# Patient Record
Sex: Male | Born: 1940 | Race: Black or African American | Hispanic: No | Marital: Married | Smoking: Former smoker
Health system: Southern US, Community
[De-identification: ages and names within clinical notes are randomized; demographics above are authoritative.]

## PROBLEM LIST (undated history)

## (undated) DIAGNOSIS — N1330 Unspecified hydronephrosis: Secondary | ICD-10-CM

## (undated) DIAGNOSIS — I10 Essential (primary) hypertension: Secondary | ICD-10-CM

## (undated) DIAGNOSIS — E215 Disorder of parathyroid gland, unspecified: Secondary | ICD-10-CM

## (undated) DIAGNOSIS — M109 Gout, unspecified: Secondary | ICD-10-CM

## (undated) DIAGNOSIS — R0600 Dyspnea, unspecified: Secondary | ICD-10-CM

## (undated) DIAGNOSIS — N189 Chronic kidney disease, unspecified: Secondary | ICD-10-CM

## (undated) DIAGNOSIS — C801 Malignant (primary) neoplasm, unspecified: Secondary | ICD-10-CM

## (undated) DIAGNOSIS — Z87442 Personal history of urinary calculi: Secondary | ICD-10-CM

## (undated) DIAGNOSIS — Z8546 Personal history of malignant neoplasm of prostate: Secondary | ICD-10-CM

## (undated) DIAGNOSIS — J45909 Unspecified asthma, uncomplicated: Secondary | ICD-10-CM

## (undated) DIAGNOSIS — D510 Vitamin B12 deficiency anemia due to intrinsic factor deficiency: Secondary | ICD-10-CM

## (undated) DIAGNOSIS — N35919 Unspecified urethral stricture, male, unspecified site: Secondary | ICD-10-CM

## (undated) DIAGNOSIS — K219 Gastro-esophageal reflux disease without esophagitis: Secondary | ICD-10-CM

## (undated) HISTORY — PX: MULTIPLE TOOTH EXTRACTIONS: SHX2053

## (undated) HISTORY — PX: KNEE SURGERY: SHX244

## (undated) HISTORY — PX: COLONOSCOPY: SHX5424

## (undated) HISTORY — PX: OTHER SURGICAL HISTORY: SHX169

---

## 1997-11-22 ENCOUNTER — Other Ambulatory Visit: Admission: RE | Admit: 1997-11-22 | Discharge: 1997-11-22 | Payer: Self-pay | Admitting: Urology

## 1998-01-04 HISTORY — PX: OTHER SURGICAL HISTORY: SHX169

## 1998-01-06 ENCOUNTER — Encounter: Admission: RE | Admit: 1998-01-06 | Discharge: 1998-04-06 | Payer: Self-pay | Admitting: Urology

## 1998-03-28 ENCOUNTER — Ambulatory Visit (HOSPITAL_BASED_OUTPATIENT_CLINIC_OR_DEPARTMENT_OTHER): Admission: RE | Admit: 1998-03-28 | Discharge: 1998-03-28 | Payer: Self-pay | Admitting: Urology

## 1998-05-02 ENCOUNTER — Ambulatory Visit (HOSPITAL_BASED_OUTPATIENT_CLINIC_OR_DEPARTMENT_OTHER): Admission: RE | Admit: 1998-05-02 | Discharge: 1998-05-02 | Payer: Self-pay | Admitting: Urology

## 1998-05-02 ENCOUNTER — Encounter: Payer: Self-pay | Admitting: Urology

## 1998-05-27 ENCOUNTER — Encounter: Admission: RE | Admit: 1998-05-27 | Discharge: 1998-06-05 | Payer: Self-pay | Admitting: Radiation Oncology

## 1998-05-27 ENCOUNTER — Encounter: Payer: Self-pay | Admitting: Urology

## 2000-08-26 ENCOUNTER — Emergency Department (HOSPITAL_COMMUNITY): Admission: EM | Admit: 2000-08-26 | Discharge: 2000-08-26 | Payer: Self-pay

## 2004-10-06 ENCOUNTER — Ambulatory Visit (HOSPITAL_COMMUNITY): Admission: RE | Admit: 2004-10-06 | Discharge: 2004-10-06 | Payer: Self-pay | Admitting: Gastroenterology

## 2007-09-14 ENCOUNTER — Encounter: Payer: Self-pay | Admitting: Urology

## 2007-09-14 ENCOUNTER — Ambulatory Visit (HOSPITAL_BASED_OUTPATIENT_CLINIC_OR_DEPARTMENT_OTHER): Admission: RE | Admit: 2007-09-14 | Discharge: 2007-09-14 | Payer: Self-pay | Admitting: Urology

## 2007-09-14 HISTORY — PX: OTHER SURGICAL HISTORY: SHX169

## 2010-05-19 NOTE — Op Note (Signed)
[name], Wise                 ACCOUNT NO.:  [id number]   MEDICAL RECORD NO.:  [id number]          PATIENT TYPE:  AMB   LOCATION:  [facility]                         FACILITY:  [facility]   PHYSICIAN:  Excell Seltzer. Annabell Howells, M.D.    DATE OF BIRTH:  11/08/1940   DATE OF PROCEDURE:  09/14/2007  DATE OF DISCHARGE:                               OPERATIVE REPORT   PROCEDURE:  1. Cystoscopy with balloon dilation of urethral stricture.  2. Bladder biopsy with fulguration.   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   POSTOPERATIVE DIAGNOSIS:  Urethral stricture with erythematous lesion on  the left lateral bladder wall.   SURGEON:  Excell Seltzer. Annabell Howells, M.D.   ANESTHESIA:  General.   SPECIMEN:  Bladder biopsies from left lateral wall.   DRAIN:  None.   COMPLICATIONS:  None.   INDICATIONS:  Jerry Wise is a 70 year old African American male with a  history of prostate cancer with prior seed implantation who has had  progressive voiding difficulties.  He has had a prior urethral stricture  that required management and recently was diagnosed with infection and  remains on amoxicillin.  Office evaluation suggested a possible mass at  the bladder neck that was either prostate versus bladder tumor.  It was  felt that cystoscopy, urethral dilation and possible bladder biopsy were  indicated.   FINDINGS AND PROCEDURE:  The patient had been on amoxicillin and was  given Cipro.  He was taken to the operating room where general  anesthetic was induced.  He was placed in the lithotomy position.  His  perineum and genitalia were prepped with Betadine solution.  He was  draped in the usual sterile fashion.  Cystoscopy was performed using a  22 [place] scope.  Examination revealed normal anterior urethra but in  the bulb there was a very tight stricture that appeared to be about 6  [place].  A sensor guidewire was passed through the stricture into the  bladder under fluoroscopic  guidance.  A 24 French 15 cm balloon dilation  catheter was then inserted over the wire across the stricture and the  balloon was inflated to 16 atmospheres.  The balloon was then deflated  and removed leaving the wire in place.  The 10 French scope was then  passed alongside the wire and through the stricture without difficulty.  However, the wire made passage somewhat snug so it was removed.  The  external sphincter was intact.  The prostatic urethra was short with  trilobar hyperplasia but without obstruction.  There was a middle lobe  which was likely the lesion seen on ultrasound.  Inspection of the  bladder demonstrated an erythematous lesion approximately 1 x 1.5 cm on  the right lateral wall that appeared consistent with cystitis possibly  related to his recent infection versus radiation cystitis that carcinoma  in situ could not be ruled out.  On visual inspection no other  significant lesions were noted.  The bladder wall had mild  trabeculation.  Ureteral orifices were unremarkable.  Once a thorough  inspection had been  performed with the 12 and 70 degree lenses a cup  biopsy forceps was used to take two biopsies from the area of concern on  the left lateral wall.  The biopsy sites were then fulgurated with a  Bugbee  electrode.  The patient's bladder was then drained.  The cystoscope was  removed.  He was taken down from lithotomy position.  His anesthetic was  reversed and he was removed to the recovery room in stable condition.  There were no complications.      Excell Seltzer. Annabell Howells, M.D.  Electronically Signed     JJW/MEDQ  D:  09/14/2007  T:  09/15/2007  Job:  [id number]

## 2010-05-22 NOTE — Op Note (Signed)
Jerry Wise                 ACCOUNT NO.:  [id number]   MEDICAL RECORD NO.:  [id number]          PATIENT TYPE:  AMB   LOCATION:  ENDO                         [facility]ACILITY:  [facility]   PHYSICIAN:  Danise Edge, M.D.   DATE OF BIRTH:  03-21-1940   DATE OF PROCEDURE:  10/06/2004  DATE OF DISCHARGE:                                 OPERATIVE REPORT   PROCEDURE:  Flexible proctosigmoidoscopy.   REFERRING PHYSICIAN:  Ladell Pier, M.D.   PROCEDURE INDICATIONS:  Jerry Wise is a 70 year old male born April 13, 1940. Jerry Wise was scheduled to undergo his first screening colonoscopy with  polypectomy to prevent colon cancer. Due to left colonic loop formation, a  flexible proctosigmoidoscopy was performed.   ENDOSCOPIST:  Danise Edge, M.D.   PREMEDICATION:  Versed 6 mg, Demerol 75 mg.   PROCEDURE:  After obtaining informed consent, Jerry Wise was placed in the  left lateral decubitus position. I administered intravenous Demerol and  intravenous Versed to achieve conscious sedation for the procedure. The  patient's blood pressure, oxygen saturation and cardiac rhythm were  monitored throughout the procedure and documented in the medical record.   Anal inspection was normal. Digital rectal exam reveals an absent prostate.  The Olympus adjustable pediatric colonoscope was introduced into the rectum  and advanced to 70 cm from the anal verge which appeared to be close to the  splenic flexure. Despite applying external abdominal pressure and  repositioning the patient from the left lateral decubitus position to the  supine position, the right lateral decubitus position and back to the supine  position, I was unable to advance the colonoscope around the splenic flexure  and into the transverse colon. A complete colonoscopy was not performed.   Endoscopic appearance of the rectum was normal. Endoscopic appearance of the  left colon with the endoscope advanced to 70 cm from the anal  verge was  completely normal.   ASSESSMENT:  Normal flexible proctocolonoscopy to 70 cm (near the splenic  flexure). No endoscopic evidence for the presence of colorectal neoplasia.   PLAN:  Air contrast barium enema to follow at the [facility] endoscopy  suite.           ______________________________  Danise Edge, M.D.     MJ/MEDQ  D:  10/06/2004  T:  10/06/2004  Job:  [id number]

## 2010-10-07 LAB — POCT I-STAT 4, (NA,K, GLUC, HGB,HCT)
HCT: 47
Hemoglobin: 16
Sodium: 139

## 2011-10-28 ENCOUNTER — Other Ambulatory Visit: Payer: Self-pay | Admitting: Gastroenterology

## 2011-10-28 DIAGNOSIS — D510 Vitamin B12 deficiency anemia due to intrinsic factor deficiency: Secondary | ICD-10-CM

## 2011-12-15 ENCOUNTER — Other Ambulatory Visit: Payer: Self-pay

## 2011-12-17 ENCOUNTER — Ambulatory Visit
Admission: RE | Admit: 2011-12-17 | Discharge: 2011-12-17 | Disposition: A | Discharge status: Home / Self Care | Payer: Medicare Other | Source: Ambulatory Visit | Attending: Gastroenterology | Admitting: Gastroenterology

## 2011-12-17 ENCOUNTER — Other Ambulatory Visit: Payer: Self-pay | Admitting: Gastroenterology

## 2011-12-17 DIAGNOSIS — D510 Vitamin B12 deficiency anemia due to intrinsic factor deficiency: Secondary | ICD-10-CM

## 2012-02-15 ENCOUNTER — Other Ambulatory Visit: Payer: Self-pay | Admitting: Internal Medicine

## 2012-02-15 DIAGNOSIS — D509 Iron deficiency anemia, unspecified: Secondary | ICD-10-CM

## 2012-02-29 ENCOUNTER — Other Ambulatory Visit: Payer: Medicare Other

## 2012-03-09 ENCOUNTER — Other Ambulatory Visit (HOSPITAL_COMMUNITY): Payer: Self-pay | Admitting: *Deleted

## 2012-03-10 ENCOUNTER — Inpatient Hospital Stay (HOSPITAL_COMMUNITY): Admission: RE | Admit: 2012-03-10 | Payer: Medicare Other | Source: Ambulatory Visit

## 2012-03-14 ENCOUNTER — Encounter (HOSPITAL_COMMUNITY)
Admission: RE | Admit: 2012-03-14 | Discharge: 2012-03-14 | Disposition: A | Discharge status: Home / Self Care | Payer: Medicare Other | Source: Ambulatory Visit | Attending: Nephrology | Admitting: Nephrology

## 2012-03-14 DIAGNOSIS — D510 Vitamin B12 deficiency anemia due to intrinsic factor deficiency: Secondary | ICD-10-CM | POA: Insufficient documentation

## 2012-03-14 MED ORDER — FERUMOXYTOL INJECTION 510 MG/17 ML
510.0000 mg | INTRAVENOUS | Status: DC
  Administered 2012-03-14: 510 mg via INTRAVENOUS

## 2012-03-14 MED ORDER — SODIUM CHLORIDE 0.9 % IV SOLN
INTRAVENOUS | Status: DC
  Administered 2012-03-14: 11:00:00 via INTRAVENOUS

## 2012-03-21 ENCOUNTER — Other Ambulatory Visit: Payer: Self-pay | Admitting: Nephrology

## 2012-03-21 ENCOUNTER — Other Ambulatory Visit (HOSPITAL_COMMUNITY): Payer: Self-pay | Admitting: *Deleted

## 2012-03-22 ENCOUNTER — Encounter (HOSPITAL_COMMUNITY)
Admission: RE | Admit: 2012-03-22 | Discharge: 2012-03-22 | Disposition: A | Discharge status: Home / Self Care | Payer: Medicare Other | Source: Ambulatory Visit | Attending: Nephrology | Admitting: Nephrology

## 2012-03-22 LAB — IRON AND TIBC
Iron: 99 ug/dL (ref 42–135)
Saturation Ratios: 22 % (ref 20–55)

## 2012-03-22 MED ORDER — FERUMOXYTOL INJECTION 510 MG/17 ML
510.0000 mg | INTRAVENOUS | Status: AC
  Administered 2012-03-22: 510 mg via INTRAVENOUS

## 2012-03-22 MED ORDER — SODIUM CHLORIDE 0.9 % IV SOLN
INTRAVENOUS | Status: AC
  Administered 2012-03-22: 10:00:00 via INTRAVENOUS

## 2012-03-22 MED ORDER — FERUMOXYTOL INJECTION 510 MG/17 ML
INTRAVENOUS | Status: AC
  Administered 2012-03-22: 510 mg via INTRAVENOUS
  Filled 2012-03-22: qty 17

## 2012-03-23 ENCOUNTER — Ambulatory Visit
Admission: RE | Admit: 2012-03-23 | Discharge: 2012-03-23 | Disposition: A | Discharge status: Home / Self Care | Payer: Medicare Other | Source: Ambulatory Visit | Attending: Nephrology | Admitting: Nephrology

## 2012-03-27 ENCOUNTER — Other Ambulatory Visit (HOSPITAL_COMMUNITY): Payer: Self-pay | Admitting: Nephrology

## 2012-03-27 DIAGNOSIS — D351 Benign neoplasm of parathyroid gland: Secondary | ICD-10-CM

## 2012-03-28 ENCOUNTER — Other Ambulatory Visit: Payer: Self-pay | Admitting: Urology

## 2012-03-30 ENCOUNTER — Encounter (INDEPENDENT_AMBULATORY_CARE_PROVIDER_SITE_OTHER): Payer: Self-pay

## 2012-03-30 ENCOUNTER — Encounter (HOSPITAL_BASED_OUTPATIENT_CLINIC_OR_DEPARTMENT_OTHER): Payer: Self-pay | Admitting: *Deleted

## 2012-04-03 ENCOUNTER — Encounter (HOSPITAL_COMMUNITY)
Admission: RE | Admit: 2012-04-03 | Discharge: 2012-04-03 | Disposition: A | Discharge status: Home / Self Care | Payer: Medicare Other | Source: Ambulatory Visit | Attending: Nephrology | Admitting: Nephrology

## 2012-04-03 ENCOUNTER — Encounter (HOSPITAL_BASED_OUTPATIENT_CLINIC_OR_DEPARTMENT_OTHER): Payer: Self-pay | Admitting: *Deleted

## 2012-04-03 DIAGNOSIS — D351 Benign neoplasm of parathyroid gland: Secondary | ICD-10-CM | POA: Insufficient documentation

## 2012-04-03 DIAGNOSIS — R748 Abnormal levels of other serum enzymes: Secondary | ICD-10-CM | POA: Insufficient documentation

## 2012-04-03 MED ORDER — TECHNETIUM TC 99M SESTAMIBI GENERIC - CARDIOLITE
25.0000 | Freq: Once | INTRAVENOUS | Status: AC | PRN
  Administered 2012-04-03: 25 via INTRAVENOUS

## 2012-04-03 NOTE — Progress Notes (Signed)
SPOKE W/ PT WIFE, WANDA. NPO AFTER MN. NEEDS ISTAT AND EKG. PT WIFE TO CALL BACK AFTER 1300 04-04-2012 WITH LIST OF MEDICATIONS. AT THAT TIME WILL GET INSTRUCTIONS IF ANY MED. TO TAKE AM OF SURG.

## 2012-04-04 NOTE — Progress Notes (Signed)
Wife called with medication list-instructed her to have husband take atenolol,losartan,amlodipine with small amt water am of procedure.

## 2012-04-05 NOTE — H&P (Signed)
ctive Problems Problems  1. Bladder Disorders 596.9 2. 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 Hydronephrosis Bilateral 591 4. 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 Urinary Frequency 788.41 9. Urinary Tract Infection 599.0  History of Present Illness  Jerry Wise returns today in consultation from Dr. Hyman Hopes for progressive renal insufficiency with a Cr of about 2.7 and a recent renal US that showed bilateral hydro.  He has a history of prostate cancer treated in 2000 with seeds.  His PSA in 2010 was undetectible.  He has had a urethral stricture that has required dilation.  He is voiding ok, but reports a chronically slow stream.  He has no dysuria.  He was treated for a UTI by Dr. Kevan Ny about a year ago.  He still has an odor at times and the urine looks infected today.  He has had no hematuria.  He has had no flank pain.   Past Medical History Problems  1. History of  Arthritis V13.4 2. History of  Chronic Renal Insufficiency 585.9 3. History of  Esophageal Reflux 530.81 4. History of  Gout 274.9 5. History of  Gross Hematuria 599.71 6. History of  Hypercalcemia 275.42 7. History of  Hypercholesterolemia 272.0 8. History of  Hypertension 401.9 9. History of  Prostate Cancer V10.46 10. 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 History of  Urethral Stricture 598.9  Surgical History Problems  1. History of  Cystoscopy For Urethral Stricture 2. History of  Cystoscopy With Biopsy 3. History of  Hernia Repair 4. History of  Knee Surgery 5. History of  Prostate Surgery  Current Meds 1. Allopurinol 300 MG Oral Tablet; Therapy: 01Apr2013 to 2. AmLODIPine Besylate 10 MG Oral Tablet; Therapy: 13Oct2013 to 3. Atenolol 100 MG Oral Tablet; Therapy: (Recorded:17Sep2008) to 4. Hydrochlorothiazide 12.5 MG Oral Capsule; Therapy: 22Nov2013 to 5. Iron TABS; Therapy: (Recorded:17Sep2008) to 6.  Losartan Potassium 100 MG Oral Tablet; Therapy: 13Nov2013 to 7. Multi-Vitamin TABS; Therapy: (Recorded:17Sep2008) to 8. Omeprazole 20 MG Oral Capsule Delayed Release; Therapy: (Recorded:26Oct2010) to 9. Vitamin D TABS; Therapy: (Recorded:17Sep2008) to  Allergies No Known Allergies  1. No Known Allergies  Family History Problems  1. Paternal history of  Acute Myocardial Infarction V17.3 2. Paternal history of  Death In The Family Father 3. Family history of  Family Health Status Number Of Children 4. Paternal uncle's history of  Tuberculosis  Social History Problems    Alcohol Use   Caffeine Use   Marital History - Currently Married   Occupation:   Tobacco Use V15.82  Review of Systems Genitourinary, constitutional, skin, eye, otolaryngeal, hematologic/lymphatic, cardiovascular, pulmonary, endocrine, musculoskeletal, gastrointestinal, neurological and psychiatric system(s) were reviewed and pertinent findings if present are noted.  Genitourinary: urinary frequency, feelings of urinary urgency, nocturia (q1hr), incontinence and difficulty starting the urinary stream, but no hematuria.  Gastrointestinal: heartburn.  Constitutional: night sweats and recent weight loss.  Integumentary: pruritus.  ENT: sinus problems.  Cardiovascular: leg swelling (when he has gout. ).  Respiratory: shortness of breath and cough.  Musculoskeletal: back pain and joint pain.    Vitals Vital Signs [Data Includes: Last 1 Day]  19Mar2014 03:04PM  BMI Calculated: 23.49 BSA Calculated: 2.11 Height: 6 ft 2.5 in Weight: 185 lb  Blood Pressure: 144 / 76 Temperature: 97.8 F Heart Rate: 69  Physical Exam Constitutional: Well nourished and well developed . No acute distress.  ENT:. The ears and nose are normal in appearance.  Neck: The appearance of the neck is normal and no neck mass is present.  Pulmonary: No respiratory distress and normal respiratory rhythm and effort.  Cardiovascular: Heart  rate and rhythm are normal . No peripheral edema.  Abdomen: The abdomen is soft and nontender. No masses are palpated. No CVA tenderness. No hernias are palpable. No hepatosplenomegaly noted.  Rectal: Rectal exam demonstrates normal sphincter tone, no tenderness and no masses. The prostate is smooth and flat. The prostate has no nodularity and is not tender. The left seminal vesicle is nonpalpable. The right seminal vesicle is nonpalpable. The perineum is normal on inspection.  Genitourinary: Examination of the penis demonstrates no discharge, no masses, no lesions and a normal meatus. The penis is circumcised. The scrotum is without lesions. The right epididymis is palpably normal and non-tender. The left epididymis is palpably normal and non-tender. The right testis is non-tender and without masses. The left testis is non-tender and without masses.  Lymphatics: The supraclavicular, axillary, femoral and inguinal nodes are not enlarged or tender.  Skin: Normal skin turgor, no visible rash and no visible skin lesions.  Neuro/Psych:. Mood and affect are appropriate. No motor deficits.    Results/Data Urine [Data Includes: Last 1 Day]   19Mar2014  COLOR YELLOW   APPEARANCE TURBID   SPECIFIC GRAVITY 1.020   pH 6.0   GLUCOSE NEG mg/dL  BILIRUBIN NEG   KETONE NEG mg/dL  BLOOD LARGE   PROTEIN 100 mg/dL  UROBILINOGEN 0.2 mg/dL  NITRITE POS   LEUKOCYTE ESTERASE LARGE   SQUAMOUS EPITHELIAL/HPF RARE   WBC TNTC WBC/hpf  RBC 21-50 RBC/hpf  BACTERIA MANY   CRYSTALS NONE SEEN   CASTS NONE SEEN    Old records or history reviewed: I have reviewed office notes and labs from Dr. Hyman Hopes. The renal US report was not sent.  The following images/tracing/specimen were independently visualized:  CT urogram today demonstrates bilateral hydronephrosis to the bladder with some bladder wall thickening and increased residual. seeds surround the prostate. See full report for details.    Assessment Assessed  1.  History of  Prostate Cancer V10.46 2. 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 Hydronephrosis Bilateral 591 4. Incomplete Emptying Of Bladder 788.21 5. 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   He has chronic cystitis and the urine looks infected today.   Plan  Chronic Cystitis (595.2)  1. Cephalexin 500 MG Oral Capsule; TAKE 1 CAPSULE EVERY 6 HOURS DAILY; Therapy:  19Mar2014 to (Evaluate:26Mar2014); Last Rx:19Mar2014 Health Maintenance (V70.0)  2. UA With REFLEX  Done: 19Mar2014 02:39PM Hydronephrosis (591)  3. AU CT-STONE PROTOCOL  Done: 19Mar2014 12:00AM 4. Follow-up Schedule Surgery Office  Follow-up  Requested for: 19Mar2014 PMH: Prostate Cancer (V10.46)  5. Allopurinol 100 MG Oral Tablet; Status: DISCONTINUED 6. PSA  Requested for: 19Mar2014   Urine culture and keflex pending the culture. I will check a PSA today. He will be set up for outpatient cystoscopy with possible urethral dilation, possible TURP, bilateral retrogrades with possible stenting.  The risks of bleeding, infection, injury to the bladder or ureters, need for stents, incontinence, recurrent strictures, thrombotic events and anesthetic complications discussed.  We will shoot for next week to give him time on antibiotics.    URINE CULTURE  Status: In Progress - Specimen/Data Collected  Done: 19Mar2014 Ordered Today; For: Chronic Cystitis (595.2); Ordered By: Elliot Gault  Due: 21Mar2014 Marked Important;  Last Updated By: Thomasenia Sales   Discussion/Summary  CC: Dr. Elvis Coil.

## 2012-04-06 ENCOUNTER — Ambulatory Visit (HOSPITAL_BASED_OUTPATIENT_CLINIC_OR_DEPARTMENT_OTHER): Payer: Medicare Other | Admitting: Anesthesiology

## 2012-04-06 ENCOUNTER — Encounter (HOSPITAL_BASED_OUTPATIENT_CLINIC_OR_DEPARTMENT_OTHER): Payer: Self-pay | Admitting: Anesthesiology

## 2012-04-06 ENCOUNTER — Other Ambulatory Visit: Payer: Self-pay

## 2012-04-06 ENCOUNTER — Encounter (HOSPITAL_BASED_OUTPATIENT_CLINIC_OR_DEPARTMENT_OTHER)
Admission: RE | Disposition: A | Discharge status: Home / Self Care | Payer: Self-pay | Source: Ambulatory Visit | Attending: Urology

## 2012-04-06 ENCOUNTER — Ambulatory Visit (HOSPITAL_BASED_OUTPATIENT_CLINIC_OR_DEPARTMENT_OTHER)
Admission: RE | Admit: 2012-04-06 | Discharge: 2012-04-06 | Disposition: A | Discharge status: Home / Self Care | Payer: Medicare Other | Source: Ambulatory Visit | Attending: Urology | Admitting: Urology

## 2012-04-06 DIAGNOSIS — N3941 Urge incontinence: Secondary | ICD-10-CM | POA: Insufficient documentation

## 2012-04-06 DIAGNOSIS — N3020 Other chronic cystitis without hematuria: Secondary | ICD-10-CM | POA: Insufficient documentation

## 2012-04-06 DIAGNOSIS — R350 Frequency of micturition: Secondary | ICD-10-CM | POA: Insufficient documentation

## 2012-04-06 DIAGNOSIS — N390 Urinary tract infection, site not specified: Secondary | ICD-10-CM | POA: Insufficient documentation

## 2012-04-06 DIAGNOSIS — N289 Disorder of kidney and ureter, unspecified: Secondary | ICD-10-CM | POA: Insufficient documentation

## 2012-04-06 DIAGNOSIS — N529 Male erectile dysfunction, unspecified: Secondary | ICD-10-CM | POA: Insufficient documentation

## 2012-04-06 DIAGNOSIS — C61 Malignant neoplasm of prostate: Secondary | ICD-10-CM | POA: Insufficient documentation

## 2012-04-06 DIAGNOSIS — K219 Gastro-esophageal reflux disease without esophagitis: Secondary | ICD-10-CM | POA: Insufficient documentation

## 2012-04-06 DIAGNOSIS — E7800 Pure hypercholesterolemia, unspecified: Secondary | ICD-10-CM | POA: Insufficient documentation

## 2012-04-06 DIAGNOSIS — N1330 Unspecified hydronephrosis: Secondary | ICD-10-CM | POA: Insufficient documentation

## 2012-04-06 DIAGNOSIS — N320 Bladder-neck obstruction: Secondary | ICD-10-CM | POA: Insufficient documentation

## 2012-04-06 DIAGNOSIS — R339 Retention of urine, unspecified: Secondary | ICD-10-CM | POA: Insufficient documentation

## 2012-04-06 DIAGNOSIS — Z79899 Other long term (current) drug therapy: Secondary | ICD-10-CM | POA: Insufficient documentation

## 2012-04-06 DIAGNOSIS — I10 Essential (primary) hypertension: Secondary | ICD-10-CM | POA: Insufficient documentation

## 2012-04-06 DIAGNOSIS — N35919 Unspecified urethral stricture, male, unspecified site: Secondary | ICD-10-CM | POA: Insufficient documentation

## 2012-04-06 DIAGNOSIS — N1370 Vesicoureteral-reflux, unspecified: Secondary | ICD-10-CM | POA: Insufficient documentation

## 2012-04-06 HISTORY — DX: Gout, unspecified: M10.9

## 2012-04-06 HISTORY — DX: Personal history of malignant neoplasm of prostate: Z85.46

## 2012-04-06 HISTORY — DX: Vitamin B12 deficiency anemia due to intrinsic factor deficiency: D51.0

## 2012-04-06 HISTORY — DX: Unspecified hydronephrosis: N13.30

## 2012-04-06 HISTORY — DX: Gastro-esophageal reflux disease without esophagitis: K21.9

## 2012-04-06 HISTORY — DX: Disorder of parathyroid gland, unspecified: E21.5

## 2012-04-06 HISTORY — DX: Chronic kidney disease, unspecified: N18.9

## 2012-04-06 HISTORY — DX: Unspecified urethral stricture, male, unspecified site: N35.919

## 2012-04-06 HISTORY — PX: CYSTOSCOPY W/ URETERAL STENT PLACEMENT: SHX1429

## 2012-04-06 LAB — POCT I-STAT 4, (NA,K, GLUC, HGB,HCT)
Hemoglobin: 13.9 g/dL (ref 13.0–17.0)
Sodium: 141 mEq/L (ref 135–145)

## 2012-04-06 SURGERY — CYSTOSCOPY, WITH RETROGRADE PYELOGRAM AND URETERAL STENT INSERTION
Anesthesia: General | Site: Bladder | Wound class: Clean Contaminated

## 2012-04-06 MED ORDER — SODIUM CHLORIDE 0.9 % IV SOLN
250.0000 mL | INTRAVENOUS | Status: DC | PRN
  Filled 2012-04-06: qty 250

## 2012-04-06 MED ORDER — CIPROFLOXACIN IN D5W 400 MG/200ML IV SOLN
400.0000 mg | INTRAVENOUS | Status: AC
  Administered 2012-04-06: 400 mg via INTRAVENOUS
  Filled 2012-04-06: qty 200

## 2012-04-06 MED ORDER — SODIUM CHLORIDE 0.9 % IJ SOLN
3.0000 mL | INTRAMUSCULAR | Status: DC | PRN
  Filled 2012-04-06: qty 3

## 2012-04-06 MED ORDER — LACTATED RINGERS IV SOLN
INTRAVENOUS | Status: DC
  Filled 2012-04-06: qty 1000

## 2012-04-06 MED ORDER — IOHEXOL 350 MG/ML SOLN
INTRAVENOUS | Status: DC | PRN
  Administered 2012-04-06: 50 mL via URETHRAL

## 2012-04-06 MED ORDER — DIATRIZOATE MEGLUMINE 30 % UR SOLN
URETHRAL | Status: DC | PRN
  Administered 2012-04-06: 300 mL

## 2012-04-06 MED ORDER — BELLADONNA ALKALOIDS-OPIUM 16.2-60 MG RE SUPP
RECTAL | Status: DC | PRN
  Administered 2012-04-06: 1 via RECTAL

## 2012-04-06 MED ORDER — STERILE WATER FOR IRRIGATION IR SOLN
Status: DC | PRN
  Administered 2012-04-06: 3000 mL

## 2012-04-06 MED ORDER — ACETAMINOPHEN 650 MG RE SUPP
650.0000 mg | RECTAL | Status: DC | PRN
  Filled 2012-04-06: qty 1

## 2012-04-06 MED ORDER — ONDANSETRON HCL 4 MG/2ML IJ SOLN
INTRAMUSCULAR | Status: DC | PRN
  Administered 2012-04-06: 4 mg via INTRAVENOUS

## 2012-04-06 MED ORDER — HYDROCODONE-ACETAMINOPHEN 5-325 MG PO TABS: 1.0000 | ORAL_TABLET | Freq: Four times a day (QID) | ORAL | Status: DC | PRN

## 2012-04-06 MED ORDER — FENTANYL CITRATE 0.05 MG/ML IJ SOLN
INTRAMUSCULAR | Status: DC | PRN
  Administered 2012-04-06: 50 ug via INTRAVENOUS
  Administered 2012-04-06 (×4): 25 ug via INTRAVENOUS

## 2012-04-06 MED ORDER — ACETAMINOPHEN 325 MG PO TABS
650.0000 mg | ORAL_TABLET | ORAL | Status: DC | PRN
  Filled 2012-04-06: qty 2

## 2012-04-06 MED ORDER — LACTATED RINGERS IV SOLN
INTRAVENOUS | Status: DC
  Administered 2012-04-06 (×2): via INTRAVENOUS
  Filled 2012-04-06: qty 1000

## 2012-04-06 MED ORDER — CEPHALEXIN 500 MG PO CAPS: 500.0000 mg | ORAL_CAPSULE | Freq: Three times a day (TID) | ORAL | Status: DC

## 2012-04-06 MED ORDER — SODIUM CHLORIDE 0.9 % IR SOLN
Status: DC | PRN
  Administered 2012-04-06: 6000 mL via INTRAVESICAL

## 2012-04-06 MED ORDER — MORPHINE SULFATE 2 MG/ML IJ SOLN
1.0000 mg | INTRAMUSCULAR | Status: DC | PRN
  Filled 2012-04-06: qty 1

## 2012-04-06 MED ORDER — PROPOFOL 10 MG/ML IV BOLUS
INTRAVENOUS | Status: DC | PRN
  Administered 2012-04-06: 200 mg via INTRAVENOUS

## 2012-04-06 MED ORDER — SODIUM CHLORIDE 0.9 % IJ SOLN
3.0000 mL | Freq: Two times a day (BID) | INTRAMUSCULAR | Status: DC
  Filled 2012-04-06: qty 3

## 2012-04-06 MED ORDER — LIDOCAINE HCL (CARDIAC) 20 MG/ML IV SOLN
INTRAVENOUS | Status: DC | PRN
  Administered 2012-04-06: 50 mg via INTRAVENOUS
  Administered 2012-04-06: 20 mg via INTRAVENOUS

## 2012-04-06 MED ORDER — PHENAZOPYRIDINE HCL 200 MG PO TABS: 200.0000 mg | ORAL_TABLET | Freq: Three times a day (TID) | ORAL | Status: DC | PRN

## 2012-04-06 MED ORDER — FENTANYL CITRATE 0.05 MG/ML IJ SOLN
25.0000 ug | INTRAMUSCULAR | Status: DC | PRN
  Filled 2012-04-06: qty 1

## 2012-04-06 MED ORDER — OXYCODONE HCL 5 MG PO TABS
5.0000 mg | ORAL_TABLET | ORAL | Status: DC | PRN
  Filled 2012-04-06: qty 2

## 2012-04-06 MED ORDER — ONDANSETRON HCL 4 MG/2ML IJ SOLN
4.0000 mg | Freq: Four times a day (QID) | INTRAMUSCULAR | Status: DC | PRN
  Filled 2012-04-06: qty 2

## 2012-04-06 MED ORDER — PROMETHAZINE HCL 25 MG/ML IJ SOLN
6.2500 mg | INTRAMUSCULAR | Status: DC | PRN
  Filled 2012-04-06: qty 1

## 2012-04-06 MED ORDER — DEXAMETHASONE SODIUM PHOSPHATE 4 MG/ML IJ SOLN
INTRAMUSCULAR | Status: DC | PRN
  Administered 2012-04-06: 8 mg via INTRAVENOUS

## 2012-04-06 SURGICAL SUPPLY — 39 items
BAG DRAIN URO-CYSTO SKYTR STRL (DRAIN) ×3 IMPLANT
BAG URINE DRAINAGE (UROLOGICAL SUPPLIES) ×3 IMPLANT
BAG URINE LEG 19OZ MD ST LTX (BAG) IMPLANT
BALLN NEPHROSTOMY (BALLOONS) ×3
BALLOON NEPHROSTOMY (BALLOONS) ×2 IMPLANT
CANISTER SUCT LVC 12 LTR MEDI- (MISCELLANEOUS) IMPLANT
CATH FOLEY 2WAY SLVR  5CC 20FR (CATHETERS)
CATH FOLEY 2WAY SLVR  5CC 22FR (CATHETERS)
CATH FOLEY 2WAY SLVR 5CC 20FR (CATHETERS) IMPLANT
CATH FOLEY 2WAY SLVR 5CC 22FR (CATHETERS) IMPLANT
CATH HEMA 3WAY 30CC 24FR COUDE (CATHETERS) IMPLANT
CATH HEMA 3WAY 30CC 24FR RND (CATHETERS) IMPLANT
CATH SILASTIC FOLEY 20FRX30CC (CATHETERS) ×3 IMPLANT
CATH URET 5FR 28IN CONE TIP (BALLOONS)
CATH URET 5FR 28IN OPEN ENDED (CATHETERS) ×3 IMPLANT
CATH URET 5FR 70CM CONE TIP (BALLOONS) IMPLANT
CLOTH BEACON ORANGE TIMEOUT ST (SAFETY) ×3 IMPLANT
DRAPE CAMERA CLOSED 9X96 (DRAPES) ×3 IMPLANT
ELECT LOOP HF 26F 30D .35MM (CUTTING LOOP) IMPLANT
ELECT LOOP MED HF 24F 12D (CUTTING LOOP) ×3 IMPLANT
ELECT LOOP MED HF 24F 12D CBL (CLIP) ×3 IMPLANT
ELECT NEEDLE 45D HF 24-28F 12D (CUTTING LOOP) IMPLANT
ELECT REM PT RETURN 9FT ADLT (ELECTROSURGICAL) ×3
ELECT RESECT VAPORIZE 12D CBL (ELECTRODE) ×3 IMPLANT
ELECTRODE REM PT RTRN 9FT ADLT (ELECTROSURGICAL) ×2 IMPLANT
GLOVE BIO SURGEON STRL SZ7 (GLOVE) ×3 IMPLANT
GLOVE SURG SS PI 8.0 STRL IVOR (GLOVE) ×3 IMPLANT
GOWN PREVENTION PLUS LG XLONG (DISPOSABLE) ×3 IMPLANT
GOWN STRL REIN XL XLG (GOWN DISPOSABLE) ×3 IMPLANT
GUIDEWIRE 0.038 PTFE COATED (WIRE) IMPLANT
GUIDEWIRE ANG ZIPWIRE 038X150 (WIRE) IMPLANT
GUIDEWIRE STR DUAL SENSOR (WIRE) ×3 IMPLANT
HOLDER FOLEY CATH W/STRAP (MISCELLANEOUS) IMPLANT
KIT ASPIRATION TUBING (SET/KITS/TRAYS/PACK) IMPLANT
LOOP CUTTING 24FR OLYMPUS (CUTTING LOOP) IMPLANT
NS IRRIG 500ML POUR BTL (IV SOLUTION) IMPLANT
PACK CYSTOSCOPY (CUSTOM PROCEDURE TRAY) ×3 IMPLANT
PLUG CATH AND CAP STER (CATHETERS) IMPLANT
SET ASPIRATION TUBING (TUBING) IMPLANT

## 2012-04-06 NOTE — Anesthesia Procedure Notes (Signed)
Procedure Name: LMA Insertion Date/Time: 04/06/2012 9:04 AM Performed by: Norva Pavlov Pre-anesthesia Checklist: Patient identified, Emergency Drugs available, Suction available and Patient being monitored Patient Re-evaluated:Patient Re-evaluated prior to inductionOxygen Delivery Method: Circle System Utilized Preoxygenation: Pre-oxygenation with 100% oxygen Intubation Type: IV induction Ventilation: Mask ventilation without difficulty LMA: LMA inserted LMA Size: 5.0 Number of attempts: 1 Airway Equipment and Method: bite block Placement Confirmation: positive ETCO2 Tube secured with: Tape Dental Injury: Teeth and Oropharynx as per pre-operative assessment

## 2012-04-06 NOTE — Brief Op Note (Signed)
04/06/2012  9:56 AM  PATIENT:  Jerry Wise  72 y.o. 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  Bladder neck bleeding.  Right reflux.  PROCEDURE:  Procedure(s): CYSTOSCOPY Balloon DILATION OF STRICTURE, Fulgeration Bladder Neck, Cystogram (N/A)  SURGEON:  Surgeon(s) and Role:    * Anner Crete, MD - Primary  PHYSICIAN ASSISTANT:   ASSISTANTS: none   ANESTHESIA:   general  EBL:  Total I/O In: 900 [I.V.:900] Out: -   BLOOD ADMINISTERED:none  DRAINS: Urinary Catheter (Foley)   LOCAL MEDICATIONS USED:  NONE  SPECIMEN:  No Specimen  DISPOSITION OF SPECIMEN:  N/A  COUNTS:  YES  TOURNIQUET:  * No tourniquets in log *  DICTATION: .Other Dictation: Dictation Number [id number]  PLAN OF CARE: Discharge to home after PACU  PATIENT DISPOSITION:  PACU - hemodynamically stable.   Delay start of Pharmacological VTE agent (>24hrs) due to surgical blood loss or risk of bleeding: not applicable

## 2012-04-06 NOTE — Anesthesia Postprocedure Evaluation (Signed)
Anesthesia Post Note  Patient: Jerry Wise  Procedure(s) Performed: Procedure(s) (LRB): CYSTOSCOPY Balloon DILATION OF STRICTURE, Fulgeration Bladder Neck, Cystogram (N/A)  Anesthesia type: General  Patient location: PACU  Post pain: Pain level controlled  Post assessment: Post-op Vital signs reviewed  Last Vitals:  Filed Vitals:   04/06/12 0841  BP: 140/74  Pulse: 64  Temp: 36.2 C  Resp: 20    Post vital signs: Reviewed  Level of consciousness: sedated  Complications: No apparent anesthesia complications

## 2012-04-06 NOTE — Anesthesia Preprocedure Evaluation (Signed)
Anesthesia Evaluation  Patient identified by MRN, date of birth, ID band Patient awake    Reviewed: Allergy & Precautions, H&P , NPO status , Patient's Chart, lab work & pertinent test results  Airway Mallampati: II TM Distance: >3 FB Neck ROM: Full    Dental  (+) Edentulous Upper and Edentulous Lower   Pulmonary neg pulmonary ROS,  breath sounds clear to auscultation  Pulmonary exam normal       Cardiovascular hypertension, Pt. on home beta blockers Rhythm:Regular Rate:Normal     Neuro/Psych negative neurological ROS  negative psych ROS   GI/Hepatic negative GI ROS, Neg liver ROS, GERD-  Medicated,  Endo/Other  negative endocrine ROS  Renal/GU Renal diseasenegative Renal ROS  negative genitourinary   Musculoskeletal negative musculoskeletal ROS (+)   Abdominal   Peds  Hematology negative hematology ROS (+)   Anesthesia Other Findings   Reproductive/Obstetrics                           Anesthesia Physical Anesthesia Plan  ASA: II  Anesthesia Plan: General   Post-op Pain Management:    Induction: Intravenous  Airway Management Planned: LMA  Additional Equipment:   Intra-op Plan:   Post-operative Plan: Extubation in OR  Informed Consent: I have reviewed the patients History and Physical, chart, labs and discussed the procedure including the risks, benefits and alternatives for the proposed anesthesia with the patient or authorized representative who has indicated his/her understanding and acceptance.   Dental advisory given  Plan Discussed with: CRNA  Anesthesia Plan Comments:         Anesthesia Quick Evaluation

## 2012-04-06 NOTE — Transfer of Care (Signed)
Immediate Anesthesia Transfer of Care Note  Patient: Jerry Wise  Procedure(s) Performed: Procedure(s) (LRB): CYSTOSCOPY Balloon DILATION OF STRICTURE, Fulgeration Bladder Neck, Cystogram (N/A)  Patient Location: PACU  Anesthesia Type: General  Level of Consciousness: awake, alert  and oriented  Airway & Oxygen Therapy: Patient Spontanous Breathing and Patient connected to face mask oxygen  Post-op Assessment: Report given to PACU RN and Post -op Vital signs reviewed and stable  Post vital signs: Reviewed and stable  Complications: No apparent anesthesia complications

## 2012-04-06 NOTE — Interval H&P Note (Signed)
History and Physical Interval Note:  04/06/2012 8:52 AM  Jerry Wise  has presented today for surgery, with the diagnosis of bilateral hydroneuphrosis with outlet obstruction  The various methods of treatment have been discussed with the patient and family. After consideration of risks, benefits and other options for treatment, the patient has consented to  Procedure(s): CYSTOSCOPY/DILATION OF STRICTURE/WITH BILATERAL RETROGRADE POSSIBLE STENTS PLACEMENT (Bilateral) TRANSURETHRAL RESECTION OF THE PROSTATE WITH GYRUS INSTRUMENTS (N/A) as a surgical intervention .  The patient's history has been reviewed, patient examined, no change in status, stable for surgery.  I have reviewed the patient's chart and labs.  Questions were answered to the patient's satisfaction.     Kendle Erker J

## 2012-04-07 ENCOUNTER — Encounter (HOSPITAL_BASED_OUTPATIENT_CLINIC_OR_DEPARTMENT_OTHER): Payer: Self-pay | Admitting: Urology

## 2012-04-07 NOTE — Op Note (Signed)
NAMEBRAYEN, BUNN                 ACCOUNT NO.:  [id number]  MEDICAL RECORD NO.:  [id number]  LOCATION:                                FACILITY:  [facility]  PHYSICIAN:  Excell Seltzer. Annabell Howells, M.D.    DATE OF BIRTH:  [birth year]  DATE OF PROCEDURE:  04/06/2012 DATE OF DISCHARGE:                              OPERATIVE REPORT   PREOPERATIVE DIAGNOSIS:  Bladder outlet obstruction with bilateral hydronephrosis and renal insufficiency.  POSTOPERATIVE DIAGNOSIS:  Bladder outlet obstruction with bilateral hydronephrosis and renal insufficiency with a membranous urethral stricture with associated bladder neck bleeding, right ureteral reflux and chronic cystitis.  PROCEDURE: 1. Cystoscopy with balloon dilation of urethral stricture. 2. Fulguration of bladder neck. 3. Cystogram.  SURGEON:  Excell Seltzer. Annabell Howells, M.D.  ANESTHESIA:  General.  SPECIMEN:  None.  DRAINS:  A 20-French Foley catheter.  COMPLICATIONS:  None.  INDICATIONS:  Mr. Aument is a 72 year old white male with a history of prostate cancer treated with seeds remotely.  His PSA remains undetectable, but he was referred back recently by nephrology for a creatinine of 2.7 and marked bilateral hydronephrosis.  On further evaluation, he was found to have a thick wall bladder with an elevated PVR and it was felt that he would either have urethral stricture or prostatic outlet obstruction.  It was felt that endoscopy with dilation versus TUR and bilateral retrogrades is indicated.  He had a preoperative urinary tract infection and was placed on Keflex best on culture.  FINDINGS OF PROCEDURE:  He was taken to the operating room where general anesthetic was induced.  He was given Cipro.  He was placed in the lithotomy position and fitted with PAFOs.  His perineum and genitalia were prepped with Betadine solution.  He was draped in usual sterile fashion.  Cystoscopy was performed using a 22-French scope and 12-degree lens. Examination revealed a  normal urethra, into the bulb where in the proximal bulb/membranous urethra, there was a very tight stricture that would not admit the scope.  A 5-French open-end catheter was placed through the lumen of the stricture, but it would not advanced to the bladder.  Contrast was instilled.  This revealed a possible filling defect in the prostatic urethra.  The contrast did flow into the bladder and there was prompt reflux up the right ureteral orifice even with small volume of contrast.  At this point, a guidewire was passed through the open-end catheter and I was able to negotiate this into the bladder. A 15 cm 24-French balloon was then passed over the wire.  This was inflated to 20 atmospheres and held for 2 minutes and deflated. Fluoroscopic monitoring revealed disappearance of the waist.  After completion of balloon dilation, the balloon was removed.  The wire was left in place.  The cystoscope was reinserted alongside of the wire. The stricture was well disrupted.  Examination of the prostate revealed some radiation changes and blanching, but no obstruction.  He did have a small middle lobe.  During endoscopy some bleeding occurred at the bladder neck over the middle lobe.  The bladder was inspected.  He had mild-to-moderate trabeculation.  No diverticular were noted.  He  did have some erythema consistent with chronic cystitis particularly on the left side of the bladder.  The was urine turbid from the bleeding at the bladder neck, so a Bugbee electrode was used to fulgurate the bladder neck.  Despite fulguration and cessation of bleeding, I was then able to identify the ureteral orifices.  Further inspection of bladder wall revealed nothing to suggest a papillary tumor stone.  The cystoscope was removed and once again inspection revealed the stricture was well disrupted, it was difficult to assess the sphincter under anesthesia.  A 20-French silastic Foley catheter was  inserted.  The balloon was filled with 20 mL of sterile fluid.  A cystogram was then performed with 500 mL of Cystografin.  Once again, he had prompt reflux on the right. He did not reflux on the left.  Once the bladder was drained, the contrast readily effluxed from the right system.  The catheter was then irrigated until the return was very light pink, and then the catheter was placed to leg bag drainage.  He was taken down from lithotomy position.  His anesthetic was reversed.  He was moved to recovery in stable condition.  There were no complications.  He will be scheduled to return to see me in the office on Monday for renal ultrasound, stat BUN and creatinine, and a fill pull and flow.     Excell Seltzer. Annabell Howells, M.D.     JJW/MEDQ  D:  04/06/2012  T:  04/07/2012  Job:  [id number]

## 2012-04-10 ENCOUNTER — Ambulatory Visit (INDEPENDENT_AMBULATORY_CARE_PROVIDER_SITE_OTHER): Payer: Medicare Other | Admitting: Surgery

## 2012-04-24 ENCOUNTER — Ambulatory Visit (INDEPENDENT_AMBULATORY_CARE_PROVIDER_SITE_OTHER): Payer: Medicare Other | Admitting: Surgery

## 2012-04-24 ENCOUNTER — Encounter (INDEPENDENT_AMBULATORY_CARE_PROVIDER_SITE_OTHER): Payer: Self-pay | Admitting: Surgery

## 2012-04-24 VITALS — BP 138/80 | HR 68 | Temp 97.2°F | Resp 20 | Ht 74.0 in | Wt 182.0 lb

## 2012-04-24 DIAGNOSIS — E042 Nontoxic multinodular goiter: Secondary | ICD-10-CM | POA: Insufficient documentation

## 2012-04-24 DIAGNOSIS — E210 Primary hyperparathyroidism: Secondary | ICD-10-CM

## 2012-04-24 NOTE — Patient Instructions (Signed)

## 2012-04-24 NOTE — Progress Notes (Signed)
General Surgery - [facility]  Chief Complaint  Patient presents with  . New Evaluation    primary hyperparathyroidism - referral from Dr. Martin Webb    HISTORY: The patient is a 72-year-old black male who presents today accompanied by his wife for evaluation of newly diagnosed primary hyperparathyroidism. Patient was undergoing evaluation by his nephrologist. He was noted to have an elevated serum calcium level. Recent laboratories in March 2014 show a total calcium level of 11.5 and an elevated intact PTH level of 167.8. Patient underwent a thyroid ultrasound which showed multiple bilateral thyroid nodules, all of which were hypoechoic and less than 5 mm in size. The thyroid was normal in size. However there was an extrinsic mass measuring 9 mm in size located inferior and posterior to the right thyroid lobe consistent with a parathyroid adenoma. 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 Patient is now referred for parathyroidectomy.  Patient has a distant history of nephrolithiasis. He has a variety of bone and joint discomfort complaints. Patient denies any previous surgical procedures of the neck. There is no family history of endocrine disease.  Past Medical History  Diagnosis Date  . Urethral stricture   . History of prostate cancer     S/P RADIACTIVE SEED IMPLANTS  . Parathyroid abnormality     RIGHT SIDE NODULES--  ELEVATED CALCIUM LEVEL-- CURRENT FURTHER TESTING BEING DONE  . GERD (gastroesophageal reflux disease)   . Gouty arthritis     ALL JOINTS  . Hydronephrosis, bilateral   . Chronic renal insufficiency     NEPHROLOGIST-  DR WEBB -- LAST VISIT MARCH 2014  . Pernicious anemia FOLLOWED BY DR WEBB    x3 feraheme injection --- last one 03-22-2012     Current Outpatient Prescriptions  Medication Sig Dispense Refill  . allopurinol (ZYLOPRIM) 300 MG tablet Take 300 mg by mouth daily.      . amLODipine (NORVASC) 10  MG tablet Take 10 mg by mouth daily.      . atenolol (TENORMIN) 100 MG tablet Take 100 mg by mouth daily.      . hydrochlorothiazide (MICROZIDE) 12.5 MG capsule Take 12.5 mg by mouth daily.      . losartan (COZAAR) 100 MG tablet Take 100 mg by mouth daily.      . Multiple Vitamin (MULTIVITAMIN) tablet Take 1 tablet by mouth daily.      . omeprazole (PRILOSEC) 20 MG capsule Take 20 mg by mouth as needed.       . cephALEXin (KEFLEX) 500 MG capsule Take 1 capsule (500 mg total) by mouth 3 (three) times daily.  21 capsule  0  . HYDROcodone-acetaminophen (NORCO) 5-325 MG per tablet Take 1 tablet by mouth every 6 (six) hours as needed for pain.  20 tablet  0  . phenazopyridine (PYRIDIUM) 200 MG tablet Take 1 tablet (200 mg total) by mouth 3 (three) times daily as needed for pain.  15 tablet  1   No current facility-administered medications for this visit.     No Known Allergies   No family history on file.   History   Social History  . Marital Status: Married    Spouse Name: N/A    Number of Children: N/A  . Years of Education: N/A   Social History Main Topics  . Smoking status: Current Every Day Smoker -- 1.50 packs/day for 55 years    Types: Cigarettes  . Smokeless tobacco: Never Used  . Alcohol Use:   7.0 oz/week    14 drink(s) per week     Comment: 2 SHOT DAILY  . Drug Use: No  . Sexually Active: None   Other Topics Concern  . None   Social History Narrative  . None     REVIEW OF SYSTEMS - PERTINENT POSITIVES ONLY: Denies tremor. Denies palpitations. Denies compressive symptoms. Denies fatigue.  EXAM: Filed Vitals:   04/24/12 1336  BP: 138/80  Pulse: 68  Temp: 97.2 F (36.2 C)  Resp: 20    HEENT: normocephalic; pupils equal and reactive; sclerae clear; dentition fair; mucous membranes moist NECK:  No palpable masses or nodularity in the thyroid bed; symmetric on extension; no palpable anterior or posterior cervical lymphadenopathy; no supraclavicular masses; no  tenderness CHEST: clear to auscultation bilaterally without rales, rhonchi, or wheezes CARDIAC: regular rate and rhythm without significant murmur; peripheral pulses are full EXT:  non-tender without edema; no deformity NEURO: no gross focal deficits; no sign of tremor   LABORATORY RESULTS: See [facility] (CHL-Epic) for most recent results   RADIOLOGY RESULTS: See [facility] (CHL-Epic) for most recent results   IMPRESSION: #1 primary hyperparathyroidism, 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 We reviewed the anatomy and I provided them with written literature regarding thyroid and parathyroid disease.  We discussed the procedure of minimally invasive parathyroidectomy at length. We discussed the procedure, the hospital stay, and the postoperative recovery. We discussed potential complications. We discussed the possibility of a second gland adenoma. May understand and wish to proceed with surgery in the near future.  The risks and benefits of the procedure have been discussed at length with the patient.  The patient understands the proposed procedure, potential alternative treatments, and the course of recovery to be expected.  All of the patient's questions have been answered at this time.  The patient wishes to proceed with surgery.  Crescentia Boutwell M. Buford Gayler, MD, FACS General & Endocrine Surgery [facility]   Visit Diagnoses: 1. Hyperparathyroidism, primary   2. Multiple thyroid nodules     Primary Care Physician: GATES,ROBERT NEVILL, MD   

## 2012-04-25 ENCOUNTER — Encounter (HOSPITAL_COMMUNITY): Payer: Self-pay | Admitting: *Deleted

## 2012-04-27 ENCOUNTER — Encounter (HOSPITAL_COMMUNITY): Payer: Self-pay | Admitting: Surgery

## 2012-04-27 NOTE — Interval H&P Note (Signed)
History and Physical Interval Note:  04/27/2012 8:38 PM  Jerry Wise  has presented today for surgery, with the diagnosis of hyperparathyroidism.  The various methods of treatment have been discussed with the patient and family. After consideration of risks, benefits and other options for treatment, the patient has consented to    Procedure(s):  RIGHT INFERIOR PARATHYROIDECTOMY (Right) as a surgical intervention .    The patient's history has been reviewed, patient examined, no change in status, stable for surgery.  I have reviewed the patient's chart and labs.  Questions were answered to the patient's satisfaction.    Velora Heckler, MD, [facility] Office: [phone]    Eshaal Duby Judie Petit

## 2012-04-27 NOTE — H&P (View-Only) (Signed)
General Surgery [facility]  Chief Complaint  Patient presents with  . New Evaluation    primary hyperparathyroidism - referral from Dr. Elvis Coil    HISTORY: The patient is a 72 year old black male who presents today accompanied by his wife for evaluation of newly diagnosed primary hyperparathyroidism. Patient was undergoing evaluation by his nephrologist. He was noted to have an elevated serum calcium level. Recent laboratories in March 2014 show a total calcium level of 11.5 and an elevated intact PTH level of 167.8. Patient underwent a thyroid ultrasound which showed multiple bilateral thyroid nodules, all of which were hypoechoic and less than 5 mm in size. The thyroid was normal in size. However there was an extrinsic mass measuring 9 mm in size located inferior and posterior to the right thyroid lobe consistent with a parathyroid adenoma. 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 Patient is now referred for parathyroidectomy.  Patient has a distant history of nephrolithiasis. He has a variety of bone and joint discomfort complaints. Patient denies any previous surgical procedures of the neck. There is no family history of endocrine disease.  Past Medical History  Diagnosis Date  . Urethral stricture   . History of prostate cancer     S/P RADIACTIVE SEED IMPLANTS  . Parathyroid abnormality     RIGHT SIDE NODULES--  ELEVATED CALCIUM LEVEL-- CURRENT FURTHER TESTING BEING DONE  . GERD (gastroesophageal reflux disease)   . Gouty arthritis     ALL JOINTS  . Hydronephrosis, bilateral   . Chronic renal insufficiency     NEPHROLOGIST-  DR Hyman Hopes -- LAST VISIT MARCH 2014  . Pernicious anemia FOLLOWED BY DR Hyman Hopes    x3 feraheme injection --- last one 03-22-2012     Current Outpatient Prescriptions  Medication Sig Dispense Refill  . allopurinol (ZYLOPRIM) 300 MG tablet Take 300 mg by mouth daily.      Marland Kitchen amLODipine (NORVASC) 10  MG tablet Take 10 mg by mouth daily.      Marland Kitchen atenolol (TENORMIN) 100 MG tablet Take 100 mg by mouth daily.      . hydrochlorothiazide (MICROZIDE) 12.5 MG capsule Take 12.5 mg by mouth daily.      Marland Kitchen losartan (COZAAR) 100 MG tablet Take 100 mg by mouth daily.      . Multiple Vitamin (MULTIVITAMIN) tablet Take 1 tablet by mouth daily.      Marland Kitchen omeprazole (PRILOSEC) 20 MG capsule Take 20 mg by mouth as needed.       . cephALEXin (KEFLEX) 500 MG capsule Take 1 capsule (500 mg total) by mouth 3 (three) times daily.  21 capsule  0  . HYDROcodone-acetaminophen (NORCO) 5-325 MG per tablet Take 1 tablet by mouth every 6 (six) hours as needed for pain.  20 tablet  0  . phenazopyridine (PYRIDIUM) 200 MG tablet Take 1 tablet (200 mg total) by mouth 3 (three) times daily as needed for pain.  15 tablet  1   No current facility-administered medications for this visit.     No Known Allergies   No family history on file.   History   Social History  . Marital Status: Married    Spouse Name: N/A    Number of Children: N/A  . Years of Education: N/A   Social History Main Topics  . Smoking status: Current Every Day Smoker -- 1.50 packs/day for 55 years    Types: Cigarettes  . Smokeless tobacco: Never Used  . Alcohol Use:  7.0 oz/week    14 drink(s) per week     Comment: 2 SHOT DAILY  . Drug Use: No  . Sexually Active: None   Other Topics Concern  . None   Social History Narrative  . None     REVIEW OF SYSTEMS - PERTINENT POSITIVES ONLY: Denies tremor. Denies palpitations. Denies compressive symptoms. Denies fatigue.  EXAM: Filed Vitals:   04/24/12 1336  BP: 138/80  Pulse: 68  Temp: 97.2 F (36.2 C)  Resp: 20    HEENT: normocephalic; pupils equal and reactive; sclerae clear; dentition fair; mucous membranes moist NECK:  No palpable masses or nodularity in the thyroid bed; symmetric on extension; no palpable anterior or posterior cervical lymphadenopathy; no supraclavicular masses; no  tenderness CHEST: clear to auscultation bilaterally without rales, rhonchi, or wheezes CARDIAC: regular rate and rhythm without significant murmur; peripheral pulses are full EXT:  non-tender without edema; no deformity NEURO: no gross focal deficits; no sign of tremor   LABORATORY RESULTS: See [facility] (CHL-Epic) for most recent results   RADIOLOGY RESULTS: See [facility] (CHL-Epic) for most recent results   IMPRESSION: #1 primary hyperparathyroidism, 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 We reviewed the anatomy and I provided them with written literature regarding thyroid and parathyroid disease.  We discussed the procedure of minimally invasive parathyroidectomy at length. We discussed the procedure, the hospital stay, and the postoperative recovery. We discussed potential complications. We discussed the possibility of a second gland adenoma. May understand and wish to proceed with surgery in the near future.  The risks and benefits of the procedure have been discussed at length with the patient.  The patient understands the proposed procedure, potential alternative treatments, and the course of recovery to be expected.  All of the patient's questions have been answered at this time.  The patient wishes to proceed with surgery.  Velora Heckler, MD, FACS General & Endocrine Surgery [facility]   Visit Diagnoses: 1. Hyperparathyroidism, primary   2. Multiple thyroid nodules     Primary Care Physician: Pearla Dubonnet, MD

## 2012-04-28 ENCOUNTER — Ambulatory Visit (HOSPITAL_COMMUNITY): Payer: Medicare Other

## 2012-04-28 ENCOUNTER — Ambulatory Visit (HOSPITAL_COMMUNITY): Payer: Medicare Other | Admitting: Anesthesiology

## 2012-04-28 ENCOUNTER — Encounter (HOSPITAL_COMMUNITY): Payer: Self-pay | Admitting: Anesthesiology

## 2012-04-28 ENCOUNTER — Encounter (HOSPITAL_COMMUNITY): Payer: Self-pay | Admitting: *Deleted

## 2012-04-28 ENCOUNTER — Ambulatory Visit (HOSPITAL_COMMUNITY)
Admission: RE | Admit: 2012-04-28 | Discharge: 2012-04-28 | Disposition: A | Discharge status: Home / Self Care | Payer: Medicare Other | Source: Ambulatory Visit | Attending: Surgery | Admitting: Surgery

## 2012-04-28 ENCOUNTER — Encounter (HOSPITAL_COMMUNITY)
Admission: RE | Disposition: A | Discharge status: Home / Self Care | Payer: Self-pay | Source: Ambulatory Visit | Attending: Surgery

## 2012-04-28 DIAGNOSIS — E210 Primary hyperparathyroidism: Secondary | ICD-10-CM

## 2012-04-28 DIAGNOSIS — Z79899 Other long term (current) drug therapy: Secondary | ICD-10-CM | POA: Insufficient documentation

## 2012-04-28 DIAGNOSIS — D510 Vitamin B12 deficiency anemia due to intrinsic factor deficiency: Secondary | ICD-10-CM | POA: Insufficient documentation

## 2012-04-28 DIAGNOSIS — M109 Gout, unspecified: Secondary | ICD-10-CM | POA: Insufficient documentation

## 2012-04-28 DIAGNOSIS — K219 Gastro-esophageal reflux disease without esophagitis: Secondary | ICD-10-CM | POA: Insufficient documentation

## 2012-04-28 DIAGNOSIS — F17200 Nicotine dependence, unspecified, uncomplicated: Secondary | ICD-10-CM | POA: Insufficient documentation

## 2012-04-28 DIAGNOSIS — N189 Chronic kidney disease, unspecified: Secondary | ICD-10-CM | POA: Insufficient documentation

## 2012-04-28 DIAGNOSIS — E042 Nontoxic multinodular goiter: Secondary | ICD-10-CM | POA: Insufficient documentation

## 2012-04-28 DIAGNOSIS — N1330 Unspecified hydronephrosis: Secondary | ICD-10-CM | POA: Insufficient documentation

## 2012-04-28 DIAGNOSIS — I129 Hypertensive chronic kidney disease with stage 1 through stage 4 chronic kidney disease, or unspecified chronic kidney disease: Secondary | ICD-10-CM | POA: Insufficient documentation

## 2012-04-28 HISTORY — DX: Essential (primary) hypertension: I10

## 2012-04-28 HISTORY — PX: PARATHYROIDECTOMY: SHX19

## 2012-04-28 LAB — BASIC METABOLIC PANEL
CO2: 24 mEq/L (ref 19–32)
Chloride: 101 mEq/L (ref 96–112)
GFR calc Af Amer: 37 mL/min — ABNORMAL LOW (ref >90)
Potassium: 3.8 mEq/L (ref 3.5–5.1)

## 2012-04-28 LAB — CBC
Platelets: 323 10*3/uL (ref 150–400)
RBC: 4.65 MIL/uL (ref 4.22–5.81)
RDW: 21.9 % — ABNORMAL HIGH (ref 11.5–15.5)
WBC: 5.4 10*3/uL (ref 4.0–10.5)

## 2012-04-28 LAB — SURGICAL PCR SCREEN: Staphylococcus aureus: NEGATIVE

## 2012-04-28 SURGERY — PARATHYROIDECTOMY
Anesthesia: General | Site: Neck | Laterality: Right | Wound class: Clean

## 2012-04-28 MED ORDER — HYDROMORPHONE HCL PF 1 MG/ML IJ SOLN
INTRAMUSCULAR | Status: AC
  Filled 2012-04-28: qty 1

## 2012-04-28 MED ORDER — PROMETHAZINE HCL 25 MG/ML IJ SOLN: 6.2500 mg | INTRAMUSCULAR | Status: DC | PRN

## 2012-04-28 MED ORDER — HYDROCODONE-ACETAMINOPHEN 5-325 MG PO TABS: 1.0000 | ORAL_TABLET | ORAL | Status: DC | PRN

## 2012-04-28 MED ORDER — 0.9 % SODIUM CHLORIDE (POUR BTL) OPTIME
TOPICAL | Status: DC | PRN
  Administered 2012-04-28: 1000 mL

## 2012-04-28 MED ORDER — BUPIVACAINE HCL 0.25 % IJ SOLN
INTRAMUSCULAR | Status: DC | PRN
  Administered 2012-04-28: 10 mL

## 2012-04-28 MED ORDER — CEFAZOLIN SODIUM-DEXTROSE 2-3 GM-% IV SOLR
INTRAVENOUS | Status: AC
  Filled 2012-04-28: qty 50

## 2012-04-28 MED ORDER — ROCURONIUM BROMIDE 100 MG/10ML IV SOLN
INTRAVENOUS | Status: DC | PRN
  Administered 2012-04-28: 50 mg via INTRAVENOUS

## 2012-04-28 MED ORDER — ACETAMINOPHEN 10 MG/ML IV SOLN
INTRAVENOUS | Status: AC
  Filled 2012-04-28: qty 100

## 2012-04-28 MED ORDER — ACETAMINOPHEN 10 MG/ML IV SOLN
INTRAVENOUS | Status: DC | PRN
  Administered 2012-04-28: 1000 mg via INTRAVENOUS

## 2012-04-28 MED ORDER — HYDROMORPHONE HCL PF 1 MG/ML IJ SOLN
0.2500 mg | INTRAMUSCULAR | Status: DC | PRN
  Administered 2012-04-28 (×2): 0.5 mg via INTRAVENOUS

## 2012-04-28 MED ORDER — MUPIROCIN 2 % EX OINT
TOPICAL_OINTMENT | CUTANEOUS | Status: AC
  Administered 2012-04-28: 1
  Filled 2012-04-28: qty 22

## 2012-04-28 MED ORDER — CEFAZOLIN SODIUM-DEXTROSE 2-3 GM-% IV SOLR
2.0000 g | INTRAVENOUS | Status: AC
  Administered 2012-04-28: 2 g via INTRAVENOUS

## 2012-04-28 MED ORDER — FENTANYL CITRATE 0.05 MG/ML IJ SOLN
INTRAMUSCULAR | Status: DC | PRN
  Administered 2012-04-28: 100 ug via INTRAVENOUS

## 2012-04-28 MED ORDER — MUPIROCIN 2 % EX OINT: TOPICAL_OINTMENT | Freq: Two times a day (BID) | CUTANEOUS | Status: DC

## 2012-04-28 MED ORDER — PROPOFOL 10 MG/ML IV BOLUS
INTRAVENOUS | Status: DC | PRN
  Administered 2012-04-28: 160 mg via INTRAVENOUS

## 2012-04-28 MED ORDER — GLYCOPYRROLATE 0.2 MG/ML IJ SOLN
INTRAMUSCULAR | Status: DC | PRN
  Administered 2012-04-28: .6 mg via INTRAVENOUS

## 2012-04-28 MED ORDER — NEOSTIGMINE METHYLSULFATE 1 MG/ML IJ SOLN
INTRAMUSCULAR | Status: DC | PRN
  Administered 2012-04-28: 4 mg via INTRAVENOUS

## 2012-04-28 MED ORDER — ONDANSETRON HCL 4 MG/2ML IJ SOLN
INTRAMUSCULAR | Status: DC | PRN
  Administered 2012-04-28: 4 mg via INTRAVENOUS

## 2012-04-28 MED ORDER — MIDAZOLAM HCL 5 MG/5ML IJ SOLN
INTRAMUSCULAR | Status: DC | PRN
  Administered 2012-04-28: 1 mg via INTRAVENOUS

## 2012-04-28 MED ORDER — BUPIVACAINE HCL (PF) 0.25 % IJ SOLN
INTRAMUSCULAR | Status: AC
  Filled 2012-04-28: qty 30

## 2012-04-28 MED ORDER — LIDOCAINE HCL (CARDIAC) 20 MG/ML IV SOLN
INTRAVENOUS | Status: DC | PRN
  Administered 2012-04-28: 50 mg via INTRAVENOUS

## 2012-04-28 MED ORDER — LACTATED RINGERS IV SOLN
INTRAVENOUS | Status: DC | PRN
  Administered 2012-04-28 (×2): via INTRAVENOUS

## 2012-04-28 MED ORDER — HYDROCODONE-ACETAMINOPHEN 5-325 MG PO TABS
1.0000 | ORAL_TABLET | ORAL | Status: DC | PRN
  Administered 2012-04-28: 1 via ORAL
  Filled 2012-04-28: qty 1

## 2012-04-28 SURGICAL SUPPLY — 40 items
ATTRACTOMAT 16X20 MAGNETIC DRP (DRAPES) ×2 IMPLANT
BENZOIN TINCTURE PRP APPL 2/3 (GAUZE/BANDAGES/DRESSINGS) IMPLANT
BLADE HEX COATED 2.75 (ELECTRODE) ×2 IMPLANT
BLADE SURG 15 STRL LF DISP TIS (BLADE) ×1 IMPLANT
BLADE SURG 15 STRL SS (BLADE) ×1
CANISTER SUCTION 2500CC (MISCELLANEOUS) IMPLANT
CHLORAPREP W/TINT 10.5 ML (MISCELLANEOUS) ×2 IMPLANT
CLIP TI MEDIUM 6 (CLIP) ×4 IMPLANT
CLIP TI WIDE RED SMALL 6 (CLIP) ×4 IMPLANT
CLOTH BEACON ORANGE TIMEOUT ST (SAFETY) ×2 IMPLANT
DISSECTOR ROUND CHERRY 3/8 STR (MISCELLANEOUS) IMPLANT
DRAPE PED LAPAROTOMY (DRAPES) ×2 IMPLANT
DRESSING SURGICEL FIBRLLR 1X2 (HEMOSTASIS) ×1 IMPLANT
DRSG SURGICEL FIBRILLAR 1X2 (HEMOSTASIS) ×2
ELECT REM PT RETURN 9FT ADLT (ELECTROSURGICAL) ×2
ELECTRODE REM PT RTRN 9FT ADLT (ELECTROSURGICAL) ×1 IMPLANT
GAUZE SPONGE 4X4 16PLY XRAY LF (GAUZE/BANDAGES/DRESSINGS) ×2 IMPLANT
GLOVE SURG ORTHO 8.0 STRL STRW (GLOVE) ×2 IMPLANT
GOWN STRL NON-REIN LRG LVL3 (GOWN DISPOSABLE) IMPLANT
GOWN STRL REIN XL XLG (GOWN DISPOSABLE) ×8 IMPLANT
KIT BASIN OR (CUSTOM PROCEDURE TRAY) ×2 IMPLANT
MANIFOLD NEPTUNE II (INSTRUMENTS) ×2 IMPLANT
NEEDLE HYPO 25X1 1.5 SAFETY (NEEDLE) ×2 IMPLANT
NS IRRIG 1000ML POUR BTL (IV SOLUTION) ×2 IMPLANT
PACK BASIC VI WITH GOWN DISP (CUSTOM PROCEDURE TRAY) ×2 IMPLANT
PENCIL BUTTON HOLSTER BLD 10FT (ELECTRODE) ×2 IMPLANT
SPONGE GAUZE 4X4 12PLY (GAUZE/BANDAGES/DRESSINGS) ×2 IMPLANT
STAPLER VISISTAT 35W (STAPLE) ×2 IMPLANT
STRIP CLOSURE SKIN 1/2X4 (GAUZE/BANDAGES/DRESSINGS) ×2 IMPLANT
SUT MNCRL AB 4-0 PS2 18 (SUTURE) ×2 IMPLANT
SUT SILK 2 0 (SUTURE) ×1
SUT SILK 2-0 18XBRD TIE 12 (SUTURE) ×1 IMPLANT
SUT SILK 3 0 (SUTURE)
SUT SILK 3-0 18XBRD TIE 12 (SUTURE) IMPLANT
SUT VIC AB 3-0 SH 18 (SUTURE) ×2 IMPLANT
SYR BULB IRRIGATION 50ML (SYRINGE) ×2 IMPLANT
SYR CONTROL 10ML LL (SYRINGE) ×2 IMPLANT
TAPE CLOTH SURG 4X10 WHT LF (GAUZE/BANDAGES/DRESSINGS) ×2 IMPLANT
TOWEL OR 17X26 10 PK STRL BLUE (TOWEL DISPOSABLE) ×2 IMPLANT
YANKAUER SUCT BULB TIP 10FT TU (MISCELLANEOUS) ×2 IMPLANT

## 2012-04-28 NOTE — Op Note (Signed)
OPERATIVE REPORT - PARATHYROIDECTOMY  Preoperative diagnosis: Primary hyperparathyroidism  Postop diagnosis: Same  Procedure: Right inferior minimally invasive parathyroidectomy  Surgeon:  Velora Heckler, MD, FACS  Assistant:  Avel Peace, MD, FACS  Anesthesia: Gen. endotracheal  Estimated blood loss: Minimal  Preparation: ChloraPrep  Indications: Patient referred with elevated intact PTH and serum calcium levels.  USN shows nodule behind inferior pole of thyroid on right.  Sestamibi scan confirms right inferior position.  Now for minimally invasive parathyroidectomy.  Procedure: Patient was prepared in the holding area. He was brought to operating room and placed in a supine position on the operating room table. Following administration of general anesthesia, the patient was positioned and then prepped and draped in the usual strict aseptic fashion. After ascertaining that an adequate level of anesthesia been achieved, a neck incision is made with a #15 blade. Dissection was carried through subcutaneous tissues and platysma. Hemostasis was obtained with the electrocautery. Skin flaps were developed circumferentially and a Weitlander retractor was placed for exposure.  Strap muscles were incised in the midline. Strap muscles were reflected exposing the thyroid lobe. With gentle blunt dissection the thyroid lobe was mobilized.  Dissection was carried through adipose tissue and an enlarged parathyroid gland is identified. It is gently mobilized. Vascular structures are divided between small and medium ligaclips. Care is taken to avoid the recurrent laryngeal nerve and the esophagus. Gland is completely excised. It is submitted to pathology. Frozen section confirms parathyroid tissue consistent with adenoma.  Neck is irrigated with warm saline. Good hemostasis is noted. Surgicel is placed in the operative field. Strap muscles are reapproximated in the midline with interrupted 3-0 Vicryl  sutures. Platysma was closed with interrupted 3-0 Vicryl sutures. Skin is closed with a running 4-0 Monocryl subcuticular suture. Local anesthetic is infiltrated circumferentially. Wound was washed and dried and benzoin and Steri-Strips are applied. Sterile gauze dressing is applied. Patient is awakened from anesthesia and brought to the recovery room. The patient tolerated the procedure well.   Velora Heckler, MD, FACS General & Endocrine Surgery [facility]

## 2012-04-28 NOTE — Anesthesia Preprocedure Evaluation (Addendum)
Anesthesia Evaluation  Patient identified by MRN, date of birth, ID band Patient awake    Reviewed: Allergy & Precautions, H&P , NPO status , Patient's Chart, lab work & pertinent test results  History of Anesthesia Complications (+) MALIGNANT HYPERTHERMIA  Airway Mallampati: II TM Distance: >3 FB Neck ROM: Full    Dental  (+) Edentulous Upper and Edentulous Lower   Pulmonary neg pulmonary ROS,  breath sounds clear to auscultation  Pulmonary exam normal       Cardiovascular hypertension, Pt. on medications Rhythm:Regular Rate:Normal     Neuro/Psych negative neurological ROS  negative psych ROS   GI/Hepatic negative GI ROS, Neg liver ROS,   Endo/Other  negative endocrine ROS  Renal/GU negative Renal ROS  negative genitourinary   Musculoskeletal negative musculoskeletal ROS (+)   Abdominal   Peds negative pediatric ROS (+)  Hematology negative hematology ROS (+)   Anesthesia Other Findings   Reproductive/Obstetrics negative OB ROS                          Anesthesia Physical Anesthesia Plan  ASA: II  Anesthesia Plan: General   Post-op Pain Management:    Induction: Intravenous  Airway Management Planned: Oral ETT  Additional Equipment:   Intra-op Plan:   Post-operative Plan: Extubation in OR  Informed Consent: I have reviewed the patients History and Physical, chart, labs and discussed the procedure including the risks, benefits and alternatives for the proposed anesthesia with the patient or authorized representative who has indicated his/her understanding and acceptance.   Dental advisory given  Plan Discussed with: CRNA and Surgeon  Anesthesia Plan Comments:         Anesthesia Quick Evaluation

## 2012-04-28 NOTE — Transfer of Care (Signed)
Immediate Anesthesia Transfer of Care Note  Patient: Jerry Wise  Procedure(s) Performed: Procedure(s):  RIGHT INFERIOR PARATHYROIDECTOMY (Right)  Patient Location: PACU  Anesthesia Type:General  Level of Consciousness: awake, alert , oriented and patient cooperative  Airway & Oxygen Therapy: Patient Spontanous Breathing and Patient connected to face mask oxygen  Post-op Assessment: Report given to PACU RN and Post -op Vital signs reviewed and stable  Post vital signs: Reviewed and stable  Complications: No apparent anesthesia complications

## 2012-04-28 NOTE — Anesthesia Postprocedure Evaluation (Signed)
  Anesthesia Post-op Note  Patient: Jerry Wise  Procedure(s) Performed: Procedure(s) (LRB):  RIGHT INFERIOR PARATHYROIDECTOMY (Right)  Patient Location: PACU  Anesthesia Type: General  Level of Consciousness: awake and alert   Airway and Oxygen Therapy: Patient Spontanous Breathing  Post-op Pain: mild  Post-op Assessment: Post-op Vital signs reviewed, Patient's Cardiovascular Status Stable, Respiratory Function Stable, Patent Airway and No signs of Nausea or vomiting  Last Vitals:  Filed Vitals:   04/28/12 1100  BP:   Pulse:   Temp: 35.9 C  Resp:     Post-op Vital Signs: stable   Complications: No apparent anesthesia complications

## 2012-05-01 ENCOUNTER — Other Ambulatory Visit (INDEPENDENT_AMBULATORY_CARE_PROVIDER_SITE_OTHER): Payer: Self-pay

## 2012-05-01 ENCOUNTER — Telehealth (INDEPENDENT_AMBULATORY_CARE_PROVIDER_SITE_OTHER): Payer: Self-pay | Admitting: General Surgery

## 2012-05-01 ENCOUNTER — Encounter (HOSPITAL_COMMUNITY): Payer: Self-pay | Admitting: Surgery

## 2012-05-01 NOTE — Telephone Encounter (Signed)
Spoke with Jerry Wise she is aware of appt  05/15/12  And was advised of labs 2 days before

## 2012-05-12 ENCOUNTER — Other Ambulatory Visit (INDEPENDENT_AMBULATORY_CARE_PROVIDER_SITE_OTHER): Payer: Self-pay | Admitting: Surgery

## 2012-05-15 ENCOUNTER — Encounter (INDEPENDENT_AMBULATORY_CARE_PROVIDER_SITE_OTHER): Payer: Self-pay | Admitting: Surgery

## 2012-05-15 ENCOUNTER — Ambulatory Visit (INDEPENDENT_AMBULATORY_CARE_PROVIDER_SITE_OTHER): Payer: Medicare Other | Admitting: Surgery

## 2012-05-15 VITALS — BP 138/66 | HR 65 | Temp 96.8°F | Ht 74.0 in | Wt 175.8 lb

## 2012-05-15 DIAGNOSIS — E213 Hyperparathyroidism, unspecified: Secondary | ICD-10-CM

## 2012-05-15 NOTE — Patient Instructions (Signed)
  COCOA BUTTER & VITAMIN E CREAM  ([employer] or other brand)  Apply cocoa butter/vitamin E cream to your incision 2 - 3 times daily.  Massage cream into incision for one minute with each application.  Use sunscreen (50 SPF or higher) for first 6 months after surgery if area is exposed to sun.  You may substitute Mederma or other scar reducing creams as desired.   

## 2012-05-15 NOTE — Progress Notes (Signed)
General Surgery [facility]  Visit Diagnoses: 1. Hyperparathyroidism     HISTORY: The patient is a 72 year old male who underwent minimally invasive parathyroidectomy on 04/28/2012. Serum calcium level on follow-up is normal at 9.2.  EXAM: Surgical wound is well healed. No sign of seroma. No sign of infection.  IMPRESSION: Status post minimally invasive parathyroidectomy  PLAN: Patient will begin applying topical creams to his incisions. He will return in 6 weeks for final wound check. We will check an intact PTH level and serum calcium level prior to that office visit.  Velora Heckler, MD, FACS General & Endocrine Surgery [facility]

## 2012-05-18 LAB — CALCIUM: Calcium: 9.2 mg/dL (ref 8.6–10.2)

## 2012-05-19 ENCOUNTER — Encounter (INDEPENDENT_AMBULATORY_CARE_PROVIDER_SITE_OTHER): Payer: Self-pay

## 2012-06-27 ENCOUNTER — Telehealth (INDEPENDENT_AMBULATORY_CARE_PROVIDER_SITE_OTHER): Payer: Self-pay

## 2012-06-27 NOTE — Telephone Encounter (Signed)
I called to remind pt of appt and labs needed prior to appt. Pt states he does not have transportation to keep appt tomorrow and has not had labs done. I advised pt to go to lab asap once he can arrange transportation. Pt states he will get labs when he can but declined to make another ov.

## 2012-06-28 ENCOUNTER — Encounter (INDEPENDENT_AMBULATORY_CARE_PROVIDER_SITE_OTHER): Payer: Medicare Other | Admitting: Surgery

## 2013-03-28 ENCOUNTER — Ambulatory Visit
Admission: RE | Admit: 2013-03-28 | Discharge: 2013-03-28 | Disposition: A | Discharge status: Home / Self Care | Payer: Medicare Other | Source: Ambulatory Visit | Attending: Internal Medicine | Admitting: Internal Medicine

## 2013-03-28 ENCOUNTER — Other Ambulatory Visit: Payer: Self-pay | Admitting: Internal Medicine

## 2013-03-28 DIAGNOSIS — F17200 Nicotine dependence, unspecified, uncomplicated: Secondary | ICD-10-CM

## 2014-08-22 ENCOUNTER — Other Ambulatory Visit: Payer: Self-pay | Admitting: Acute Care

## 2014-08-22 DIAGNOSIS — F17210 Nicotine dependence, cigarettes, uncomplicated: Secondary | ICD-10-CM

## 2014-09-05 ENCOUNTER — Ambulatory Visit (INDEPENDENT_AMBULATORY_CARE_PROVIDER_SITE_OTHER)
Admission: RE | Admit: 2014-09-05 | Discharge: 2014-09-05 | Disposition: A | Discharge status: Home / Self Care | Payer: Medicare Other | Source: Ambulatory Visit | Attending: Acute Care | Admitting: Acute Care

## 2014-09-05 ENCOUNTER — Encounter: Payer: Self-pay | Admitting: Acute Care

## 2014-09-05 ENCOUNTER — Ambulatory Visit (INDEPENDENT_AMBULATORY_CARE_PROVIDER_SITE_OTHER): Payer: Medicare Other | Admitting: Acute Care

## 2014-09-05 DIAGNOSIS — F17210 Nicotine dependence, cigarettes, uncomplicated: Secondary | ICD-10-CM

## 2014-09-05 NOTE — Progress Notes (Signed)
Shared Decision Making Visit Lung Cancer Screening Program [id number])   Eligibility:  Age 74 y.o.  Pack Years Smoking History Calculation : 41 pack year smoker (# packs/per year x # years smoked)  Recent History of coughing up blood  no  Unexplained weight loss? no ( >Than 15 pounds within the last 6 months )  Prior History Lung / other cancer no (Diagnosis within the last 5 years already requiring surveillance chest CT Scans).  Smoking Status Current Smoker  Former Smokers: Years since quit: NA  Quit Date: NA  Visit Components:  Discussion included one or more decision making aids. yes  Discussion included risk/benefits of screening. yes  Discussion included potential follow up diagnostic testing for abnormal scans. yes  Discussion included meaning and risk of over diagnosis. yes  Discussion included meaning and risk of False Positives. yes  Discussion included meaning of total radiation exposure. yes  Counseling Included:  Importance of adherence to annual lung cancer LDCT screening. yes  Impact of comorbidities on ability to participate in the program. yes  Ability and willingness to under diagnostic treatment. yes  Smoking Cessation Counseling:  Current Smokers:   Discussed importance of smoking cessation. yes  Information about tobacco cessation classes and interventions provided to patient. yes  Patient provided with "ticket" for LDCT Scan. yes  Symptomatic Patient. no  Counseling: NA  Diagnosis Code: Tobacco Use Z72.0  Asymptomatic Patient yes  Counseling (Intermediate counseling: > three minutes counseling) A6301  Former Smokers:   Discussed the importance of maintaining cigarette abstinence.NA; current smoker  Diagnosis Code: Personal History of Nicotine Dependence. S01.093  Information about tobacco cessation classes and interventions provided to patient.NA  Patient provided with "ticket" for LDCT Scan. yes  Written Order for Lung  Cancer Screening with LDCT placed in Epic. Yes (CT Chest Lung Cancer Screening Low Dose W/O CM) [id number] Z12.2-Screening of respiratory organs Z87.891-Personal history of nicotine dependence  I spent 15 minutes of face to face time with Mr. And Mrs Godek explaining the risks and benefits of the lung cancer screening program. We viewed a power point together , pausing at intervals and stopping to allow for questions to be asked and answered to ensure comprehension of all bullets noted above. We discussed that the single most powerful thing that Jerry Wise can do to decrease his risk of lung cancer is to quit smoking. He states that he has cut down to 3/4 pack per day, but is not ready to quit. I gave him the " Be stronger than your excuses card, with the resources for free nicotine replacement and contact numbers to sign up for smoking cessation classes through [facility] , the [employer], and [employer]. He also has my card and contact information . I have told him to call me when he is ready to quit, and I will help him with prescriptions and support in whatever way I can. He verbalized understanding of all of the above. I gave him a copy of the power point we viewed together to refer to in the future in the event he has any questions. He and his wife know the time and location of the scan, and had no further questions upon leaving the office.   Magdalen Spatz, NP

## 2015-03-26 ENCOUNTER — Other Ambulatory Visit: Payer: Self-pay | Admitting: Acute Care

## 2015-03-26 DIAGNOSIS — F17210 Nicotine dependence, cigarettes, uncomplicated: Principal | ICD-10-CM

## 2015-05-29 ENCOUNTER — Other Ambulatory Visit: Payer: Self-pay | Admitting: Urology

## 2015-05-29 DIAGNOSIS — N1330 Unspecified hydronephrosis: Secondary | ICD-10-CM

## 2015-06-10 ENCOUNTER — Encounter (HOSPITAL_COMMUNITY)
Admission: RE | Admit: 2015-06-10 | Discharge: 2015-06-10 | Disposition: A | Discharge status: Home / Self Care | Payer: Medicare Other | Source: Ambulatory Visit | Attending: Urology | Admitting: Urology

## 2015-06-10 DIAGNOSIS — N1330 Unspecified hydronephrosis: Secondary | ICD-10-CM | POA: Insufficient documentation

## 2015-06-10 MED ORDER — FUROSEMIDE 10 MG/ML IJ SOLN
INTRAMUSCULAR | Status: AC
  Filled 2015-06-10: qty 4

## 2015-06-10 MED ORDER — FUROSEMIDE 10 MG/ML IJ SOLN
40.0000 mg | Freq: Once | INTRAMUSCULAR | Status: AC
  Administered 2015-06-10: 40 mg via INTRAVENOUS

## 2015-06-10 MED ORDER — TECHNETIUM TC 99M MERTIATIDE
15.0000 | Freq: Once | INTRAVENOUS | Status: AC | PRN
  Administered 2015-06-10: 15.08 via INTRAVENOUS

## 2015-09-11 ENCOUNTER — Ambulatory Visit (INDEPENDENT_AMBULATORY_CARE_PROVIDER_SITE_OTHER)
Admission: RE | Admit: 2015-09-11 | Discharge: 2015-09-11 | Disposition: A | Discharge status: Home / Self Care | Payer: Medicare Other | Source: Ambulatory Visit | Attending: Acute Care | Admitting: Acute Care

## 2015-09-11 DIAGNOSIS — Z87891 Personal history of nicotine dependence: Secondary | ICD-10-CM

## 2015-09-11 DIAGNOSIS — F17210 Nicotine dependence, cigarettes, uncomplicated: Principal | ICD-10-CM

## 2015-09-22 ENCOUNTER — Telehealth: Payer: Self-pay | Admitting: Acute Care

## 2015-09-22 DIAGNOSIS — F17210 Nicotine dependence, cigarettes, uncomplicated: Principal | ICD-10-CM

## 2015-09-22 NOTE — Telephone Encounter (Signed)
I have called the results of Jerry Wise' low-dose CT screening to him. I explained that his scan was read as a Lung RADS 2: nodules that are benign in appearance and behavior with a very low likelihood of becoming a clinically active cancer due to size or lack of growth. Recommendation per radiology is for a repeat LDCT in 12 months. I told him that we will order and schedule his follow-up scan for September 2018. I told him we will call him about a month prior to that to get it scheduled. I also let him know that I would send a copy of these results to Jerry Wise primary care physician.Jerry Wise verbalized understanding and had no further questions at completion of the call. He does have my contact information in the event he has questions in the future.

## 2016-09-13 ENCOUNTER — Telehealth: Payer: Self-pay | Admitting: Acute Care

## 2016-09-13 ENCOUNTER — Ambulatory Visit (INDEPENDENT_AMBULATORY_CARE_PROVIDER_SITE_OTHER)
Admission: RE | Admit: 2016-09-13 | Discharge: 2016-09-13 | Disposition: A | Discharge status: Home / Self Care | Payer: Medicare Other | Source: Ambulatory Visit | Attending: Acute Care | Admitting: Acute Care

## 2016-09-13 DIAGNOSIS — Z87891 Personal history of nicotine dependence: Secondary | ICD-10-CM

## 2016-09-13 DIAGNOSIS — F17210 Nicotine dependence, cigarettes, uncomplicated: Principal | ICD-10-CM

## 2016-09-13 NOTE — Telephone Encounter (Signed)
Took the call report from [place] from [facility]---she stated that this was the report from the Lung cancer CT---  These results are in the chart for SG to review.  Will forward this to SG to advise.  Thanks

## 2016-09-14 ENCOUNTER — Other Ambulatory Visit: Payer: Self-pay | Admitting: Acute Care

## 2016-09-14 DIAGNOSIS — R918 Other nonspecific abnormal finding of lung field: Secondary | ICD-10-CM

## 2016-09-14 NOTE — Telephone Encounter (Signed)
Dr. Inda Merlin, [name] had low dose screening CT done 09/13/2016. The scan was read as a lung RADS 4 A. Lung RADS 4 A : suspicious findings, either short term follow up in 3 months or alternatively  PET Scan evaluation may be considered when there is a solid component of  8 mm or larger. We will repeat his scan in December 2018. These results have been called to the patient. I wanted you to be aware of an additional finding of bronchiolitis, and visual nodularity which raises suspicion for sarcoid. Recommendation was to correlate for any clinical signs and symptoms of sarcoid. My plan was to evaluate this site again at the 3 month follow-up scan, and then consult with you about whether or not you would like pulmonary follow-up. Please don't hesitate to contact me with any questions or concerns regarding the results of the scan. We will let you know the results of the follow-up three-month scan. Thank you

## 2016-09-16 ENCOUNTER — Other Ambulatory Visit: Payer: Self-pay | Admitting: Acute Care

## 2016-09-20 ENCOUNTER — Telehealth: Payer: Self-pay | Admitting: Acute Care

## 2016-09-20 NOTE — Telephone Encounter (Signed)
PT placed on reminder list for 12/14/16.

## 2016-09-20 NOTE — Telephone Encounter (Signed)
Denise,I have placed the order, will you please make sure the scan is scheduled. Thanks so much.

## 2016-09-21 ENCOUNTER — Telehealth: Payer: Self-pay | Admitting: Acute Care

## 2016-09-21 ENCOUNTER — Ambulatory Visit: Payer: Medicare Other

## 2016-09-22 NOTE — Telephone Encounter (Signed)
LMOM om machine for TJ that we will be scheduling 3 mth f/u CT 12/2016 and will send results to Dr Inda Merlin when available.

## 2016-12-14 ENCOUNTER — Inpatient Hospital Stay: Admission: RE | Admit: 2016-12-14 | Payer: Medicare Other | Source: Ambulatory Visit

## 2016-12-15 ENCOUNTER — Inpatient Hospital Stay: Admission: RE | Admit: 2016-12-15 | Payer: Medicare Other | Source: Ambulatory Visit

## 2016-12-22 ENCOUNTER — Inpatient Hospital Stay: Admission: RE | Admit: 2016-12-22 | Payer: Medicare Other | Source: Ambulatory Visit

## 2016-12-30 ENCOUNTER — Ambulatory Visit (INDEPENDENT_AMBULATORY_CARE_PROVIDER_SITE_OTHER)
Admission: RE | Admit: 2016-12-30 | Discharge: 2016-12-30 | Disposition: A | Discharge status: Home / Self Care | Payer: Medicare Other | Source: Ambulatory Visit | Attending: Acute Care | Admitting: Acute Care

## 2016-12-30 DIAGNOSIS — R918 Other nonspecific abnormal finding of lung field: Secondary | ICD-10-CM

## 2017-01-13 ENCOUNTER — Telehealth: Payer: Self-pay | Admitting: Acute Care

## 2017-01-13 DIAGNOSIS — R911 Solitary pulmonary nodule: Secondary | ICD-10-CM

## 2017-01-13 NOTE — Telephone Encounter (Signed)
PFT and PET scan ordered.  Spoke with pt's wife and given pt's appt for PFT 01/17/17 9:00.  I advised her that [facility] will be calling her regarding appt for PET scan.  She verbalized understanding.  Nothing further needed.

## 2017-01-13 NOTE — Telephone Encounter (Signed)
I have called Mrs. Okey with the results of Mr. Malone' follow up  CT results. I explained that Mr. Passey' scan has progressed and that we will schedule him for PFT's and a PET scan as further work up. I called [facility] to speak with the patient's PCP, Dr. Inda Merlin, who is out of town. I spoke with Dr. Delfina Redwood and explained that we will order a PET scan and PFT's. He was in agreement with this. I will also message Dr. Inda Merlin through [facility].  Denise please order PET scan and PFT's on Mr. Malloy.Thanks so much

## 2017-01-20 ENCOUNTER — Ambulatory Visit (INDEPENDENT_AMBULATORY_CARE_PROVIDER_SITE_OTHER): Payer: Medicare Other | Admitting: Emergency Medicine

## 2017-01-20 DIAGNOSIS — R911 Solitary pulmonary nodule: Secondary | ICD-10-CM

## 2017-01-20 LAB — PULMONARY FUNCTION TEST
DL/VA % pred: 64 %
DL/VA: 3.09 ml/min/mmHg/L
DLCO cor % pred: 51 %
DLCO cor: 19.28 ml/min/mmHg
DLCO unc % pred: 48 %
DLCO unc: 18.42 ml/min/mmHg
FEF 25-75 Post: 1.32 L/sec
FEF 25-75 Pre: 0.99 L/sec
FEF2575-%Change-Post: 33 %
FEF2575-%Pred-Post: 51 %
FEF2575-%Pred-Pre: 38 %
FEV1-%Change-Post: 8 %
FEV1-%Pred-Post: 70 %
FEV1-%Pred-Pre: 64 %
FEV1-Post: 2.27 L
FEV1-Pre: 2.09 L
FEV1FVC-%Change-Post: 4 %
FEV1FVC-%Pred-Pre: 85 %
FEV6-%Change-Post: 5 %
FEV6-%Pred-Post: 80 %
FEV6-%Pred-Pre: 76 %
FEV6-Post: 3.33 L
FEV6-Pre: 3.16 L
FEV6FVC-%Change-Post: 1 %
FEV6FVC-%Pred-Post: 103 %
FEV6FVC-%Pred-Pre: 101 %
FVC-%Change-Post: 3 %
FVC-%Pred-Post: 78 %
FVC-%Pred-Pre: 75 %
FVC-Post: 3.39 L
FVC-Pre: 3.27 L
Post FEV1/FVC ratio: 67 %
Post FEV6/FVC ratio: 98 %
Pre FEV1/FVC ratio: 64 %
Pre FEV6/FVC Ratio: 97 %
RV % pred: 105 %
RV: 2.97 L
TLC % pred: 89 %
TLC: 7.02 L

## 2017-01-20 NOTE — Progress Notes (Signed)
PFT done today. 

## 2017-01-24 ENCOUNTER — Ambulatory Visit (HOSPITAL_COMMUNITY)
Admission: RE | Admit: 2017-01-24 | Discharge: 2017-01-24 | Disposition: A | Discharge status: Home / Self Care | Payer: Medicare Other | Source: Ambulatory Visit | Attending: Acute Care | Admitting: Acute Care

## 2017-01-24 DIAGNOSIS — I700 Atherosclerosis of aorta: Secondary | ICD-10-CM | POA: Diagnosis not present

## 2017-01-24 DIAGNOSIS — D869 Sarcoidosis, unspecified: Secondary | ICD-10-CM | POA: Diagnosis not present

## 2017-01-24 DIAGNOSIS — R911 Solitary pulmonary nodule: Secondary | ICD-10-CM | POA: Diagnosis not present

## 2017-01-24 DIAGNOSIS — K5730 Diverticulosis of large intestine without perforation or abscess without bleeding: Secondary | ICD-10-CM | POA: Insufficient documentation

## 2017-01-24 DIAGNOSIS — R590 Localized enlarged lymph nodes: Secondary | ICD-10-CM | POA: Diagnosis not present

## 2017-01-24 LAB — GLUCOSE, CAPILLARY: Glucose-Capillary: 88 mg/dL (ref 65–99)

## 2017-01-24 MED ORDER — FLUDEOXYGLUCOSE F - 18 (FDG) INJECTION
8.6000 | Freq: Once | INTRAVENOUS | Status: AC | PRN
Start: 2017-01-24 — End: 2017-01-24
  Administered 2017-01-24: 8.6 via INTRAVENOUS

## 2017-01-26 ENCOUNTER — Ambulatory Visit (INDEPENDENT_AMBULATORY_CARE_PROVIDER_SITE_OTHER): Payer: Medicare Other | Admitting: Emergency Medicine

## 2017-01-26 ENCOUNTER — Encounter: Payer: Self-pay | Admitting: Emergency Medicine

## 2017-01-26 VITALS — BP 132/80 | HR 78 | Ht 74.0 in | Wt 175.0 lb

## 2017-01-26 DIAGNOSIS — C61 Malignant neoplasm of prostate: Secondary | ICD-10-CM

## 2017-01-26 DIAGNOSIS — R918 Other nonspecific abnormal finding of lung field: Secondary | ICD-10-CM

## 2017-01-26 DIAGNOSIS — C3411 Malignant neoplasm of upper lobe, right bronchus or lung: Secondary | ICD-10-CM | POA: Insufficient documentation

## 2017-01-26 DIAGNOSIS — R590 Localized enlarged lymph nodes: Secondary | ICD-10-CM | POA: Insufficient documentation

## 2017-01-26 DIAGNOSIS — R911 Solitary pulmonary nodule: Secondary | ICD-10-CM

## 2017-01-26 DIAGNOSIS — I10 Essential (primary) hypertension: Secondary | ICD-10-CM

## 2017-01-26 DIAGNOSIS — Z720 Tobacco use: Secondary | ICD-10-CM | POA: Insufficient documentation

## 2017-01-26 NOTE — Assessment & Plan Note (Signed)
Plan for endobronchial ultrasound and biopsies

## 2017-01-26 NOTE — Assessment & Plan Note (Signed)
Followed by Dr. Jeffie Pollock with urology

## 2017-01-26 NOTE — Assessment & Plan Note (Signed)
Discussed the importance of cessation with him today.  We will have to work on this as we go forward

## 2017-01-26 NOTE — Patient Instructions (Signed)
We will work on arranging a bronchoscopy to evaluate your pulmonary nodule and your enlarged lymph nodes. We will repeat a CT scan of your chest to facilitate the bronchoscopy. Your pulmonary function testing show some evidence for tobacco related lung disease or COPD.  We will not start a medication at this time but we may need to consider in the future. We need to work hard on decreasing your cigarettes.  Our ultimate goal will be to stop completely. Follow with Dr Lamonte Sakai next available after your procedure to discuss results and plans.

## 2017-01-26 NOTE — Assessment & Plan Note (Signed)
On medical therapy

## 2017-01-26 NOTE — Assessment & Plan Note (Signed)
With associated mediastinal lymphadenopathy, hypermetabolic on PET scan.  Does have a history of prostate cancer but per his report his most recent PSA was normal.  He also has a very remote TB exposure as a child.  He does not have any of the constellation of symptoms that would suggest active TB.  I am most concerned that this is a primary lung cancer with potential mediastinal lymphadenopathy and spread.  Next appropriate step will be tissue diagnosis given the potential for IIIB disease.  We will arrange for bronchoscopy with endobronchial ultrasound, electromagnetic navigation to the right upper lobe nodule with biopsies

## 2017-01-26 NOTE — H&P (View-Only) (Signed)
Subjective:    Patient ID: Jerry Wise, male    DOB: 08/30/1940, 77 y.o.   MRN: [id number]  HPI 77 year old gentleman with tobacco use (pack years), currently smokes approximately 10 a day, with a history of prostate cancer treated by seed implant radiation, hypertension, pernicious anemia, chronic renal insufficiency.  He has participated in the lung cancer screening program beginning 09/2014.  I have reviewed all of his imaging.  His CT scan of the chest 09/13/16 showed a new 6.8 mm posterior segmental right upper lobe nodule.  Three-month follow-up 12/30/16 confirmed progression to 8.4 mm.  Also noted were other scattered pulmonary nodules that were for the most part stable in appearance.  PET scan from 01/24/17 showed that this lesion was hypermetabolic and identified bilateral mediastinal, hilar, infrahilar lymphadenopathy with hypermetabolism.    Denies any SOB. Minimal cough. Never hemoptysis. He may have had a TB exposure as a child with his uncle, used to live with them.    Father and brother had prostatic CA.   PFT 01/22/17 -- moderately severe AFL, no BD response. Normal volumes, decreased DLCO.   PSA last done this year, was normal.   Review of Systems  Past Medical History:  Diagnosis Date  . Chronic renal insufficiency    NEPHROLOGIST-  DR Justin Mend -- LAST VISIT MARCH 2014  . GERD (gastroesophageal reflux disease)   . Gouty arthritis    ALL JOINTS  . History of prostate cancer    S/P RADIACTIVE SEED IMPLANTS  . Hydronephrosis, bilateral   . Hypertension   . 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 Pernicious anemia FOLLOWED BY DR WEBB   x3 feraheme injection --- last one 03-22-2012  . Urethral stricture      No family history on file.   Social History   Socioeconomic History  . Marital status: Married    Spouse name: Not on file  . Number of children: Not on file  . Years of education:  Not on file  . Highest education level: Not on file  Social Needs  . Financial resource strain: Not on file  . Food insecurity - worry: Not on file  . Food insecurity - inability: Not on file  . Transportation needs - medical: Not on file  . Transportation needs - non-medical: Not on file  Occupational History  . Not on file  Tobacco Use  . Smoking status: Current Every Day Smoker    Packs/day: 0.50    Years: 55.00    Pack years: 27.50    Types: Cigarettes  . Smokeless tobacco: Never Used  Substance and Sexual Activity  . Alcohol use: Yes    Alcohol/week: 8.4 oz    Types: 14 Standard drinks or equivalent per week    Comment: 2 SHOT DAILY  . Drug use: No  . Sexual activity: Not on file  Other Topics Concern  . Not on file  Social History Narrative  . 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  [place] native  No Known Allergies   Outpatient Medications Prior to Visit  Medication Sig Dispense Refill  . allopurinol (ZYLOPRIM) 300 MG tablet Take 300 mg by mouth every morning.     Marland Kitchen amLODipine (NORVASC) 10 MG tablet Take 10 mg by mouth every morning.     Marland Kitchen atenolol (TENORMIN) 100  MG tablet Take 100 mg by mouth every morning.     . hydrochlorothiazide (MICROZIDE) 12.5 MG capsule Take 12.5 mg by mouth daily.    Marland Kitchen losartan (COZAAR) 100 MG tablet Take 100 mg by mouth daily.    . Multiple Vitamin (MULTIVITAMIN) tablet Take 1 tablet by mouth daily.    . cephALEXin (KEFLEX) 500 MG capsule Take 1 capsule (500 mg total) by mouth 3 (three) times daily. 21 capsule 0  . HYDROcodone-acetaminophen (NORCO) 5-325 MG per tablet Take 1 tablet by mouth every 6 (six) hours as needed for pain. 20 tablet 0  . HYDROcodone-acetaminophen (NORCO/VICODIN) 5-325 MG per tablet Take 1-2 tablets by mouth every 4 (four) hours as needed for pain. 20 tablet 1  . omeprazole (PRILOSEC) 20 MG capsule Take 20 mg by mouth as needed.     .  phenazopyridine (PYRIDIUM) 200 MG tablet Take 1 tablet (200 mg total) by mouth 3 (three) times daily as needed for pain. 15 tablet 1   No facility-administered medications prior to visit.         Objective:   Physical Exam Vitals:   01/26/17 1330 01/26/17 1331  BP:  132/80  Pulse:  78  SpO2:  97%  Weight: 175 lb (79.4 kg)   Height: 6\' 2"  (1.88 m)    Gen: Pleasant, thiin, in no distress,  normal affect  ENT: No lesions,  mouth clear,  oropharynx clear, no postnasal drip  Neck: No JVD, no stridor  Lungs: No use of accessory muscles, clear without rales or rhonchi  Cardiovascular: RRR, heart sounds normal, no murmur or gallops, no peripheral edema  Musculoskeletal: No deformities, no cyanosis or clubbing  Neuro: alert, non focal  Skin: Warm, no lesions or rash     Assessment & Plan:  Pulmonary nodule With associated mediastinal lymphadenopathy, hypermetabolic on PET scan.  Does have a history of prostate cancer but per his report his most recent PSA was normal.  He also has a very remote TB exposure as a child.  He does not have any of the constellation of symptoms that would suggest active TB.  I am most concerned that this is a primary lung cancer with potential mediastinal lymphadenopathy and spread.  Next appropriate step will be tissue diagnosis given the potential for IIIB disease.  We will arrange for bronchoscopy with endobronchial ultrasound, electromagnetic navigation to the right upper lobe nodule with biopsies  Tobacco use Discussed the importance of cessation with him today.  We will have to work on this as we go forward  Mediastinal lymphadenopathy Plan for endobronchial ultrasound and biopsies  Prostate cancer ([place]) Followed by Dr. Jeffie Pollock with urology  Hypertension On medical therapy  [name], MD, PhD 01/26/2017, 2:06 PM [facility] [phone] or if no answer [phone]

## 2017-01-26 NOTE — Progress Notes (Signed)
Subjective:    Patient ID: Jerry Wise, male    DOB: [birth year], 77 y.o.   MRN: [id number]  HPI 77 year old gentleman with tobacco use (pack years), currently smokes approximately 10 a day, with a history of prostate cancer treated by seed implant radiation, hypertension, pernicious anemia, chronic renal insufficiency.  He has participated in the lung cancer screening program beginning 09/2014.  I have reviewed all of his imaging.  His CT scan of the chest 09/13/16 showed a new 6.8 mm posterior segmental right upper lobe nodule.  Three-month follow-up 12/30/16 confirmed progression to 8.4 mm.  Also noted were other scattered pulmonary nodules that were for the most part stable in appearance.  PET scan from 01/24/17 showed that this lesion was hypermetabolic and identified bilateral mediastinal, hilar, infrahilar lymphadenopathy with hypermetabolism.    Denies any SOB. Minimal cough. Never hemoptysis. He may have had a TB exposure as a child with his uncle, used to live with them.    Father and brother had prostatic CA.   PFT 01/22/17 -- moderately severe AFL, no BD response. Normal volumes, decreased DLCO.   PSA last done this year, was normal.   Review of Systems  Past Medical History:  Diagnosis Date  . Chronic renal insufficiency    NEPHROLOGIST-  DR Justin Mend -- LAST VISIT MARCH 2014  . GERD (gastroesophageal reflux disease)   . Gouty arthritis    ALL JOINTS  . History of prostate cancer    S/P RADIACTIVE SEED IMPLANTS  . Hydronephrosis, bilateral   . Hypertension   . 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 Pernicious anemia FOLLOWED BY DR WEBB   x3 feraheme injection --- last one 03-22-2012  . Urethral stricture      No family history on file.   Social History   Socioeconomic History  . Marital status: Married    Spouse name: Not on file  . Number of children: Not on file  . Years of education:  Not on file  . Highest education level: Not on file  Social Needs  . Financial resource strain: Not on file  . Food insecurity - worry: Not on file  . Food insecurity - inability: Not on file  . Transportation needs - medical: Not on file  . Transportation needs - non-medical: Not on file  Occupational History  . Not on file  Tobacco Use  . Smoking status: Current Every Day Smoker    Packs/day: 0.50    Years: 55.00    Pack years: 27.50    Types: Cigarettes  . Smokeless tobacco: Never Used  Substance and Sexual Activity  . Alcohol use: Yes    Alcohol/week: 8.4 oz    Types: 14 Standard drinks or equivalent per week    Comment: 2 SHOT DAILY  . Drug use: No  . Sexual activity: Not on file  Other Topics Concern  . Not on file  Social History Narrative  . 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  [place] native  No Known Allergies   Outpatient Medications Prior to Visit  Medication Sig Dispense Refill  . allopurinol (ZYLOPRIM) 300 MG tablet Take 300 mg by mouth every morning.     Marland Kitchen amLODipine (NORVASC) 10 MG tablet Take 10 mg by mouth every morning.     Marland Kitchen atenolol (TENORMIN) 100  MG tablet Take 100 mg by mouth every morning.     . hydrochlorothiazide (MICROZIDE) 12.5 MG capsule Take 12.5 mg by mouth daily.    Marland Kitchen losartan (COZAAR) 100 MG tablet Take 100 mg by mouth daily.    . Multiple Vitamin (MULTIVITAMIN) tablet Take 1 tablet by mouth daily.    . cephALEXin (KEFLEX) 500 MG capsule Take 1 capsule (500 mg total) by mouth 3 (three) times daily. 21 capsule 0  . HYDROcodone-acetaminophen (NORCO) 5-325 MG per tablet Take 1 tablet by mouth every 6 (six) hours as needed for pain. 20 tablet 0  . HYDROcodone-acetaminophen (NORCO/VICODIN) 5-325 MG per tablet Take 1-2 tablets by mouth every 4 (four) hours as needed for pain. 20 tablet 1  . omeprazole (PRILOSEC) 20 MG capsule Take 20 mg by mouth as needed.     .  phenazopyridine (PYRIDIUM) 200 MG tablet Take 1 tablet (200 mg total) by mouth 3 (three) times daily as needed for pain. 15 tablet 1   No facility-administered medications prior to visit.         Objective:   Physical Exam Vitals:   01/26/17 1330 01/26/17 1331  BP:  132/80  Pulse:  78  SpO2:  97%  Weight: 175 lb (79.4 kg)   Height: 6\' 2"  (1.88 m)    Gen: Pleasant, thiin, in no distress,  normal affect  ENT: No lesions,  mouth clear,  oropharynx clear, no postnasal drip  Neck: No JVD, no stridor  Lungs: No use of accessory muscles, clear without rales or rhonchi  Cardiovascular: RRR, heart sounds normal, no murmur or gallops, no peripheral edema  Musculoskeletal: No deformities, no cyanosis or clubbing  Neuro: alert, non focal  Skin: Warm, no lesions or rash     Assessment & Plan:  Pulmonary nodule With associated mediastinal lymphadenopathy, hypermetabolic on PET scan.  Does have a history of prostate cancer but per his report his most recent PSA was normal.  He also has a very remote TB exposure as a child.  He does not have any of the constellation of symptoms that would suggest active TB.  I am most concerned that this is a primary lung cancer with potential mediastinal lymphadenopathy and spread.  Next appropriate step will be tissue diagnosis given the potential for IIIB disease.  We will arrange for bronchoscopy with endobronchial ultrasound, electromagnetic navigation to the right upper lobe nodule with biopsies  Tobacco use Discussed the importance of cessation with him today.  We will have to work on this as we go forward  Mediastinal lymphadenopathy Plan for endobronchial ultrasound and biopsies  Prostate cancer ([place]) Followed by Dr. Jeffie Pollock with urology  Hypertension On medical therapy  [name], MD, PhD 01/26/2017, 2:06 PM [facility] [phone] or if no answer [phone]

## 2017-01-31 ENCOUNTER — Ambulatory Visit (INDEPENDENT_AMBULATORY_CARE_PROVIDER_SITE_OTHER)
Admission: RE | Admit: 2017-01-31 | Discharge: 2017-01-31 | Disposition: A | Discharge status: Home / Self Care | Payer: Medicare Other | Source: Ambulatory Visit | Attending: Emergency Medicine | Admitting: Emergency Medicine

## 2017-01-31 DIAGNOSIS — R918 Other nonspecific abnormal finding of lung field: Secondary | ICD-10-CM | POA: Diagnosis not present

## 2017-02-02 ENCOUNTER — Ambulatory Visit (HOSPITAL_COMMUNITY): Payer: Medicare Other | Admitting: Certified Registered Nurse Anesthetist

## 2017-02-02 ENCOUNTER — Other Ambulatory Visit: Payer: Self-pay

## 2017-02-02 ENCOUNTER — Encounter (HOSPITAL_COMMUNITY): Payer: Self-pay | Admitting: *Deleted

## 2017-02-02 ENCOUNTER — Ambulatory Visit (HOSPITAL_COMMUNITY): Payer: Medicare Other

## 2017-02-02 ENCOUNTER — Ambulatory Visit (HOSPITAL_COMMUNITY)
Admission: RE | Admit: 2017-02-02 | Discharge: 2017-02-02 | Disposition: A | Discharge status: Home / Self Care | Payer: Medicare Other | Source: Ambulatory Visit | Attending: Emergency Medicine | Admitting: Emergency Medicine

## 2017-02-02 ENCOUNTER — Encounter (HOSPITAL_COMMUNITY)
Admission: RE | Disposition: A | Discharge status: Home / Self Care | Payer: Self-pay | Source: Ambulatory Visit | Attending: Emergency Medicine

## 2017-02-02 DIAGNOSIS — R911 Solitary pulmonary nodule: Secondary | ICD-10-CM

## 2017-02-02 DIAGNOSIS — N35919 Unspecified urethral stricture, male, unspecified site: Secondary | ICD-10-CM | POA: Diagnosis not present

## 2017-02-02 DIAGNOSIS — N189 Chronic kidney disease, unspecified: Secondary | ICD-10-CM | POA: Insufficient documentation

## 2017-02-02 DIAGNOSIS — C3411 Malignant neoplasm of upper lobe, right bronchus or lung: Secondary | ICD-10-CM | POA: Diagnosis present

## 2017-02-02 DIAGNOSIS — F17210 Nicotine dependence, cigarettes, uncomplicated: Secondary | ICD-10-CM | POA: Insufficient documentation

## 2017-02-02 DIAGNOSIS — Z09 Encounter for follow-up examination after completed treatment for conditions other than malignant neoplasm: Secondary | ICD-10-CM

## 2017-02-02 DIAGNOSIS — Z79899 Other long term (current) drug therapy: Secondary | ICD-10-CM | POA: Insufficient documentation

## 2017-02-02 DIAGNOSIS — R590 Localized enlarged lymph nodes: Secondary | ICD-10-CM | POA: Insufficient documentation

## 2017-02-02 DIAGNOSIS — D510 Vitamin B12 deficiency anemia due to intrinsic factor deficiency: Secondary | ICD-10-CM | POA: Insufficient documentation

## 2017-02-02 DIAGNOSIS — Z201 Contact with and (suspected) exposure to tuberculosis: Secondary | ICD-10-CM | POA: Insufficient documentation

## 2017-02-02 DIAGNOSIS — I129 Hypertensive chronic kidney disease with stage 1 through stage 4 chronic kidney disease, or unspecified chronic kidney disease: Secondary | ICD-10-CM | POA: Diagnosis not present

## 2017-02-02 DIAGNOSIS — K219 Gastro-esophageal reflux disease without esophagitis: Secondary | ICD-10-CM | POA: Diagnosis not present

## 2017-02-02 DIAGNOSIS — Z8546 Personal history of malignant neoplasm of prostate: Secondary | ICD-10-CM | POA: Diagnosis not present

## 2017-02-02 DIAGNOSIS — Z9889 Other specified postprocedural states: Secondary | ICD-10-CM

## 2017-02-02 HISTORY — PX: VIDEO BRONCHOSCOPY WITH ENDOBRONCHIAL NAVIGATION: SHX6175

## 2017-02-02 HISTORY — PX: VIDEO BRONCHOSCOPY WITH ENDOBRONCHIAL ULTRASOUND: SHX6177

## 2017-02-02 LAB — COMPREHENSIVE METABOLIC PANEL
ALK PHOS: 95 U/L (ref 38–126)
ALT: 19 U/L (ref 17–63)
AST: 27 U/L (ref 15–41)
Albumin: 3.6 g/dL (ref 3.5–5.0)
Anion gap: 10 (ref 5–15)
BUN: 18 mg/dL (ref 6–20)
CALCIUM: 9 mg/dL (ref 8.9–10.3)
CO2: 22 mmol/L (ref 22–32)
CREATININE: 1.56 mg/dL — AB (ref 0.61–1.24)
Chloride: 105 mmol/L (ref 101–111)
GFR calc Af Amer: 48 mL/min — ABNORMAL LOW (ref >60)
GFR calc non Af Amer: 41 mL/min — ABNORMAL LOW (ref >60)
Glucose, Bld: 93 mg/dL (ref 65–99)
Potassium: 3.2 mmol/L — ABNORMAL LOW (ref 3.5–5.1)
SODIUM: 137 mmol/L (ref 135–145)
Total Bilirubin: 1 mg/dL (ref 0.3–1.2)
Total Protein: 6.6 g/dL (ref 6.5–8.1)

## 2017-02-02 LAB — APTT: aPTT: 35 seconds (ref 24–36)

## 2017-02-02 LAB — CBC
HCT: 46.8 % (ref 39.0–52.0)
Hemoglobin: 16.5 g/dL (ref 13.0–17.0)
MCH: 32.4 pg (ref 26.0–34.0)
MCHC: 35.3 g/dL (ref 30.0–36.0)
MCV: 91.8 fL (ref 78.0–100.0)
PLATELETS: 310 10*3/uL (ref 150–400)
RBC: 5.1 MIL/uL (ref 4.22–5.81)
RDW: 13.1 % (ref 11.5–15.5)
WBC: 6.2 10*3/uL (ref 4.0–10.5)

## 2017-02-02 LAB — PROTIME-INR
INR: 0.99
PROTHROMBIN TIME: 13 s (ref 11.4–15.2)

## 2017-02-02 SURGERY — VIDEO BRONCHOSCOPY WITH ENDOBRONCHIAL NAVIGATION
Anesthesia: General | Site: Chest

## 2017-02-02 MED ORDER — SODIUM CHLORIDE 0.9 % IV SOLN
INTRAVENOUS | Status: DC
  Administered 2017-02-02: 11:00:00 via INTRAVENOUS

## 2017-02-02 MED ORDER — ROCURONIUM BROMIDE 10 MG/ML (PF) SYRINGE
PREFILLED_SYRINGE | INTRAVENOUS | Status: AC
  Filled 2017-02-02: qty 10

## 2017-02-02 MED ORDER — FENTANYL CITRATE (PF) 250 MCG/5ML IJ SOLN
INTRAMUSCULAR | Status: DC | PRN
  Administered 2017-02-02: 50 ug via INTRAVENOUS
  Administered 2017-02-02: 100 ug via INTRAVENOUS

## 2017-02-02 MED ORDER — PROPOFOL 10 MG/ML IV BOLUS
INTRAVENOUS | Status: AC
  Filled 2017-02-02: qty 20

## 2017-02-02 MED ORDER — FENTANYL CITRATE (PF) 250 MCG/5ML IJ SOLN
INTRAMUSCULAR | Status: AC
Start: 2017-02-02 — End: 2017-02-02
  Filled 2017-02-02: qty 5

## 2017-02-02 MED ORDER — SUGAMMADEX SODIUM 200 MG/2ML IV SOLN
INTRAVENOUS | Status: DC | PRN
  Administered 2017-02-02: 200 mg via INTRAVENOUS

## 2017-02-02 MED ORDER — SUGAMMADEX SODIUM 200 MG/2ML IV SOLN
INTRAVENOUS | Status: AC
  Filled 2017-02-02: qty 2

## 2017-02-02 MED ORDER — ONDANSETRON HCL 4 MG/2ML IJ SOLN
INTRAMUSCULAR | Status: AC
  Filled 2017-02-02: qty 2

## 2017-02-02 MED ORDER — LIDOCAINE 2% (20 MG/ML) 5 ML SYRINGE
INTRAMUSCULAR | Status: AC
  Filled 2017-02-02: qty 10

## 2017-02-02 MED ORDER — DEXAMETHASONE SODIUM PHOSPHATE 10 MG/ML IJ SOLN
INTRAMUSCULAR | Status: DC | PRN
  Administered 2017-02-02: 10 mg via INTRAVENOUS

## 2017-02-02 MED ORDER — SUGAMMADEX SODIUM 500 MG/5ML IV SOLN
INTRAVENOUS | Status: AC
  Filled 2017-02-02: qty 5

## 2017-02-02 MED ORDER — PHENYLEPHRINE HCL 10 MG/ML IJ SOLN
INTRAVENOUS | Status: DC | PRN
  Administered 2017-02-02: 25 ug/min via INTRAVENOUS

## 2017-02-02 MED ORDER — PHENYLEPHRINE 40 MCG/ML (10ML) SYRINGE FOR IV PUSH (FOR BLOOD PRESSURE SUPPORT)
PREFILLED_SYRINGE | INTRAVENOUS | Status: AC
  Filled 2017-02-02: qty 10

## 2017-02-02 MED ORDER — 0.9 % SODIUM CHLORIDE (POUR BTL) OPTIME
TOPICAL | Status: DC | PRN
  Administered 2017-02-02: 1000 mL

## 2017-02-02 MED ORDER — ROCURONIUM BROMIDE 10 MG/ML (PF) SYRINGE
PREFILLED_SYRINGE | INTRAVENOUS | Status: DC | PRN
  Administered 2017-02-02: 50 mg via INTRAVENOUS

## 2017-02-02 MED ORDER — LIDOCAINE 2% (20 MG/ML) 5 ML SYRINGE
INTRAMUSCULAR | Status: DC | PRN
  Administered 2017-02-02: 100 mg via INTRAVENOUS

## 2017-02-02 MED ORDER — ONDANSETRON HCL 4 MG/2ML IJ SOLN
INTRAMUSCULAR | Status: DC | PRN
  Administered 2017-02-02: 4 mg via INTRAVENOUS

## 2017-02-02 MED ORDER — PROPOFOL 10 MG/ML IV BOLUS
INTRAVENOUS | Status: DC | PRN
  Administered 2017-02-02: 50 mg via INTRAVENOUS
  Administered 2017-02-02: 150 mg via INTRAVENOUS

## 2017-02-02 MED ORDER — ATENOLOL 100 MG PO TABS
100.0000 mg | ORAL_TABLET | Freq: Once | ORAL | Status: AC
  Administered 2017-02-02: 100 mg via ORAL
  Filled 2017-02-02: qty 1

## 2017-02-02 SURGICAL SUPPLY — 44 items
ADAPTER BRONCH F/PENTAX (ADAPTER) ×6 IMPLANT
BRUSH CYTOL CELLEBRITY 1.5X140 (MISCELLANEOUS) ×3 IMPLANT
BRUSH SUPERTRAX BIOPSY (INSTRUMENTS) IMPLANT
BRUSH SUPERTRAX NDL-TIP CYTO (INSTRUMENTS) ×9 IMPLANT
CANISTER SUCT 3000ML PPV (MISCELLANEOUS) ×3 IMPLANT
CHANNEL WORK EXTEND EDGE 180 (KITS) IMPLANT
CHANNEL WORK EXTEND EDGE 45 (KITS) IMPLANT
CHANNEL WORK EXTEND EDGE 90 (KITS) IMPLANT
CONT SPEC 4OZ CLIKSEAL STRL BL (MISCELLANEOUS) ×3 IMPLANT
COVER BACK TABLE 60X90IN (DRAPES) ×3 IMPLANT
FILTER STRAW FLUID ASPIR (MISCELLANEOUS) IMPLANT
FORCEPS BIOP SUPERTRX PREMAR (INSTRUMENTS) ×6 IMPLANT
GAUZE SPONGE 4X4 12PLY STRL (GAUZE/BANDAGES/DRESSINGS) ×3 IMPLANT
GLOVE BIO SURGEON STRL SZ7.5 (GLOVE) ×6 IMPLANT
GLOVE BIOGEL PI IND STRL 6.5 (GLOVE) ×1 IMPLANT
GLOVE BIOGEL PI INDICATOR 6.5 (GLOVE) ×2
GLOVE SURG SS PI 6.5 STRL IVOR (GLOVE) ×3 IMPLANT
GOWN STRL NON-REIN LRG LVL3 (GOWN DISPOSABLE) ×3 IMPLANT
GOWN STRL REUS W/ TWL LRG LVL3 (GOWN DISPOSABLE) ×2 IMPLANT
GOWN STRL REUS W/TWL LRG LVL3 (GOWN DISPOSABLE) ×4
KIT CLEAN ENDO COMPLIANCE (KITS) ×3 IMPLANT
KIT LOCATABLE GUIDE (CANNULA) IMPLANT
KIT MARKER FIDUCIAL DELIVERY (KITS) ×3 IMPLANT
KIT PROCEDURE EDGE 180 (KITS) ×3 IMPLANT
KIT PROCEDURE EDGE 45 (KITS) IMPLANT
KIT PROCEDURE EDGE 90 (KITS) IMPLANT
KIT ROOM TURNOVER OR (KITS) ×3 IMPLANT
MARKER FIDUCIAL SL NIT COIL (Implant Marker) ×9 IMPLANT
MARKER SKIN DUAL TIP RULER LAB (MISCELLANEOUS) ×3 IMPLANT
NEEDLE EBUS SONO TIP PENTAX (NEEDLE) ×6 IMPLANT
NEEDLE SUPERTRX PREMARK BIOPSY (NEEDLE) ×6 IMPLANT
NS IRRIG 1000ML POUR BTL (IV SOLUTION) ×3 IMPLANT
OIL SILICONE PENTAX (PARTS (SERVICE/REPAIRS)) ×3 IMPLANT
PAD ARMBOARD 7.5X6 YLW CONV (MISCELLANEOUS) ×6 IMPLANT
PATCHES PATIENT (LABEL) ×9 IMPLANT
SYR 20CC LL (SYRINGE) ×3 IMPLANT
SYR 20ML ECCENTRIC (SYRINGE) ×3 IMPLANT
SYR 50ML SLIP (SYRINGE) ×3 IMPLANT
TOWEL OR 17X24 6PK STRL BLUE (TOWEL DISPOSABLE) ×3 IMPLANT
TRAP SPECIMEN MUCOUS 40CC (MISCELLANEOUS) IMPLANT
TUBE CONNECTING 20'X1/4 (TUBING) ×1
TUBE CONNECTING 20X1/4 (TUBING) ×2 IMPLANT
UNDERPAD 30X30 (UNDERPADS AND DIAPERS) ×3 IMPLANT
WATER STERILE IRR 1000ML POUR (IV SOLUTION) ×3 IMPLANT

## 2017-02-02 NOTE — Discharge Instructions (Signed)
Flexible Bronchoscopy, Care After These instructions give you information on caring for yourself after your procedure. Your doctor may also give you more specific instructions. Call your doctor if you have any problems or questions after your procedure. Follow these instructions at home:  Do not eat or drink anything for 2 hours after your procedure. If you try to eat or drink before the medicine wears off, food or drink could go into your lungs. You could also burn yourself.  After 2 hours have passed and when you can cough and gag normally, you may eat soft food and drink liquids slowly.  The day after the test, you may eat your normal diet.  You may do your normal activities.  Keep all doctor visits. Get help right away if:  You get more and more short of breath.  You get light-headed.  You feel like you are going to pass out (faint).  You have chest pain.  You have new problems that worry you.  You cough up more than a little blood.  You cough up more blood than before.  Please call our office for any questions or concerns.  [phone].    This information is not intended to replace advice given to you by your health care provider. Make sure you discuss any questions you have with your health care provider. Document Released: 10/18/2008 Document Revised: 05/29/2015 Document Reviewed: 08/25/2012 Elsevier Interactive Patient Education  2017 [employer].

## 2017-02-02 NOTE — Op Note (Signed)
Video Bronchoscopy with Endobronchial Ultrasound and Electromagnetic Navigation Procedure Note  Date of Operation: 02/02/2017  Pre-op Diagnosis: Mediastinal and hilar lymphadenopathy, right upper lobe nodule  Post-op Diagnosis: Same  Surgeon: [name]  Assistants: None  Anesthesia: General endotracheal anesthesia  Operation: Flexible video fiberoptic bronchoscopy with endobronchial ultrasound and biopsies.  Estimated Blood Loss: Minimal  Complications: None apparent  Indications and History: Jerry Wise is a 77 y.o. male with history of tobacco use use, prostate cancer.  He was found to have mediastinal lymphadenopathy and a small right upper lobe nodule.  Recommendation was made to achieve tissue diagnosis via nodal biopsies and navigational bronchoscopy.  The risks, benefits, complications, treatment options and expected outcomes were discussed with the patient.  The possibilities of pneumothorax, pneumonia, reaction to medication, pulmonary aspiration, perforation of a viscus, bleeding, failure to diagnose a condition and creating a complication requiring transfusion or operation were discussed with the patient who freely signed the consent.    Description of Procedure: The patient was examined in the preoperative area and history and data from the preprocedure consultation were reviewed. It was deemed appropriate to proceed.  The patient was taken to OR 10, identified as Jerry Wise and the procedure verified as Flexible Video Fiberoptic Bronchoscopy.  A Time Out was held and the above information confirmed. After being taken to the operating room general anesthesia was initiated and the patient  was orally intubated. The video fiberoptic bronchoscope was introduced via the endotracheal tube and a general inspection was performed which showed normal airways throughout; there was some thin secretions that were easily suctioned. The standard scope was then withdrawn and the  endobronchial ultrasound was used to identify and characterize the peritracheal, hilar and bronchial lymph nodes. Inspection showed enlargement of 4L, 4R, 11 R nodes. Using real-time ultrasound guidance Wang needle biopsies were take from Station 4L, 4R, 11 R nodes and were sent for cytology.   Prior to the date of the procedure a high-resolution CT scan of the chest was performed. Utilizing [place] a virtual tracheobronchial tree was generated to allow the creation of distinct navigation pathways to the patient's parenchymal abnormalities. The extendable working channel and locator guide were introduced into the bronchoscope. The distinct navigation pathways prepared prior to this procedure were then utilized to navigate to within 0.5 of patient's lesion identified on CT scan. The extendable working channel was secured into place and the locator guide was withdrawn. Under fluoroscopic guidance transbronchial needle brushings, transbronchial Wang needle biopsies, and transbronchial forceps biopsies were performed to be sent for cytology and pathology. A bronchioalveolar lavage was performed in the RUL and sent for cytology and microbiology (bacterial, fungal, AFB smears and cultures).  3 fiducial markers were placed triangulating the right upper lobe pulmonary nodule to facilitate radiation therapy should it be indicated.  At the end of the procedure a general airway inspection was performed and there was no evidence of active bleeding. The bronchoscope was removed.  The patient tolerated the procedure well. There was no significant blood loss and there were no obvious complications. A post-procedural chest x-ray is pending.   Samples: 1. Wang needle biopsies from 4L node 2. Wang needle biopsies from 4R node 3. Wang needle biopsies from 11R node 4. Transbronchial needle brushings from right upper lobe nodule 5. Transbronchial Wang needle biopsies from right upper lobe nodule 6.  Transbronchial forceps biopsies from right upper lobe nodule 7. Bronchoalveolar lavage from right upper lobe  Plans:  The patient will  be discharged from the PACU to home when recovered from anesthesia and after chest x-ray is reviewed. We will review the cytology, pathology and microbiology results with the patient when they become available. Outpatient followup will be with Dr Lamonte Sakai.    [name], MD, PhD 02/02/2017, 1:21 PM [facility] [phone] or if no answer [phone]

## 2017-02-02 NOTE — Interval H&P Note (Signed)
PCCM Interval Note  77 year old man, current smoker with a history of prostate cancer and hypertension.  He was found to have a right upper lobe nodule on lung cancer screening study.  A PET scan revealed that it was mildly hypermetabolic, also revealed significant mediastinal hilar and infrahilar lymphadenopathy with hypermetabolism.  Recommendation was made to seek tissue diagnosis with endobronchial ultrasound and navigational bronchoscopy.  Risks and benefits explained to the patient.  He voiced understanding.  He elects to proceed.  He is feeling well, no new symptoms.  No dyspnea, chest pain, cough, sputum production.  He is still smoking.  Vitals:   02/02/17 0840  BP: (!) 183/84  Pulse: 67  Resp: 18  Temp: 98 F (36.7 C)  TempSrc: Oral  SpO2: 100%  Weight: 79.4 kg (175 lb)  Height: 6\' 2"  (1.88 m)   Gen: Pleasant, thin, in no distress,  normal affect  ENT: No lesions,  mouth clear,  oropharynx clear, M1 airway  Neck: No JVD, no stridor  Lungs: No use of accessory muscles, clear without rales or rhonchi  Cardiovascular: RRR, heart sounds normal, no murmur or gallops, no peripheral edema  Abdomen: soft and NT, no HSM,  BS normal  Musculoskeletal: No deformities, no cyanosis or clubbing  Neuro: alert, non focal  Skin: Warm, no lesions or rashes   Impression / Plan: Plan for nodal biopsies, hopefully right upper lobe nodular biopsy and fiducial placement. Labs are pending.  We will review prior to procedure Patient agrees with the plan. No contraindications noted thus far. Will review labs, proceed if no issues.   [name], MD, PhD 02/02/2017, 9:06 AM [facility] [phone] or if no answer [phone]

## 2017-02-02 NOTE — Anesthesia Postprocedure Evaluation (Signed)
Anesthesia Post Note  Patient: Jerry Wise  Procedure(s) Performed: VIDEO BRONCHOSCOPY WITH ENDOBRONCHIAL NAVIGATION  with biopsies  and Fiducial Placement (N/A Chest) VIDEO BRONCHOSCOPY WITH ENDOBRONCHIAL ULTRASOUND  with node biopsies (N/A Chest)     Anesthesia Post Evaluation  Last Vitals:  Vitals:   02/02/17 1448 02/02/17 1452  BP: (!) 142/82 (!) 159/88  Pulse: 71 72  Resp: 17 18  Temp: 36.6 C   SpO2: 95% 96%    Last Pain:  Vitals:   02/02/17 1452  TempSrc:   PainSc: 0-No pain                 Barnet Glasgow

## 2017-02-02 NOTE — Anesthesia Preprocedure Evaluation (Addendum)
Anesthesia Evaluation  Patient identified by MRN, date of birth, ID band Patient awake    Reviewed: Allergy & Precautions, NPO status , Patient's Chart, lab work & pertinent test results  Airway Mallampati: II  TM Distance: >3 FB Neck ROM: Full    Dental  (+) Edentulous Upper, Edentulous Lower   Pulmonary neg pulmonary ROS, Current Smoker,    Pulmonary exam normal breath sounds clear to auscultation       Cardiovascular hypertension, negative cardio ROS Normal cardiovascular exam Rhythm:Regular Rate:Normal     Neuro/Psych negative neurological ROS  negative psych ROS   GI/Hepatic negative GI ROS, Neg liver ROS,   Endo/Other  negative endocrine ROS  Renal/GU negative Renal ROS  negative genitourinary   Musculoskeletal negative musculoskeletal ROS (+)   Abdominal   Peds  Hematology negative hematology ROS (+)   Anesthesia Other Findings   Reproductive/Obstetrics                            Anesthesia Physical Anesthesia Plan  ASA: III  Anesthesia Plan: General   Post-op Pain Management:    Induction: Intravenous  PONV Risk Score and Plan: 1 and Treatment may vary due to age or medical condition, Ondansetron and Dexamethasone  Airway Management Planned: Oral ETT  Additional Equipment:   Intra-op Plan:   Post-operative Plan: Extubation in OR  Informed Consent: I have reviewed the patients History and Physical, chart, labs and discussed the procedure including the risks, benefits and alternatives for the proposed anesthesia with the patient or authorized representative who has indicated his/her understanding and acceptance.   Dental advisory given  Plan Discussed with: CRNA  Anesthesia Plan Comments:         Anesthesia Quick Evaluation

## 2017-02-02 NOTE — Transfer of Care (Signed)
Immediate Anesthesia Transfer of Care Note  Patient: Jerry Wise  Procedure(s) Performed: VIDEO BRONCHOSCOPY WITH ENDOBRONCHIAL NAVIGATION  with biopsies  and Fiducial Placement (N/A Chest) VIDEO BRONCHOSCOPY WITH ENDOBRONCHIAL ULTRASOUND  with node biopsies (N/A Chest)  Patient Location: PACU  Anesthesia Type:General  Level of Consciousness: awake, alert  and oriented  Airway & Oxygen Therapy: Patient Spontanous Breathing and Patient connected to nasal cannula oxygen  Post-op Assessment: Report given to RN and Post -op Vital signs reviewed and stable  Post vital signs: Reviewed and stable  Last Vitals:  Vitals:   02/02/17 0840 02/02/17 1333  BP: (!) 183/84 (!) 151/85  Pulse: 67 77  Resp: 18 18  Temp: 36.7 C 36.5 C  SpO2: 100% 96%    Last Pain:  Vitals:   02/02/17 0840  TempSrc: Oral      Patients Stated Pain Goal: 4 (47/18/55 0158)  Complications: No apparent anesthesia complications

## 2017-02-02 NOTE — Anesthesia Postprocedure Evaluation (Signed)
Anesthesia Post Note  Patient: Jerry Wise  Procedure(s) Performed: VIDEO BRONCHOSCOPY WITH ENDOBRONCHIAL NAVIGATION  with biopsies  and Fiducial Placement (N/A Chest) VIDEO BRONCHOSCOPY WITH ENDOBRONCHIAL ULTRASOUND  with node biopsies (N/A Chest)     Patient location during evaluation: PACU Anesthesia Type: General Level of consciousness: awake and alert Pain management: pain level controlled Vital Signs Assessment: post-procedure vital signs reviewed and stable Respiratory status: spontaneous breathing, nonlabored ventilation, respiratory function stable and patient connected to nasal cannula oxygen Cardiovascular status: blood pressure returned to baseline and stable Postop Assessment: no apparent nausea or vomiting Anesthetic complications: no    Last Vitals:  Vitals:   02/02/17 1448 02/02/17 1452  BP: (!) 142/82 (!) 159/88  Pulse: 71 72  Resp: 17 18  Temp: 36.6 C   SpO2: 95% 96%    Last Pain:  Vitals:   02/02/17 1452  TempSrc:   PainSc: 0-No pain                 Barnet Glasgow

## 2017-02-02 NOTE — Anesthesia Procedure Notes (Addendum)
Procedure Name: Intubation Date/Time: 02/02/2017 11:38 AM Performed by: Valda Favia, CRNA Pre-anesthesia Checklist: Patient identified, Emergency Drugs available, Suction available and Patient being monitored Patient Re-evaluated:Patient Re-evaluated prior to induction Oxygen Delivery Method: Circle System Utilized Preoxygenation: Pre-oxygenation with 100% oxygen Induction Type: IV induction Ventilation: Mask ventilation without difficulty Laryngoscope Size: Mac and 4 Grade View: Grade I Tube type: Oral Tube size: 8.5 mm Number of attempts: 1 Airway Equipment and Method: Stylet Placement Confirmation: ETT inserted through vocal cords under direct vision,  positive ETCO2 and breath sounds checked- equal and bilateral Secured at: 22 cm Tube secured with: Tape Dental Injury: Teeth and Oropharynx as per pre-operative assessment

## 2017-02-03 ENCOUNTER — Encounter (HOSPITAL_COMMUNITY): Payer: Self-pay | Admitting: Emergency Medicine

## 2017-02-03 LAB — ACID FAST SMEAR (AFB, MYCOBACTERIA)

## 2017-02-03 LAB — ACID FAST SMEAR (AFB): ACID FAST SMEAR - AFSCU2: NEGATIVE

## 2017-02-04 ENCOUNTER — Telehealth: Payer: Self-pay | Admitting: Emergency Medicine

## 2017-02-04 DIAGNOSIS — C3490 Malignant neoplasm of unspecified part of unspecified bronchus or lung: Secondary | ICD-10-CM

## 2017-02-04 LAB — CULTURE, RESPIRATORY
CULTURE: NO GROWTH
GRAM STAIN: NONE SEEN

## 2017-02-04 LAB — CULTURE, RESPIRATORY W GRAM STAIN

## 2017-02-04 NOTE — Telephone Encounter (Signed)
Attempted to call patient to review bronchoscopy results. No answer. Left message that I would try him back. Will re-attempt

## 2017-02-08 NOTE — Telephone Encounter (Signed)
Tried again to call [employer], unable to reach them, left a message. 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 Nodes were negative for malignancy. I haven't referred him to Oncology yet since I haven't ben able to get him on the phone.

## 2017-02-09 ENCOUNTER — Telehealth: Payer: Self-pay | Admitting: *Deleted

## 2017-02-09 DIAGNOSIS — R590 Localized enlarged lymph nodes: Secondary | ICD-10-CM

## 2017-02-09 NOTE — Telephone Encounter (Signed)
Oncology Nurse Navigator Documentation  Oncology Nurse Navigator Flowsheets 02/09/2017  Navigator Location [facility]  Referral date to RadOnc/MedOnc 02/09/2017  Navigator Encounter Type Telephone/I received a referral for Jerry Wise from Dr. Lamonte Sakai. I called to schedule patient. Due to Jerry Wise working in the evenings she can not make clinic with her husband. I updated them I would make referral to [facility] and thoracic surgery for earlier appts.  They were thankful for the help.   Telephone Outgoing Call  Treatment Phase Pre-Tx/Tx Discussion  Barriers/Navigation Needs Coordination of Care  Interventions Coordination of Care  Coordination of Care Other  Acuity Level 2  Time Spent with Patient 45

## 2017-02-09 NOTE — Telephone Encounter (Signed)
I reviewed the results with the patient's wife by phone.  The pulmonary nodule showed squamous cell lung cancer.  All of the nodal biopsies (hypermetabolic on PET) were negative for malignancy.  He might be a candidate for surgery although he has a heavy smoking history.  If so he might need EBUS or mediastinoscopy confirmation that his nodes are negative.  Best plan may be stereotactic radiation.  I explained these issues to her.  We will set him up for thoracic oncology clinic to create a treatment plan.  I will order MRI brain

## 2017-02-10 ENCOUNTER — Encounter: Payer: Self-pay | Admitting: Radiation Oncology

## 2017-02-11 NOTE — Progress Notes (Signed)
Thoracic Location of Tumor / Histology:  02/02/17 Diagnosis Lung, biopsy, Right Upper Lobe - SQUAMOUS CELL CARCINOMA  Patient presented months ago with symptoms of: Dr. Lamonte Sakai documents: He has participated in the lung cancer screening program beginning 09/2014.  I have reviewed all of his imaging.  His CT scan of the chest 09/13/16 showed a new 6.8 mm posterior segmental right upper lobe nodule.  Three-month follow-up 12/30/16 confirmed progression to 8.4 mm.  Also noted were other scattered pulmonary nodules that were for the most part stable in appearance.  PET scan from 01/24/17 showed that this lesion was hypermetabolic and identified bilateral mediastinal, hilar, infrahilar lymphadenopathy with hypermetabolism.    Biopsies of Right upper lobe of lung revealed: squamous cell carcinoma.   Tobacco/Marijuana/Snuff/ETOH use: He reports that he quit smoking this past Monday 02/14/17. He did smoke 1/2 pack daily until that time.  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   Past/Anticipated interventions by cardiothoracic surgery, if any:  02/02/17 Dr. Lamonte Sakai Operation: Flexible video fiberoptic bronchoscopy with endobronchial ultrasound and biopsies.  --He has an appointment with Dr. Roxan Hockey 02/15/17 I recommended that we proceed with mediastinoscopy, right VATS for right upper lobectomy.  Obviously if the mediastinal lymph nodes were positive for cancer we will not proceed with the lobectomy.  If they are in fact negative then we would proceed with a lobectomy at the same setting.  He understands this would be done in the hospital under general anesthesia.  We discussed the incisions to be used, the use of a drainage tube postoperatively, the expected hospital stay, and the overall recovery.  I reviewed the indications, risks, benefits, and alternatives with them.  He understands the risk include, but are not limited to death, MI, DVT, PE, stroke, bleeding, possible need for transfusion, infection,  prolonged air leak, as well as the possibility of other unforeseeable complications.  He accepts the risks and wishes to proceed.  His wife tells me that the surgery has been scheduled for 03/03/17.    Past/Anticipated interventions by medical oncology, if any:  None scheduled.    Signs/Symptoms  Weight changes, if any: He denies  Respiratory complaints, if any: He reports an occasional cough that is productive of white sputum.   Hemoptysis, if any: He denies   Pain issues, if any: He denies.    SAFETY ISSUES:  Prior radiation? 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   Pacemaker/ICD? No  Possible current pregnancy? N/A  Is the patient on methotrexate? No  Current Complaints / other details:   02/12/17 MRI Brain  BP (!) 141/77   Pulse 75   Temp 98.8 F (37.1 C)   Ht 6\' 2"  (1.88 m)   Wt 175 lb 6.4 oz (79.6 kg)   SpO2 98% Comment: room air  BMI 22.52 kg/m    Wt Readings from Last 3 Encounters:  02/17/17 175 lb 6.4 oz (79.6 kg)  02/15/17 175 lb (79.4 kg)  02/02/17 175 lb (79.4 kg)

## 2017-02-12 ENCOUNTER — Ambulatory Visit (HOSPITAL_COMMUNITY)
Admission: RE | Admit: 2017-02-12 | Discharge: 2017-02-12 | Disposition: A | Discharge status: Home / Self Care | Payer: Medicare Other | Source: Ambulatory Visit | Attending: Emergency Medicine | Admitting: Emergency Medicine

## 2017-02-12 DIAGNOSIS — I6782 Cerebral ischemia: Secondary | ICD-10-CM | POA: Diagnosis not present

## 2017-02-12 DIAGNOSIS — C3490 Malignant neoplasm of unspecified part of unspecified bronchus or lung: Secondary | ICD-10-CM | POA: Insufficient documentation

## 2017-02-12 MED ORDER — GADOBENATE DIMEGLUMINE 529 MG/ML IV SOLN
15.0000 mL | Freq: Once | INTRAVENOUS | Status: AC | PRN
  Administered 2017-02-12: 15 mL via INTRAVENOUS

## 2017-02-15 ENCOUNTER — Other Ambulatory Visit: Payer: Self-pay | Admitting: *Deleted

## 2017-02-15 ENCOUNTER — Other Ambulatory Visit: Payer: Self-pay

## 2017-02-15 ENCOUNTER — Institutional Professional Consult (permissible substitution): Payer: Medicare Other | Admitting: Thoracic Surgery (Cardiothoracic Vascular Surgery)

## 2017-02-15 ENCOUNTER — Ambulatory Visit: Payer: Medicare Other | Admitting: Radiation Oncology

## 2017-02-15 ENCOUNTER — Encounter: Payer: Self-pay | Admitting: Thoracic Surgery (Cardiothoracic Vascular Surgery)

## 2017-02-15 VITALS — BP 150/86 | HR 70 | Resp 20 | Ht 74.0 in | Wt 175.0 lb

## 2017-02-15 DIAGNOSIS — R911 Solitary pulmonary nodule: Secondary | ICD-10-CM | POA: Diagnosis not present

## 2017-02-15 DIAGNOSIS — C3411 Malignant neoplasm of upper lobe, right bronchus or lung: Secondary | ICD-10-CM

## 2017-02-15 NOTE — H&P (View-Only) (Signed)
PCP is Josetta Huddle, MD Referring Provider is Collene Gobble, MD  Chief Complaint  Patient presents with  . Lung Lesion    new patient, discuss surgery, PET 01/24/2017, CT Chest 01/31/2017    HPI: Jerry Wise is a 77 year old man with a past medical history significant for tobacco abuse, stage III chronic kidney disease, pernicious anemia, hyper parathyroidism status post parathyroidectomy, prostate cancer treated with radioactive seeds, gouty arthritis, and hypertension.  He began participating in the lung cancer screening program in September 2016.  His CT and September 2018 showed a new 6.8 mm right upper lobe nodule.  A follow-up CT in December showed an increase in size to 8.4 mm.  A PET/CT in January showed some metabolic uptake in the nodule.  There was markedly hypermetabolic bilateral mediastinal, hilar, and infrahilar adenopathy.  Dr. Lamonte Sakai did a navigational bronchoscopy and endobronchial ultrasound.  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  Lymph node aspirations were negative for tumor, but were nondiagnostic.  Jerry Wise says that he is not very physically active.  He is somewhat limited by arthritis.  He watches a lot of TV.  He still smokes one half a pack of cigarettes daily.  He lives with his wife.  He is retired from [employer] work.  He denies any chest pain, pressure, or tightness with exertion.  He denies any change in appetite or weight loss.  He has an occasional cough but denies wheezing and shortness of breath.  Zubrod Score: At the time of surgery this patient's most appropriate activity status/level should be described as: []     0    Normal activity, no symptoms [x]     1    Restricted in physical strenuous activity but ambulatory, able to do out light work []     2    Ambulatory and capable of self care, unable to do work activities, up and about >50 % of waking hours                              []     3    Only limited self care, in bed  greater than 50% of waking hours []     4    Completely disabled, no self care, confined to bed or chair []     5    Moribund  Past Medical History:  Diagnosis Date  . Chronic renal insufficiency    NEPHROLOGIST-  DR Justin Mend -- LAST VISIT MARCH 2014  . GERD (gastroesophageal reflux disease)   . Gouty arthritis    ALL JOINTS  . History of prostate cancer    S/P RADIACTIVE SEED IMPLANTS  . Hydronephrosis, bilateral   . Hypertension   . 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 Pernicious anemia FOLLOWED BY DR WEBB   x3 feraheme injection --- last one 03-22-2012  . Urethral stricture     Past Surgical History:  Procedure Laterality Date  . CLOSED REDUCTION CLAVICAL FX     NO HARDWARE  . COLONOSCOPY    . CYSTO/  BALLOON DILATION OF URETHRAL STRICTURE AND BLADDER BX WITH FULGERATION  09-14-2007  . CYSTOSCOPY W/ URETERAL STENT PLACEMENT N/A 04/06/2012   Procedure: CYSTOSCOPY Balloon DILATION OF STRICTURE, Fulgeration Bladder Neck, Cystogram;  Surgeon: Malka So, MD;  Location: [facility];  Service: Urology;  Laterality: N/A;  .  KNEE SURGERY    . MULTIPLE TOOTH EXTRACTIONS    . PARATHYROIDECTOMY Right 04/28/2012   Procedure:  RIGHT INFERIOR PARATHYROIDECTOMY;  Surgeon: Earnstine Regal, MD;  Location: [facility];  Service: General;  Laterality: Right;  . RADIOACTIVE PROSTATE SEED IMPLANTS  2000   prostate cancer  . VIDEO BRONCHOSCOPY WITH ENDOBRONCHIAL NAVIGATION N/A 02/02/2017   Procedure: VIDEO BRONCHOSCOPY WITH ENDOBRONCHIAL NAVIGATION  with biopsies  and Fiducial Placement;  Surgeon: Collene Gobble, MD;  Location: [facility];  Service: Thoracic;  Laterality: N/A;  . VIDEO BRONCHOSCOPY WITH ENDOBRONCHIAL ULTRASOUND N/A 02/02/2017   Procedure: VIDEO BRONCHOSCOPY WITH ENDOBRONCHIAL ULTRASOUND  with node biopsies;  Surgeon: Collene Gobble, MD;  Location: [facility];  Service: Thoracic;  Laterality: N/A;     History reviewed. No pertinent family history.  Social History Social History   Tobacco Use  . Smoking status: Current Every Day Smoker    Packs/day: 0.50    Years: 55.00    Pack years: 27.50    Types: Cigarettes  . Smokeless tobacco: Never Used  Substance Use Topics  . Alcohol use: Yes    Alcohol/week: 8.4 oz    Types: 14 Standard drinks or equivalent per week    Comment: 3 SHOT DAILY  . Drug use: No    Current Outpatient Medications  Medication Sig Dispense Refill  . acetaminophen (TYLENOL) 500 MG tablet Take 1,000 mg by mouth every 8 (eight) hours as needed for mild pain or headache.    . allopurinol (ZYLOPRIM) 300 MG tablet Take 300 mg by mouth every morning.     Marland Kitchen amLODipine (NORVASC) 10 MG tablet Take 10 mg by mouth every morning.     Marland Kitchen aspirin EC 81 MG tablet Take 81 mg by mouth daily.    Marland Kitchen atenolol (TENORMIN) 100 MG tablet Take 100 mg by mouth every morning.     . Cholecalciferol 2000 units CAPS Take 2,000 Units by mouth daily.    . hydrochlorothiazide (MICROZIDE) 12.5 MG capsule Take 12.5 mg by mouth daily.     No current facility-administered medications for this visit.     No Known Allergies  Review of Systems  Constitutional: Negative for activity change, appetite change and unexpected weight change.  HENT: Positive for dental problem. Negative for trouble swallowing and voice change.   Eyes: Negative for visual disturbance.  Respiratory: Positive for cough. Negative for shortness of breath and wheezing.   Cardiovascular: Negative for chest pain and leg swelling.  Gastrointestinal: Negative for abdominal distention and abdominal pain.  Genitourinary: Positive for frequency. Negative for dysuria.  Musculoskeletal: Positive for arthralgias, back pain and joint swelling.  Neurological: Negative for seizures, syncope and headaches.  Hematological: Negative for adenopathy. Does not bruise/bleed easily.  All other systems reviewed and are negative.   BP  (!) 150/86   Pulse 70   Resp 20   Ht 6\' 2"  (1.88 m)   Wt 175 lb (79.4 kg)   SpO2 98% Comment: RA  BMI 22.47 kg/m  Physical Exam  Constitutional: He is oriented to person, place, and time. He appears well-developed and well-nourished. No distress.  HENT:  Head: Normocephalic and atraumatic.  Eyes: Conjunctivae and EOM are normal. No scleral icterus.  Neck: Neck supple. No thyromegaly present.  Well-healed lower cervical incision  Cardiovascular: Normal rate, regular rhythm and normal heart sounds. Exam reveals no gallop and no friction rub.  No murmur heard. Pulmonary/Chest: Effort normal and breath sounds normal. No respiratory distress. He has no wheezes.  He has no rales.  Abdominal: Soft. He exhibits no distension. There is no tenderness.  Musculoskeletal: He exhibits no edema.  Lymphadenopathy:    He has no cervical adenopathy.  Neurological: He is alert and oriented to person, place, and time. No cranial nerve deficit. He exhibits normal muscle tone.  Skin: Skin is warm and dry.  Vitals reviewed.    Diagnostic Tests: CT CHEST WITHOUT CONTRAST  TECHNIQUE: Multidetector CT imaging of the chest was performed using thin slice collimation for electromagnetic bronchoscopy planning purposes, without intravenous contrast.  COMPARISON:  01/24/2017 PET-CT.  12/30/2016 chest CT.  FINDINGS: Cardiovascular: Normal heart size. No significant pericardial fluid/thickening. Left main and 3 vessel coronary atherosclerosis. Atherosclerotic nonaneurysmal thoracic aorta. Normal caliber pulmonary arteries.  Mediastinum/Nodes: No discrete thyroid nodules. Surgical clips are again noted at the lower margin of the right thyroid lobe. Unremarkable esophagus. No axillary adenopathy. Mildly enlarged AP window 1.0 cm node (series 2/image 62). Mildly enlarged 1.1 cm right paratracheal node (series 2/image 61). Mildly enlarged 1.3 cm right infrahilar node (series 2/image 89) and mildly  enlarged 1.0 cm left infrahilar node (series 2/image 80). These nodes were all hypermetabolic on the recent PET-CT.  Lungs/Pleura: No pneumothorax. No pleural effusion. Mild centrilobular emphysema with mild diffuse bronchial wall thickening. No acute consolidative airspace disease or lung masses. There is a dominant partially cavitary 1.0 cm right upper lobe pulmonary nodule (series 3/image 70), not appreciably changed since 12/30/2016 chest CT. There is extensive patchy ground-glass and solid micronodularity in a centrilobular distribution throughout both lungs, predominantly in the upper lobes, not appreciably changed.  Upper abdomen: No acute abnormality.  Musculoskeletal: No aggressive appearing focal osseous lesions. Moderate thoracic spondylosis.  IMPRESSION: 1. Nonspecific mild bilateral mediastinal and bilateral infrahilar adenopathy, not appreciably changed from 01/24/2017 PET-CT, where these nodes were seen to be hypermetabolic. Metastatic nodal disease cannot be excluded. 2. Dominant partially cavitary 1.0 cm right upper lobe pulmonary nodule, not appreciably changed since 12/30/2016 chest CT, which was mildly hypermetabolic on recent PET-CT, cannot exclude primary bronchogenic carcinoma. 3. Background centrilobular micronodularity in both lungs, upper lobe predominant, most suggestive of respiratory bronchiolitis. 4. Left main and 3 vessel coronary atherosclerosis.  Aortic Atherosclerosis (ICD10-I70.0) and Emphysema (ICD10-J43.9).   Electronically Signed   By: Ilona Sorrel M.D.   On: 01/31/2017 12:32 NUCLEAR MEDICINE PET SKULL BASE TO THIGH  TECHNIQUE: 8.6 mCi F-18 FDG was injected intravenously. Full-ring PET imaging was performed from the skull base to thigh after the radiotracer. CT data was obtained and used for attenuation correction and anatomic localization.  FASTING BLOOD GLUCOSE:  Value: 88 mg/dl  COMPARISON:  CT chest  12/30/2016  FINDINGS: NECK  Accentuated posterior nasopharyngeal activity with maximum SUV 8.8.  Chronic right maxillary sinusitis.  CHEST  The right upper lobe nodule measures 1.1 by 0.8 cm on image 29/8 and has a maximum SUV of 2.8.  Hypermetabolic AP window, bilateral hilar, and bilateral infrahilar adenopathy noted. An AP window lymph node measures 0.9 cm in short axis on image 70/4 with maximum SUV 11.4. A right hilar lymph node has maximum SUV 11.6 and accentuated metabolic activity measuring 1.2 cm in short axis. Index right infrahilar lymph node maximum SUV 12.7, with hypermetabolic activity measuring 1.5 cm in short axis.  Scattered centrilobular nodularity in addition to several small nodules in the 3-4 mm range in the upper lobes. These are not appreciably hypermetabolic. Bilateral airway thickening is present but does not appear nodular.  Coronary, aortic arch, and branch vessel  atherosclerotic vascular disease.  ABDOMEN/PELVIS  No abnormal hypermetabolic activity within the liver, pancreas, adrenal glands, or spleen. No hypermetabolic lymph nodes in the abdomen or pelvis.  Photopenic right mid kidney cyst. Lobularity along the inferior margin of the right kidney probably from cysts, a 0.8 cm lobulation of the right kidney lower pole is hyperdense and technically nonspecific although potentially complex cyst.  Sigmoid colon diverticulosis. Foley catheter in the urinary bladder with seed implants along the prostate gland. Aortoiliac atherosclerotic vascular disease.  SKELETON  No focal hypermetabolic activity to suggest skeletal metastasis.  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 Fusion of both sacroiliac joints.  IMPRESSION: 1. Hypermetabolic mildly enlarged bilateral mediastinal, bilateral hilar, and bilateral infrahilar lymph nodes with maximum SUV in the 11-13 range. The slightly cavitary 1.1 by 0.8 cm right  upper lobe pulmonary nodule has a maximum SUV of 2.8, abnormally hypermetabolic but less striking activity compared to the adenopathy. The more striking pattern of adenopathy compared to the lung nodule, and the relatively symmetric bilaterally of the nodularity raise the possibility that some or all of the process wearing catheter in could be due to atypical infection such as non tuberculous mycobacterial infection (also a potential cause for the centrilobular nodularity), or an unusual presentation of a low-grade lung cancer accompanied by adenopathy due to a different process such as lymphoma. Tissue diagnosis is likely warranted. Clearly sarcoidosis is another process that can cause hypermetabolic lung nodules and hypermetabolic adenopathy; but typically the nodularity in sarcoid tends to be peribronchovascular and along septal surfaces rather than centrilobular. 2. No abnormal hypermetabolic lesions in the abdomen/pelvis or skeleton. 3. Accentuated posterior nasopharyngeal activity with maximum SUV 8.8. Occasionally such activity may simply be physiologic, this may warrant visual inspection for any, and polyp or mass along the posterior nasopharynx. 4. Other imaging findings of potential clinical significance: Chronic right maxillary sinusitis. Aortic Atherosclerosis (ICD10-I70.0). Sigmoid colon diverticulosis. Fused sacroiliac joints. 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 Airway thickening is present, suggesting bronchitis or reactive airways disease.   Electronically Signed   By: Van Clines M.D.   On: 01/24/2017 10:54 I personally reviewed the CT and PET/CT images and concur with the findings noted above  Pulmonary function testing FVC 3.27 (75%) FEV1 2.09 (64%) FEV1 2.27 (70%) postbronchodilator DLCO 19.28 (51%)  Impression: Jerry Wise is a 77 year old man with a past medical history significant for tobacco abuse, stage III chronic  kidney disease, hyperparathyroidism, prostate cancer, arthritis and hypertension.  He has a newly diagnosed squamous cell carcinoma of the right upper lobe.  Right upper lobe nodule-clinical stage IIIB by PET/CT.  Degree of hypermetabolism in the lymph nodes is out of proportion to that and the primary nodule.  Pathologically he appears to be stage IA based on negative lymph node aspirations at the time of endobronchial ultrasound.  I had a long discussion with Jerry Wise and reviewed the CT and PET/CT images with them.  I explained that endobronchial ultrasound is not infallible in terms of diagnosing metastatic disease to the lymph nodes we would want to confirm that the lymph nodes were not involved prior to surgical resection.  If the lymph nodes truly are not involved then surgical resection would offer him the best chance of cure for the right upper lobe squamous cell carcinoma.  I recommended that we proceed with mediastinoscopy, right VATS for right upper lobectomy.  Obviously if the mediastinal lymph nodes were positive for cancer we will not proceed with the  lobectomy.  If they are in fact negative then we would proceed with a lobectomy at the same setting.  He understands this would be done in the hospital under general anesthesia.  We discussed the incisions to be used, the use of a drainage tube postoperatively, the expected hospital stay, and the overall recovery.  I reviewed the indications, risks, benefits, and alternatives with them.  He understands the risk include, but are not limited to death, MI, DVT, PE, stroke, bleeding, possible need for transfusion, infection, prolonged air leak, as well as the possibility of other unforeseeable complications.  He accepts the risks and wishes to proceed.  Tonsillar hyperactivity on PET-no CT correlate.  May be due to coughing.  We will ask ENT to evaluate.  Plan:  ENT evaluation of pharynx  Mediastinoscopy, right VATS, right upper lobectomy  on Thursday, 03/03/2017  [name], MD [facility] [phone]

## 2017-02-15 NOTE — Progress Notes (Signed)
PCP is Josetta Huddle, MD Referring Provider is Collene Gobble, MD  Chief Complaint  Patient presents with  . Lung Lesion    new patient, discuss surgery, PET 01/24/2017, CT Chest 01/31/2017    HPI: Mr. Roback is a 77 year old man with a past medical history significant for tobacco abuse, stage III chronic kidney disease, pernicious anemia, hyper parathyroidism status post parathyroidectomy, prostate cancer treated with radioactive seeds, gouty arthritis, and hypertension.  He began participating in the lung cancer screening program in September 2016.  His CT and September 2018 showed a new 6.8 mm right upper lobe nodule.  A follow-up CT in December showed an increase in size to 8.4 mm.  A PET/CT in January showed some metabolic uptake in the nodule.  There was markedly hypermetabolic bilateral mediastinal, hilar, and infrahilar adenopathy.  Dr. Lamonte Sakai did a navigational bronchoscopy and endobronchial ultrasound.  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  Lymph node aspirations were negative for tumor, but were nondiagnostic.  Mr. Palau says that he is not very physically active.  He is somewhat limited by arthritis.  He watches a lot of TV.  He still smokes one half a pack of cigarettes daily.  He lives with his wife.  He is retired from [employer] work.  He denies any chest pain, pressure, or tightness with exertion.  He denies any change in appetite or weight loss.  He has an occasional cough but denies wheezing and shortness of breath.  Zubrod Score: At the time of surgery this patient's most appropriate activity status/level should be described as: []     0    Normal activity, no symptoms [x]     1    Restricted in physical strenuous activity but ambulatory, able to do out light work []     2    Ambulatory and capable of self care, unable to do work activities, up and about >50 % of waking hours                              []     3    Only limited self care, in bed  greater than 50% of waking hours []     4    Completely disabled, no self care, confined to bed or chair []     5    Moribund  Past Medical History:  Diagnosis Date  . Chronic renal insufficiency    NEPHROLOGIST-  DR Justin Mend -- LAST VISIT MARCH 2014  . GERD (gastroesophageal reflux disease)   . Gouty arthritis    ALL JOINTS  . History of prostate cancer    S/P RADIACTIVE SEED IMPLANTS  . Hydronephrosis, bilateral   . Hypertension   . 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 Pernicious anemia FOLLOWED BY DR WEBB   x3 feraheme injection --- last one 03-22-2012  . Urethral stricture     Past Surgical History:  Procedure Laterality Date  . CLOSED REDUCTION CLAVICAL FX     NO HARDWARE  . COLONOSCOPY    . CYSTO/  BALLOON DILATION OF URETHRAL STRICTURE AND BLADDER BX WITH FULGERATION  09-14-2007  . CYSTOSCOPY W/ URETERAL STENT PLACEMENT N/A 04/06/2012   Procedure: CYSTOSCOPY Balloon DILATION OF STRICTURE, Fulgeration Bladder Neck, Cystogram;  Surgeon: Malka So, MD;  Location: [facility];  Service: Urology;  Laterality: N/A;  .  KNEE SURGERY    . MULTIPLE TOOTH EXTRACTIONS    . PARATHYROIDECTOMY Right 04/28/2012   Procedure:  RIGHT INFERIOR PARATHYROIDECTOMY;  Surgeon: Earnstine Regal, MD;  Location: [facility];  Service: General;  Laterality: Right;  . RADIOACTIVE PROSTATE SEED IMPLANTS  2000   prostate cancer  . VIDEO BRONCHOSCOPY WITH ENDOBRONCHIAL NAVIGATION N/A 02/02/2017   Procedure: VIDEO BRONCHOSCOPY WITH ENDOBRONCHIAL NAVIGATION  with biopsies  and Fiducial Placement;  Surgeon: Collene Gobble, MD;  Location: [facility];  Service: Thoracic;  Laterality: N/A;  . VIDEO BRONCHOSCOPY WITH ENDOBRONCHIAL ULTRASOUND N/A 02/02/2017   Procedure: VIDEO BRONCHOSCOPY WITH ENDOBRONCHIAL ULTRASOUND  with node biopsies;  Surgeon: Collene Gobble, MD;  Location: [facility];  Service: Thoracic;  Laterality: N/A;     History reviewed. No pertinent family history.  Social History Social History   Tobacco Use  . Smoking status: Current Every Day Smoker    Packs/day: 0.50    Years: 55.00    Pack years: 27.50    Types: Cigarettes  . Smokeless tobacco: Never Used  Substance Use Topics  . Alcohol use: Yes    Alcohol/week: 8.4 oz    Types: 14 Standard drinks or equivalent per week    Comment: 3 SHOT DAILY  . Drug use: No    Current Outpatient Medications  Medication Sig Dispense Refill  . acetaminophen (TYLENOL) 500 MG tablet Take 1,000 mg by mouth every 8 (eight) hours as needed for mild pain or headache.    . allopurinol (ZYLOPRIM) 300 MG tablet Take 300 mg by mouth every morning.     Marland Kitchen amLODipine (NORVASC) 10 MG tablet Take 10 mg by mouth every morning.     Marland Kitchen aspirin EC 81 MG tablet Take 81 mg by mouth daily.    Marland Kitchen atenolol (TENORMIN) 100 MG tablet Take 100 mg by mouth every morning.     . Cholecalciferol 2000 units CAPS Take 2,000 Units by mouth daily.    . hydrochlorothiazide (MICROZIDE) 12.5 MG capsule Take 12.5 mg by mouth daily.     No current facility-administered medications for this visit.     No Known Allergies  Review of Systems  Constitutional: Negative for activity change, appetite change and unexpected weight change.  HENT: Positive for dental problem. Negative for trouble swallowing and voice change.   Eyes: Negative for visual disturbance.  Respiratory: Positive for cough. Negative for shortness of breath and wheezing.   Cardiovascular: Negative for chest pain and leg swelling.  Gastrointestinal: Negative for abdominal distention and abdominal pain.  Genitourinary: Positive for frequency. Negative for dysuria.  Musculoskeletal: Positive for arthralgias, back pain and joint swelling.  Neurological: Negative for seizures, syncope and headaches.  Hematological: Negative for adenopathy. Does not bruise/bleed easily.  All other systems reviewed and are negative.   BP  (!) 150/86   Pulse 70   Resp 20   Ht 6\' 2"  (1.88 m)   Wt 175 lb (79.4 kg)   SpO2 98% Comment: RA  BMI 22.47 kg/m  Physical Exam  Constitutional: He is oriented to person, place, and time. He appears well-developed and well-nourished. No distress.  HENT:  Head: Normocephalic and atraumatic.  Eyes: Conjunctivae and EOM are normal. No scleral icterus.  Neck: Neck supple. No thyromegaly present.  Well-healed lower cervical incision  Cardiovascular: Normal rate, regular rhythm and normal heart sounds. Exam reveals no gallop and no friction rub.  No murmur heard. Pulmonary/Chest: Effort normal and breath sounds normal. No respiratory distress. He has no wheezes.  He has no rales.  Abdominal: Soft. He exhibits no distension. There is no tenderness.  Musculoskeletal: He exhibits no edema.  Lymphadenopathy:    He has no cervical adenopathy.  Neurological: He is alert and oriented to person, place, and time. No cranial nerve deficit. He exhibits normal muscle tone.  Skin: Skin is warm and dry.  Vitals reviewed.    Diagnostic Tests: CT CHEST WITHOUT CONTRAST  TECHNIQUE: Multidetector CT imaging of the chest was performed using thin slice collimation for electromagnetic bronchoscopy planning purposes, without intravenous contrast.  COMPARISON:  01/24/2017 PET-CT.  12/30/2016 chest CT.  FINDINGS: Cardiovascular: Normal heart size. No significant pericardial fluid/thickening. Left main and 3 vessel coronary atherosclerosis. Atherosclerotic nonaneurysmal thoracic aorta. Normal caliber pulmonary arteries.  Mediastinum/Nodes: No discrete thyroid nodules. Surgical clips are again noted at the lower margin of the right thyroid lobe. Unremarkable esophagus. No axillary adenopathy. Mildly enlarged AP window 1.0 cm node (series 2/image 62). Mildly enlarged 1.1 cm right paratracheal node (series 2/image 61). Mildly enlarged 1.3 cm right infrahilar node (series 2/image 89) and mildly  enlarged 1.0 cm left infrahilar node (series 2/image 80). These nodes were all hypermetabolic on the recent PET-CT.  Lungs/Pleura: No pneumothorax. No pleural effusion. Mild centrilobular emphysema with mild diffuse bronchial wall thickening. No acute consolidative airspace disease or lung masses. There is a dominant partially cavitary 1.0 cm right upper lobe pulmonary nodule (series 3/image 70), not appreciably changed since 12/30/2016 chest CT. There is extensive patchy ground-glass and solid micronodularity in a centrilobular distribution throughout both lungs, predominantly in the upper lobes, not appreciably changed.  Upper abdomen: No acute abnormality.  Musculoskeletal: No aggressive appearing focal osseous lesions. Moderate thoracic spondylosis.  IMPRESSION: 1. Nonspecific mild bilateral mediastinal and bilateral infrahilar adenopathy, not appreciably changed from 01/24/2017 PET-CT, where these nodes were seen to be hypermetabolic. Metastatic nodal disease cannot be excluded. 2. Dominant partially cavitary 1.0 cm right upper lobe pulmonary nodule, not appreciably changed since 12/30/2016 chest CT, which was mildly hypermetabolic on recent PET-CT, cannot exclude primary bronchogenic carcinoma. 3. Background centrilobular micronodularity in both lungs, upper lobe predominant, most suggestive of respiratory bronchiolitis. 4. Left main and 3 vessel coronary atherosclerosis.  Aortic Atherosclerosis (ICD10-I70.0) and Emphysema (ICD10-J43.9).   Electronically Signed   By: Ilona Sorrel M.D.   On: 01/31/2017 12:32 NUCLEAR MEDICINE PET SKULL BASE TO THIGH  TECHNIQUE: 8.6 mCi F-18 FDG was injected intravenously. Full-ring PET imaging was performed from the skull base to thigh after the radiotracer. CT data was obtained and used for attenuation correction and anatomic localization.  FASTING BLOOD GLUCOSE:  Value: 88 mg/dl  COMPARISON:  CT chest  12/30/2016  FINDINGS: NECK  Accentuated posterior nasopharyngeal activity with maximum SUV 8.8.  Chronic right maxillary sinusitis.  CHEST  The right upper lobe nodule measures 1.1 by 0.8 cm on image 29/8 and has a maximum SUV of 2.8.  Hypermetabolic AP window, bilateral hilar, and bilateral infrahilar adenopathy noted. An AP window lymph node measures 0.9 cm in short axis on image 70/4 with maximum SUV 11.4. A right hilar lymph node has maximum SUV 11.6 and accentuated metabolic activity measuring 1.2 cm in short axis. Index right infrahilar lymph node maximum SUV 12.7, with hypermetabolic activity measuring 1.5 cm in short axis.  Scattered centrilobular nodularity in addition to several small nodules in the 3-4 mm range in the upper lobes. These are not appreciably hypermetabolic. Bilateral airway thickening is present but does not appear nodular.  Coronary, aortic arch, and branch vessel  atherosclerotic vascular disease.  ABDOMEN/PELVIS  No abnormal hypermetabolic activity within the liver, pancreas, adrenal glands, or spleen. No hypermetabolic lymph nodes in the abdomen or pelvis.  Photopenic right mid kidney cyst. Lobularity along the inferior margin of the right kidney probably from cysts, a 0.8 cm lobulation of the right kidney lower pole is hyperdense and technically nonspecific although potentially complex cyst.  Sigmoid colon diverticulosis. Foley catheter in the urinary bladder with seed implants along the prostate gland. Aortoiliac atherosclerotic vascular disease.  SKELETON  No focal hypermetabolic activity to suggest skeletal metastasis.  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 Fusion of both sacroiliac joints.  IMPRESSION: 1. Hypermetabolic mildly enlarged bilateral mediastinal, bilateral hilar, and bilateral infrahilar lymph nodes with maximum SUV in the 11-13 range. The slightly cavitary 1.1 by 0.8 cm right  upper lobe pulmonary nodule has a maximum SUV of 2.8, abnormally hypermetabolic but less striking activity compared to the adenopathy. The more striking pattern of adenopathy compared to the lung nodule, and the relatively symmetric bilaterally of the nodularity raise the possibility that some or all of the process wearing catheter in could be due to atypical infection such as non tuberculous mycobacterial infection (also a potential cause for the centrilobular nodularity), or an unusual presentation of a low-grade lung cancer accompanied by adenopathy due to a different process such as lymphoma. Tissue diagnosis is likely warranted. Clearly sarcoidosis is another process that can cause hypermetabolic lung nodules and hypermetabolic adenopathy; but typically the nodularity in sarcoid tends to be peribronchovascular and along septal surfaces rather than centrilobular. 2. No abnormal hypermetabolic lesions in the abdomen/pelvis or skeleton. 3. Accentuated posterior nasopharyngeal activity with maximum SUV 8.8. Occasionally such activity may simply be physiologic, this may warrant visual inspection for any, and polyp or mass along the posterior nasopharynx. 4. Other imaging findings of potential clinical significance: Chronic right maxillary sinusitis. Aortic Atherosclerosis (ICD10-I70.0). Sigmoid colon diverticulosis. Fused sacroiliac joints. 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 Airway thickening is present, suggesting bronchitis or reactive airways disease.   Electronically Signed   By: Van Clines M.D.   On: 01/24/2017 10:54 I personally reviewed the CT and PET/CT images and concur with the findings noted above  Pulmonary function testing FVC 3.27 (75%) FEV1 2.09 (64%) FEV1 2.27 (70%) postbronchodilator DLCO 19.28 (51%)  Impression: Mr. Delapena is a 77 year old man with a past medical history significant for tobacco abuse, stage III chronic  kidney disease, hyperparathyroidism, prostate cancer, arthritis and hypertension.  He has a newly diagnosed squamous cell carcinoma of the right upper lobe.  Right upper lobe nodule-clinical stage IIIB by PET/CT.  Degree of hypermetabolism in the lymph nodes is out of proportion to that and the primary nodule.  Pathologically he appears to be stage IA based on negative lymph node aspirations at the time of endobronchial ultrasound.  I had a long discussion with Mr. Mrs. Justice and reviewed the CT and PET/CT images with them.  I explained that endobronchial ultrasound is not infallible in terms of diagnosing metastatic disease to the lymph nodes we would want to confirm that the lymph nodes were not involved prior to surgical resection.  If the lymph nodes truly are not involved then surgical resection would offer him the best chance of cure for the right upper lobe squamous cell carcinoma.  I recommended that we proceed with mediastinoscopy, right VATS for right upper lobectomy.  Obviously if the mediastinal lymph nodes were positive for cancer we will not proceed with the  lobectomy.  If they are in fact negative then we would proceed with a lobectomy at the same setting.  He understands this would be done in the hospital under general anesthesia.  We discussed the incisions to be used, the use of a drainage tube postoperatively, the expected hospital stay, and the overall recovery.  I reviewed the indications, risks, benefits, and alternatives with them.  He understands the risk include, but are not limited to death, MI, DVT, PE, stroke, bleeding, possible need for transfusion, infection, prolonged air leak, as well as the possibility of other unforeseeable complications.  He accepts the risks and wishes to proceed.  Tonsillar hyperactivity on PET-no CT correlate.  May be due to coughing.  We will ask ENT to evaluate.  Plan:  ENT evaluation of pharynx  Mediastinoscopy, right VATS, right upper lobectomy  on Thursday, 03/03/2017  [name], MD [facility] [phone]

## 2017-02-17 ENCOUNTER — Encounter: Payer: Self-pay | Admitting: Radiation Oncology

## 2017-02-17 ENCOUNTER — Ambulatory Visit
Admission: RE | Admit: 2017-02-17 | Discharge: 2017-02-17 | Disposition: A | Discharge status: Home / Self Care | Payer: Medicare Other | Source: Ambulatory Visit | Attending: Radiation Oncology | Admitting: Radiation Oncology

## 2017-02-17 ENCOUNTER — Inpatient Hospital Stay: Admission: RE | Admit: 2017-02-17 | Payer: Self-pay | Source: Ambulatory Visit | Admitting: Radiation Oncology

## 2017-02-17 VITALS — BP 141/77 | HR 75 | Temp 98.8°F | Ht 74.0 in | Wt 175.4 lb

## 2017-02-17 DIAGNOSIS — I517 Cardiomegaly: Secondary | ICD-10-CM | POA: Diagnosis not present

## 2017-02-17 DIAGNOSIS — F17210 Nicotine dependence, cigarettes, uncomplicated: Secondary | ICD-10-CM | POA: Insufficient documentation

## 2017-02-17 DIAGNOSIS — I700 Atherosclerosis of aorta: Secondary | ICD-10-CM | POA: Diagnosis not present

## 2017-02-17 DIAGNOSIS — N189 Chronic kidney disease, unspecified: Secondary | ICD-10-CM | POA: Diagnosis not present

## 2017-02-17 DIAGNOSIS — D510 Vitamin B12 deficiency anemia due to intrinsic factor deficiency: Secondary | ICD-10-CM | POA: Insufficient documentation

## 2017-02-17 DIAGNOSIS — K5730 Diverticulosis of large intestine without perforation or abscess without bleeding: Secondary | ICD-10-CM | POA: Diagnosis not present

## 2017-02-17 DIAGNOSIS — Z923 Personal history of irradiation: Secondary | ICD-10-CM | POA: Diagnosis not present

## 2017-02-17 DIAGNOSIS — Z7982 Long term (current) use of aspirin: Secondary | ICD-10-CM | POA: Diagnosis not present

## 2017-02-17 DIAGNOSIS — R918 Other nonspecific abnormal finding of lung field: Secondary | ICD-10-CM | POA: Diagnosis not present

## 2017-02-17 DIAGNOSIS — N35919 Unspecified urethral stricture, male, unspecified site: Secondary | ICD-10-CM | POA: Diagnosis not present

## 2017-02-17 DIAGNOSIS — J320 Chronic maxillary sinusitis: Secondary | ICD-10-CM | POA: Insufficient documentation

## 2017-02-17 DIAGNOSIS — K219 Gastro-esophageal reflux disease without esophagitis: Secondary | ICD-10-CM | POA: Diagnosis not present

## 2017-02-17 DIAGNOSIS — C3411 Malignant neoplasm of upper lobe, right bronchus or lung: Secondary | ICD-10-CM | POA: Diagnosis present

## 2017-02-17 DIAGNOSIS — C61 Malignant neoplasm of prostate: Secondary | ICD-10-CM | POA: Insufficient documentation

## 2017-02-17 DIAGNOSIS — I129 Hypertensive chronic kidney disease with stage 1 through stage 4 chronic kidney disease, or unspecified chronic kidney disease: Secondary | ICD-10-CM | POA: Insufficient documentation

## 2017-02-17 DIAGNOSIS — Z79899 Other long term (current) drug therapy: Secondary | ICD-10-CM | POA: Diagnosis not present

## 2017-02-17 DIAGNOSIS — N281 Cyst of kidney, acquired: Secondary | ICD-10-CM | POA: Insufficient documentation

## 2017-02-17 DIAGNOSIS — R599 Enlarged lymph nodes, unspecified: Secondary | ICD-10-CM | POA: Insufficient documentation

## 2017-02-17 NOTE — Progress Notes (Signed)
Radiation Oncology         [phone] ________________________________  Name: Jerry Wise        MRN: [id number]  Date of Service: 02/17/2017 DOB: 1940/10/28  EX:BMWUX, Herbie [place], MD  Collene Gobble, MD     REFERRING PHYSICIAN: Collene Gobble, MD   DIAGNOSIS: The primary encounter diagnosis was Prostate cancer [facility]). A diagnosis of Pulmonary nodule was also pertinent to this visit.   HISTORY OF PRESENT ILLNESS: Jerry Wise is a 77 y.o. male seen at the request of Dr. Roxan Hockey for a new diagnosis of non small cell lung cancer. The patient was being evaluated in the lung cancer screening clinic and was found to have a mass in the RUL of the lung measuring 6.8 mm in September 2018, repeat imaging on 12/30/16 measured this at 8.4 mm. He underwent PET scan on 01/24/17, and this revealed a hypermetabolic lesion measuring 1.1 x 0.8 cm with an SUV of 2.7 and bilateral hilar, mediastinal and infrahilar adenopathy with an SUV of 12.7.  He underwent biopsy of the primary lesion as well as an 11 R node and a 4 mL node.  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  He has been seen and evaluated as well for brain MRI scan which was also negative for disease.  He is met with Dr. Roxan Hockey earlier this week, and has plans to undergo mediastinoscopy with possible right upper lobectomy.  He comes today to discuss options of radiotherapy if he was found to have nodal involvement.    PREVIOUS RADIATION THERAPY: Yes   02/26/98-04/01/1998, and 05/02/1998: Patient was treated to 45 Gy to the prostate with 25 fractions of external beam radiotherapy followed by placement of 110 iodine seeds within the prostate to total 72 Gy, and his overall summary dose was 125 Gy treated by Dr. Sondra Come   PAST MEDICAL HISTORY:  Past Medical  History:  Diagnosis Date  . Chronic renal insufficiency    NEPHROLOGIST-  DR Justin Mend -- LAST VISIT MARCH 2014  . GERD (gastroesophageal reflux disease)   . Gouty arthritis    ALL JOINTS  . History of prostate cancer    S/P RADIACTIVE SEED IMPLANTS  . Hydronephrosis, bilateral   . Hypertension   . 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 Pernicious anemia FOLLOWED BY DR WEBB   x3 feraheme injection --- last one 03-22-2012  . Urethral stricture        PAST SURGICAL HISTORY: Past Surgical History:  Procedure Laterality Date  . CLOSED REDUCTION CLAVICAL FX     NO HARDWARE  . COLONOSCOPY    . CYSTO/  BALLOON DILATION OF URETHRAL STRICTURE AND BLADDER BX WITH FULGERATION  09-14-2007  . CYSTOSCOPY W/ URETERAL STENT PLACEMENT N/A 04/06/2012   Procedure: CYSTOSCOPY Balloon DILATION OF STRICTURE, Fulgeration Bladder Neck, Cystogram;  Surgeon: Malka So, MD;  Location: [facility];  Service: Urology;  Laterality: N/A;  . KNEE SURGERY    . MULTIPLE TOOTH EXTRACTIONS    . PARATHYROIDECTOMY Right 04/28/2012   Procedure:  RIGHT INFERIOR PARATHYROIDECTOMY;  Surgeon: Earnstine Regal, MD;  Location: [facility];  Service: General;  Laterality: Right;  . RADIOACTIVE PROSTATE SEED IMPLANTS  2000   prostate  cancer  . VIDEO BRONCHOSCOPY WITH ENDOBRONCHIAL NAVIGATION N/A 02/02/2017   Procedure: VIDEO BRONCHOSCOPY WITH ENDOBRONCHIAL NAVIGATION  with biopsies  and Fiducial Placement;  Surgeon: Collene Gobble, MD;  Location: [place];  Service: Thoracic;  Laterality: N/A;  . VIDEO BRONCHOSCOPY WITH ENDOBRONCHIAL ULTRASOUND N/A 02/02/2017   Procedure: VIDEO BRONCHOSCOPY WITH ENDOBRONCHIAL ULTRASOUND  with node biopsies;  Surgeon: Collene Gobble, MD;  Location: [place];  Service: Thoracic;  Laterality: N/A;     FAMILY HISTORY: No family history on file.   SOCIAL HISTORY:  reports that he has been smoking cigarettes.  He has  a 27.50 pack-year smoking history. he has never used smokeless tobacco. He reports that he drinks about 8.4 oz of alcohol per week. He reports that he does not use drugs.  The patient is married and resides in [place].  ALLERGIES: Patient has no known allergies.   MEDICATIONS:  Current Outpatient Medications  Medication Sig Dispense Refill  . acetaminophen (TYLENOL) 500 MG tablet Take 1,000 mg by mouth every 8 (eight) hours as needed for mild pain or headache.    . allopurinol (ZYLOPRIM) 300 MG tablet Take 300 mg by mouth every morning.     Marland Kitchen amLODipine (NORVASC) 10 MG tablet Take 10 mg by mouth every morning.     Marland Kitchen aspirin EC 81 MG tablet Take 81 mg by mouth daily.    Marland Kitchen atenolol (TENORMIN) 100 MG tablet Take 100 mg by mouth every morning.     . Cholecalciferol 2000 units CAPS Take 2,000 Units by mouth daily.    . hydrochlorothiazide (MICROZIDE) 12.5 MG capsule Take 12.5 mg by mouth daily.     No current facility-administered medications for this encounter.      REVIEW OF SYSTEMS: On review of systems, the patient reports that he is doing well overall. He denies any chest pain, shortness of breath, cough, fevers, chills, night sweats, unintended weight changes. He denies any bowel or bladder disturbances, and denies abdominal pain, nausea or vomiting. He denies any new musculoskeletal or joint aches or pains. A complete review of systems is obtained and is otherwise negative.     PHYSICAL EXAM:  Wt Readings from Last 3 Encounters:  02/17/17 175 lb 6.4 oz (79.6 kg)  02/15/17 175 lb (79.4 kg)  02/02/17 175 lb (79.4 kg)   Temp Readings from Last 3 Encounters:  02/17/17 98.8 F (37.1 C)  02/02/17 97.8 F (36.6 C)  05/15/12 (!) 96.8 F (36 C) (Temporal)   BP Readings from Last 3 Encounters:  02/17/17 (!) 141/77  02/15/17 (!) 150/86  02/02/17 (!) 159/88   Pulse Readings from Last 3 Encounters:  02/17/17 75  02/15/17 70  02/02/17 72   Pain Assessment Pain Score: 0-No  pain/10  In general this is a well appearing African American male in no acute distress. He is alert and oriented x4 and appropriate throughout the examination. Cardiopulmonary assessment is negative for acute distress and he exhibits normal effort.   ECOG = 0  0 - Asymptomatic (Fully active, able to carry on all predisease activities without restriction)  1 - Symptomatic but completely ambulatory (Restricted in physically strenuous activity but ambulatory and able to carry out work of a light or sedentary nature. For example, light housework, office work)  2 - Symptomatic, <50% in bed during the day (Ambulatory and capable of all self care but unable to carry out any work activities. Up and about more than 50% of waking hours)  3 - Symptomatic, >  50% in bed, but not bedbound (Capable of only limited self-care, confined to bed or chair 50% or more of waking hours)  4 - Bedbound (Completely disabled. Cannot carry on any self-care. Totally confined to bed or chair)  5 - Death   Eustace Pen MM, Creech RH, Tormey DC, et al. [phone]). "Toxicity and response criteria of the [facility]". [place] Oncol. 5 (6): 649-55    LABORATORY DATA:  Lab Results  Component Value Date   WBC 6.2 02/02/2017   HGB 16.5 02/02/2017   HCT 46.8 02/02/2017   MCV 91.8 02/02/2017   PLT 310 02/02/2017   Lab Results  Component Value Date   NA 137 02/02/2017   K 3.2 (L) 02/02/2017   CL 105 02/02/2017   CO2 22 02/02/2017   Lab Results  Component Value Date   ALT 19 02/02/2017   AST 27 02/02/2017   ALKPHOS 95 02/02/2017   BILITOT 1.0 02/02/2017      RADIOGRAPHY: Mr Jeri Cos OY Contrast  Result Date: 02/13/2017 CLINICAL DATA:  77 y/o M; new diagnosis of lung cancer in January, MRI for staging. EXAM: MRI HEAD WITHOUT AND WITH CONTRAST TECHNIQUE: Multiplanar, multiecho pulse sequences of the brain and surrounding structures were obtained without and with intravenous contrast. CONTRAST:  15  cc MultiHance. COMPARISON:  None. FINDINGS: Brain: No acute infarction, hemorrhage, hydrocephalus, extra-axial collection or mass lesion. Fewnonspecific foci of T2 FLAIR hyperintense signal abnormality in subcortical and periventricular white matter are compatible withmildchronic microvascular ischemic changes for age. Mildbrain parenchymal volume loss. No abnormal enhancement. Vascular: Normal flow voids. Skull and upper cervical spine: Normal marrow signal. Sinuses/Orbits: Right maxillary sinus extensive mucosal thickening. No abnormal signal of mastoid air cells. Orbits are unremarkable. Other: None. IMPRESSION: 1. No metastatic disease of the head identified. 2. Mild chronic microvascular ischemic changes and mild parenchymal volume loss of the brain. 3. Right maxillary sinus extensive mucosal thickening. Electronically Signed   By: Kristine Garbe M.D.   On: 02/13/2017 04:05   Nm Pet Image Initial (pi) Skull Base To Thigh  Result Date: 01/24/2017 CLINICAL DATA:  Initial treatment strategy for right upper lobe lung nodule. EXAM: NUCLEAR MEDICINE PET SKULL BASE TO THIGH TECHNIQUE: 8.6 mCi F-18 FDG was injected intravenously. Full-ring PET imaging was performed from the skull base to thigh after the radiotracer. CT data was obtained and used for attenuation correction and anatomic localization. FASTING BLOOD GLUCOSE:  Value: 88 mg/dl COMPARISON:  CT chest 12/30/2016 FINDINGS: NECK Accentuated posterior nasopharyngeal activity with maximum SUV 8.8. Chronic right maxillary sinusitis. CHEST The right upper lobe nodule measures 1.1 by 0.8 cm on image 29/8 and has a maximum SUV of 2.8. Hypermetabolic AP window, bilateral hilar, and bilateral infrahilar adenopathy noted. An AP window lymph node measures 0.9 cm in short axis on image 70/4 with maximum SUV 11.4. A right hilar lymph node has maximum SUV 11.6 and accentuated metabolic activity measuring 1.2 cm in short axis. Index right infrahilar lymph node  maximum SUV 12.7, with hypermetabolic activity measuring 1.5 cm in short axis. Scattered centrilobular nodularity in addition to several small nodules in the 3-4 mm range in the upper lobes. These are not appreciably hypermetabolic. Bilateral airway thickening is present but does not appear nodular. Coronary, aortic arch, and branch vessel atherosclerotic vascular disease. ABDOMEN/PELVIS No abnormal hypermetabolic activity within the liver, pancreas, adrenal glands, or spleen. No hypermetabolic lymph nodes in the abdomen or pelvis. Photopenic right mid kidney cyst. Lobularity along the inferior  margin of the right kidney probably from cysts, a 0.8 cm lobulation of the right kidney lower pole is hyperdense and technically nonspecific although potentially complex cyst. Sigmoid colon diverticulosis. Foley catheter in the urinary bladder with seed implants along the prostate gland. Aortoiliac atherosclerotic vascular disease. SKELETON No focal hypermetabolic activity to suggest skeletal metastasis. 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 Fusion of both sacroiliac joints. IMPRESSION: 1. Hypermetabolic mildly enlarged bilateral mediastinal, bilateral hilar, and bilateral infrahilar lymph nodes with maximum SUV in the 11-13 range. The slightly cavitary 1.1 by 0.8 cm right upper lobe pulmonary nodule has a maximum SUV of 2.8, abnormally hypermetabolic but less striking activity compared to the adenopathy. The more striking pattern of adenopathy compared to the lung nodule, and the relatively symmetric bilaterally of the nodularity raise the possibility that some or all of the process wearing catheter in could be due to atypical infection such as non tuberculous mycobacterial infection (also a potential cause for the centrilobular nodularity), or an unusual presentation of a low-grade lung cancer accompanied by adenopathy due to a different process such as lymphoma. Tissue diagnosis is likely  warranted. Clearly sarcoidosis is another process that can cause hypermetabolic lung nodules and hypermetabolic adenopathy; but typically the nodularity in sarcoid tends to be peribronchovascular and along septal surfaces rather than centrilobular. 2. No abnormal hypermetabolic lesions in the abdomen/pelvis or skeleton. 3. Accentuated posterior nasopharyngeal activity with maximum SUV 8.8. Occasionally such activity may simply be physiologic, this may warrant visual inspection for any, and polyp or mass along the posterior nasopharynx. 4. Other imaging findings of potential clinical significance: Chronic right maxillary sinusitis. Aortic Atherosclerosis (ICD10-I70.0). Sigmoid colon diverticulosis. Fused sacroiliac joints. 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 Airway thickening is present, suggesting bronchitis or reactive airways disease. Electronically Signed   By: Van Clines M.D.   On: 01/24/2017 10:54   Dg Chest Port 1 View  Result Date: 02/02/2017 CLINICAL DATA:  Followup bronchoscopy and right upper lobe biopsy. EXAM: PORTABLE CHEST 1 VIEW COMPARISON:  CT 01/31/2017 FINDINGS: Chronic cardiomegaly and aortic atherosclerosis. Marker clips in the right lung in the region of biopsy. One radiodense entity is present in the right lower lobe, but this was present preprocedure on the CT scan. Very mild hazy opacity in the region consistent with a small amount of alveolar hemorrhage. No pronounced density. No collapse. No effusion or pneumothorax. Mild chronic scarring at the left base. IMPRESSION: No pneumothorax on the right following biopsy. No dense alveolar filling or collapse. Hazy density in the region of the biopsy consistent with a minor degree of alveolar hemorrhage. Three markers left in place. One marker present within the right lower lobe was present preprocedure. Electronically Signed   By: Nelson Chimes M.D.   On: 02/02/2017 14:16   Ct Super D Chest Wo  Contrast  Result Date: 01/31/2017 CLINICAL DATA:  Enlarging screening detected right upper lobe pulmonary nodule, which was mildly hypermetabolic on subsequent PET-CT. Additional prominently hypermetabolic mediastinal and bilateral hilar nodes on recent PET-CT. Pre-bronchoscopic evaluation. EXAM: CT CHEST WITHOUT CONTRAST TECHNIQUE: Multidetector CT imaging of the chest was performed using thin slice collimation for electromagnetic bronchoscopy planning purposes, without intravenous contrast. COMPARISON:  01/24/2017 PET-CT.  12/30/2016 chest CT. FINDINGS: Cardiovascular: Normal heart size. No significant pericardial fluid/thickening. Left main and 3 vessel coronary atherosclerosis. Atherosclerotic nonaneurysmal thoracic aorta. Normal caliber pulmonary arteries. Mediastinum/Nodes: No discrete thyroid nodules. Surgical clips are again noted at the lower margin of the right thyroid lobe. Unremarkable  esophagus. No axillary adenopathy. Mildly enlarged AP window 1.0 cm node (series 2/image 62). Mildly enlarged 1.1 cm right paratracheal node (series 2/image 61). Mildly enlarged 1.3 cm right infrahilar node (series 2/image 89) and mildly enlarged 1.0 cm left infrahilar node (series 2/image 80). These nodes were all hypermetabolic on the recent PET-CT. Lungs/Pleura: No pneumothorax. No pleural effusion. Mild centrilobular emphysema with mild diffuse bronchial wall thickening. No acute consolidative airspace disease or lung masses. There is a dominant partially cavitary 1.0 cm right upper lobe pulmonary nodule (series 3/image 70), not appreciably changed since 12/30/2016 chest CT. There is extensive patchy ground-glass and solid micronodularity in a centrilobular distribution throughout both lungs, predominantly in the upper lobes, not appreciably changed. Upper abdomen: No acute abnormality. Musculoskeletal: No aggressive appearing focal osseous lesions. Moderate thoracic spondylosis. IMPRESSION: 1. Nonspecific mild  bilateral mediastinal and bilateral infrahilar adenopathy, not appreciably changed from 01/24/2017 PET-CT, where these nodes were seen to be hypermetabolic. Metastatic nodal disease cannot be excluded. 2. Dominant partially cavitary 1.0 cm right upper lobe pulmonary nodule, not appreciably changed since 12/30/2016 chest CT, which was mildly hypermetabolic on recent PET-CT, cannot exclude primary bronchogenic carcinoma. 3. Background centrilobular micronodularity in both lungs, upper lobe predominant, most suggestive of respiratory bronchiolitis. 4. Left main and 3 vessel coronary atherosclerosis. Aortic Atherosclerosis (ICD10-I70.0) and Emphysema (ICD10-J43.9). Electronically Signed   By: Ilona Sorrel M.D.   On: 01/31/2017 12:32   Dg C-arm Bronchoscopy  Result Date: 02/02/2017 C-ARM BRONCHOSCOPY: Fluoroscopy was utilized by the requesting physician.  No radiographic interpretation.       IMPRESSION/PLAN: 1. At least Stage IA2, [id number]. Dr. Lisbeth Renshaw discusses the pathology findings and reviews the nature of early lung disease.  Although the patient's biopsies from his lymph nodes on navigational bronchoscopy were negative, they were limited by the fact that there was low volume of lymphatic tissue.  The patient will be undergoing surgical excision and we do not anticipate a role for radiotherapy currently. However if he was found to have stage III disease, we would offer concurrent ChemoRT. We discussed the risks, benefits, short, and long term effects of radiotherapy, and we will follow up with the results from his surgery which is scheduled for 03/03/17.  In a visit lasting 60 minutes, greater than 50% of the time was spent face to face discussing his work up [employer], and coordinating the patient's care.  The above documentation reflects my direct findings during this shared patient visit. Please see the separate note by Dr. Lisbeth Renshaw on this date for the remainder of the patient's plan of care.    Carola Rhine, PAC

## 2017-02-28 NOTE — Pre-Procedure Instructions (Signed)
Jerry Wise  02/28/2017      [id number] [address], [place] [address] Phone: [phone] Fax: [phone]    Your procedure is scheduled on 03-03-2017 Thursday   Report to [facility] Admitting at 6:00 A.M.   Call this number if you have problems the morning of surgery:  [phone]   Remember:  Do not eat food or drink liquids after midnight.   Take these medicines the morning of surgery with A SIP OF WATER  Tylenol if needed Allopurinol(Zyloprim) Amlodipine(Norvasc) Atenolol(Tenormin)   STOP TAKING ANY ASPIRIN(UNLESS OTHERWISE INSTRUCTED BY YOUR SURGEON),ANTIINFLAMATORIES (IBUPROFEN,ALEVE,MOTRIN,ADVIL,GOODY'S POWDERS),HERBAL SUPPLEMENTS,FISH OIL,AND VITAMINS 5-7 DAYS PRIOR TO SURGERY   Do not wear jewelry,  Do not wear lotions, powders, or perfumes, or deodorant.  Do not shave 48 hours prior to surgery.  Men may shave face and neck.   Do not bring valuables to the hospital.   [facility] is not responsible for any belongings or valuables.  Contacts, dentures or bridgework may not be worn into surgery.  Leave your suitcase in the car.  After surgery it may be brought to your room.  For patients admitted to the hospital, discharge time will be determined by your treatment team.  Patients discharged the day of surgery will not be allowed to drive home    Special Instructions: [facility] - Preparing for Surgery  Before surgery, you can play an important role.  Because skin is not sterile, your skin needs to be as free of germs as possible.  You can reduce the number of germs on you skin by washing with CHG (chlorahexidine gluconate) soap before surgery.  CHG is an antiseptic cleaner which kills germs and bonds with the skin to continue killing germs even after washing.  Please DO NOT use if you have an allergy to CHG or antibacterial soaps.  If your skin becomes reddened/irritated stop  using the CHG and inform your nurse when you arrive at Short Stay.  Do not shave (including legs and underarms) for at least 48 hours prior to the first CHG shower.  You may shave your face.  Please follow these instructions carefully:   1.  Shower with CHG Soap the night before surgery and the   morning of Surgery.  2.  If you choose to wash your hair, wash your hair first as usual with your normal shampoo.  3.  After you shampoo, rinse your hair and body thoroughly to remove the  Shampoo.  4.  Use CHG as you would any other liquid soap.  You can apply chg directly  to the skin and wash gently with scrungie or a clean washcloth.  5.  Apply the CHG Soap to your body ONLY FROM THE NECK DOWN.   Do not use on open wounds or open sores.  Avoid contact with your eyes,  ears, mouth and genitals (private parts).  Wash genitals (private parts) with your normal soap.  6.  Wash thoroughly, paying special attention to the area where your surgery will be performed.  7.  Thoroughly rinse your body with warm water from the neck down.  8.  DO NOT shower/wash with your normal soap after using and rinsing o  the CHG Soap.  9.  Pat yourself dry with a clean towel.            10.  Wear clean pajamas.  11.  Place clean sheets on your bed the night of your first shower and do not sleep with pets.  Day of Surgery  Do not apply any lotions/deodorants the morning of surgery.  Please wear clean clothes to the hospital/surgery center.   Please read over the following fact sheets that you were given. Pain Booklet, Coughing and Deep Breathing, MRSA Information and Surgical Site Infection Prevention ,Incentive Spirometry

## 2017-03-01 ENCOUNTER — Ambulatory Visit (HOSPITAL_COMMUNITY)
Admission: RE | Admit: 2017-03-01 | Discharge: 2017-03-01 | Disposition: A | Discharge status: Home / Self Care | Payer: Medicare Other | Source: Ambulatory Visit | Attending: Thoracic Surgery (Cardiothoracic Vascular Surgery) | Admitting: Thoracic Surgery (Cardiothoracic Vascular Surgery)

## 2017-03-01 ENCOUNTER — Encounter (HOSPITAL_COMMUNITY)
Admission: RE | Admit: 2017-03-01 | Discharge: 2017-03-01 | Disposition: A | Discharge status: Home / Self Care | Payer: Medicare Other | Source: Ambulatory Visit | Attending: Thoracic Surgery (Cardiothoracic Vascular Surgery) | Admitting: Thoracic Surgery (Cardiothoracic Vascular Surgery)

## 2017-03-01 ENCOUNTER — Other Ambulatory Visit: Payer: Self-pay

## 2017-03-01 ENCOUNTER — Ambulatory Visit: Payer: Medicare Other | Admitting: Emergency Medicine

## 2017-03-01 ENCOUNTER — Encounter (HOSPITAL_COMMUNITY): Payer: Self-pay

## 2017-03-01 DIAGNOSIS — C3411 Malignant neoplasm of upper lobe, right bronchus or lung: Secondary | ICD-10-CM

## 2017-03-01 HISTORY — DX: Gout, unspecified: M10.9

## 2017-03-01 HISTORY — DX: Personal history of urinary calculi: Z87.442

## 2017-03-01 HISTORY — DX: Unspecified asthma, uncomplicated: J45.909

## 2017-03-01 HISTORY — DX: Malignant (primary) neoplasm, unspecified: C80.1

## 2017-03-01 LAB — URINALYSIS, ROUTINE W REFLEX MICROSCOPIC
Bilirubin Urine: NEGATIVE
GLUCOSE, UA: NEGATIVE mg/dL
KETONES UR: 5 mg/dL — AB
Nitrite: NEGATIVE
PH: 8 (ref 5.0–8.0)
PROTEIN: 100 mg/dL — AB
Specific Gravity, Urine: 1.014 (ref 1.005–1.030)
Squamous Epithelial / LPF: NONE SEEN

## 2017-03-01 LAB — BLOOD GAS, ARTERIAL
Acid-base deficit: 3.9 mmol/L — ABNORMAL HIGH (ref 0.0–2.0)
Bicarbonate: 20.2 mmol/L (ref 20.0–28.0)
DRAWN BY: 421801
FIO2: 21
O2 SAT: 96.1 %
PATIENT TEMPERATURE: 98.6
pCO2 arterial: 33.5 mmHg (ref 32.0–48.0)
pH, Arterial: 7.397 (ref 7.350–7.450)
pO2, Arterial: 87.8 mmHg (ref 83.0–108.0)

## 2017-03-01 LAB — COMPREHENSIVE METABOLIC PANEL
ALBUMIN: 3.9 g/dL (ref 3.5–5.0)
ALT: 22 U/L (ref 17–63)
AST: 34 U/L (ref 15–41)
Alkaline Phosphatase: 112 U/L (ref 38–126)
Anion gap: 12 (ref 5–15)
BUN: 18 mg/dL (ref 6–20)
CALCIUM: 9.1 mg/dL (ref 8.9–10.3)
CHLORIDE: 103 mmol/L (ref 101–111)
CO2: 19 mmol/L — AB (ref 22–32)
CREATININE: 1.7 mg/dL — AB (ref 0.61–1.24)
GFR calc Af Amer: 43 mL/min — ABNORMAL LOW (ref >60)
GFR calc non Af Amer: 37 mL/min — ABNORMAL LOW (ref >60)
GLUCOSE: 110 mg/dL — AB (ref 65–99)
Potassium: 3.2 mmol/L — ABNORMAL LOW (ref 3.5–5.1)
SODIUM: 134 mmol/L — AB (ref 135–145)
Total Bilirubin: 1.6 mg/dL — ABNORMAL HIGH (ref 0.3–1.2)
Total Protein: 7.2 g/dL (ref 6.5–8.1)

## 2017-03-01 LAB — CBC
HCT: 40.3 % (ref 39.0–52.0)
HEMOGLOBIN: 14 g/dL (ref 13.0–17.0)
MCH: 31.3 pg (ref 26.0–34.0)
MCHC: 34.7 g/dL (ref 30.0–36.0)
MCV: 90 fL (ref 78.0–100.0)
Platelets: 319 10*3/uL (ref 150–400)
RBC: 4.48 MIL/uL (ref 4.22–5.81)
RDW: 12.9 % (ref 11.5–15.5)
WBC: 6.8 10*3/uL (ref 4.0–10.5)

## 2017-03-01 LAB — APTT: aPTT: 34 seconds (ref 24–36)

## 2017-03-01 LAB — PROTIME-INR
INR: 1.05
Prothrombin Time: 13.6 seconds (ref 11.4–15.2)

## 2017-03-01 LAB — ABO/RH: ABO/RH(D): O POS

## 2017-03-01 LAB — SURGICAL PCR SCREEN
MRSA, PCR: NEGATIVE
Staphylococcus aureus: NEGATIVE

## 2017-03-01 LAB — TYPE AND SCREEN
ABO/RH(D): O POS
Antibody Screen: NEGATIVE

## 2017-03-01 NOTE — Progress Notes (Signed)
Message sent to Dr. Roxan Hockey regarding abnormal urine.

## 2017-03-02 ENCOUNTER — Other Ambulatory Visit: Payer: Self-pay

## 2017-03-02 DIAGNOSIS — R8290 Unspecified abnormal findings in urine: Secondary | ICD-10-CM

## 2017-03-02 MED ORDER — CIPROFLOXACIN HCL 250 MG PO TABS: 250.0000 mg | ORAL_TABLET | Freq: Two times a day (BID) | ORAL | 0 refills | Status: DC

## 2017-03-02 NOTE — H&P (Signed)
PREOPERATIVE H&P  Chief Complaint: Nasopharyngeal mass  HPI: Jerry Wise is a 77 y.o. male who presents for evaluation of nasopharyngeal mass noted on recent PET scan. Patient has history of lung cancer and is scheduled for surgery with Dr. Roxan Hockey. On recent PET scan he had increased activity in the nasopharynx. On nasopharyngoscopy patient has a erythematous enlarged nasopharyngeal mass which may be tumor versus inflammation of adenoid tissue. He is taken to the OR for DL and biopsy.  Past Medical History:  Diagnosis Date  . Asthma    as child  . Cancer ([place])    lung cancer  . Chronic renal insufficiency    NEPHROLOGIST-  DR Justin Mend -- LAST VISIT MARCH 2014  . GERD (gastroesophageal reflux disease)   . Gout   . Gouty arthritis    ALL JOINTS  . History of kidney stones   . History of prostate cancer    S/P RADIACTIVE SEED IMPLANTS  . Hydronephrosis, bilateral   . Hypertension   . 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 Pernicious anemia FOLLOWED BY DR WEBB   x3 feraheme injection --- last one 03-22-2012  . Urethral stricture    Past Surgical History:  Procedure Laterality Date  . CLOSED REDUCTION CLAVICAL FX     NO HARDWARE  . COLONOSCOPY    . CYSTO/  BALLOON DILATION OF URETHRAL STRICTURE AND BLADDER BX WITH FULGERATION  09-14-2007  . CYSTOSCOPY W/ URETERAL STENT PLACEMENT N/A 04/06/2012   Procedure: CYSTOSCOPY Balloon DILATION OF STRICTURE, Fulgeration Bladder Neck, Cystogram;  Surgeon: Malka So, MD;  Location: [facility];  Service: Urology;  Laterality: N/A;  . KNEE SURGERY    . MULTIPLE TOOTH EXTRACTIONS    . PARATHYROIDECTOMY Right 04/28/2012   Procedure:  RIGHT INFERIOR PARATHYROIDECTOMY;  Surgeon: Earnstine Regal, MD;  Location: [facility];  Service: General;  Laterality: Right;  . RADIOACTIVE PROSTATE SEED IMPLANTS  2000   prostate cancer  . VIDEO BRONCHOSCOPY WITH  ENDOBRONCHIAL NAVIGATION N/A 02/02/2017   Procedure: VIDEO BRONCHOSCOPY WITH ENDOBRONCHIAL NAVIGATION  with biopsies  and Fiducial Placement;  Surgeon: Collene Gobble, MD;  Location: [facility];  Service: Thoracic;  Laterality: N/A;  . VIDEO BRONCHOSCOPY WITH ENDOBRONCHIAL ULTRASOUND N/A 02/02/2017   Procedure: VIDEO BRONCHOSCOPY WITH ENDOBRONCHIAL ULTRASOUND  with node biopsies;  Surgeon: Collene Gobble, MD;  Location: [facility];  Service: Thoracic;  Laterality: N/A;   Social History   Socioeconomic History  . Marital status: Married    Spouse name: Not on file  . Number of children: Not on file  . Years of education: Not on file  . Highest education level: Not on file  Social Needs  . Financial resource strain: Not on file  . Food insecurity - worry: Not on file  . Food insecurity - inability: Not on file  . Transportation needs - medical: Not on file  . Transportation needs - non-medical: Not on file  Occupational History  . Not on file  Tobacco Use  . Smoking status: Current Every Day Smoker    Packs/day: 0.50    Years: 55.00    Pack years: 27.50    Types: Cigarettes  . Smokeless tobacco: Never Used  . Tobacco comment: has not smoked in 2 weeks  Substance and Sexual Activity  . Alcohol use: Yes    Alcohol/week: 8.4 oz    Types: 14 Standard drinks or equivalent per  week    Comment: 3 or more SHOT DAILY  . Drug use: No  . Sexual activity: Not on file  Other Topics Concern  . Not on file  Social History Narrative  . Not on file   No family history on file. No Known Allergies Prior to Admission medications   Medication Sig Start Date End Date Taking? Authorizing Provider  acetaminophen (TYLENOL) 500 MG tablet Take 1,000 mg by mouth every 8 (eight) hours as needed for mild pain or headache.   Yes [provider]  allopurinol (ZYLOPRIM) 300 MG tablet Take 300 mg by mouth daily.    Yes [provider]  amLODipine (NORVASC) 10 MG tablet Take 10 mg by mouth daily.     Yes [provider]  aspirin EC 81 MG tablet Take 81 mg by mouth daily.   Yes [provider]  atenolol (TENORMIN) 100 MG tablet Take 100 mg by mouth every morning.    Yes [provider]  Cholecalciferol 2000 units CAPS Take 2,000 Units by mouth daily.   Yes [provider]  hydrochlorothiazide (MICROZIDE) 12.5 MG capsule Take 12.5 mg by mouth daily.   Yes [provider]  ciprofloxacin (CIPRO) 250 MG tablet Take 1 tablet (250 mg total) by mouth 2 (two) times daily for 2 days. 03/02/17 03/04/17  [name], MD     Positive ROS: Otherwise asymptomatic. No nasal bleeding. No nasal obstruction.  All other systems have been reviewed and were otherwise negative with the exception of those mentioned in the HPI and as above.  Physical Exam: There were no vitals filed for this visit.  General: Alert, no acute distress Oral: Normal oral mucosa and tonsils Nasal: Clear nasal passages anteriorly. On nasopharyngoscopy patient has an enlarged erythematous tissue in the nasopharynx more on the left side. Neck: No palpable adenopathy or thyroid nodules Ear: Ear canal is clear with normal appearing TMs Cardiovascular: Regular rate and rhythm, no murmur.  Neurologic: Alert and oriented x 3   Assessment/Plan: RUL SQUAMOUS CELL CARCINOMA Plan for Procedure(s): MEDIASTINOSCOPY VIDEO ASSISTED THORACOSCOPY (VATS)/RIGHT UPPER LOBECTOMY DIRECT LARYNGOSCOPY WITH BIOPSY   Melony Overly, MD 03/02/2017 1:35 PM

## 2017-03-02 NOTE — Anesthesia Preprocedure Evaluation (Addendum)
Anesthesia Evaluation  Patient identified by MRN, date of birth, ID band Patient awake    Reviewed: Allergy & Precautions, NPO status , Patient's Chart, lab work & pertinent test results, reviewed documented beta blocker date and time   Airway Mallampati: I  TM Distance: >3 FB Neck ROM: Full    Dental  (+) Edentulous Upper, Edentulous Lower   Pulmonary Current Smoker,    Pulmonary exam normal breath sounds clear to auscultation       Cardiovascular hypertension, Pt. on medications and Pt. on home beta blockers Normal cardiovascular exam Rhythm:Regular Rate:Normal  ECG: SR, 1st degree AV block, PVC's   Neuro/Psych negative neurological ROS  negative psych ROS   GI/Hepatic negative GI ROS, Neg liver ROS,   Endo/Other  negative endocrine ROS  Renal/GU Renal InsufficiencyRenal disease     Musculoskeletal  (+) Arthritis , Gout   Abdominal   Peds  Hematology negative hematology ROS (+)   Anesthesia Other Findings RUL SQUAMOUS CELL CARCINOMA Nasopharyngeal mass  Reproductive/Obstetrics                            Anesthesia Physical Anesthesia Plan  ASA: III  Anesthesia Plan: General   Post-op Pain Management:    Induction: Intravenous  PONV Risk Score and Plan: 1 and Treatment may vary due to age or medical condition, Ondansetron and Dexamethasone  Airway Management Planned: Oral ETT and Double Lumen EBT  Additional Equipment: Arterial line and CVP  Intra-op Plan:   Post-operative Plan: Extubation in OR and Possible Post-op intubation/ventilation  Informed Consent: I have reviewed the patients History and Physical, chart, labs and discussed the procedure including the risks, benefits and alternatives for the proposed anesthesia with the patient or authorized representative who has indicated his/her understanding and acceptance.   Dental advisory given  Plan Discussed with:  CRNA  Anesthesia Plan Comments:        Anesthesia Quick Evaluation

## 2017-03-03 ENCOUNTER — Inpatient Hospital Stay (HOSPITAL_COMMUNITY): Payer: Medicare Other

## 2017-03-03 ENCOUNTER — Encounter (HOSPITAL_COMMUNITY): Payer: Self-pay | Admitting: *Deleted

## 2017-03-03 ENCOUNTER — Encounter (HOSPITAL_COMMUNITY)
Admission: RE | Disposition: A | Discharge status: Home / Self Care | Payer: Self-pay | Source: Ambulatory Visit | Attending: Thoracic Surgery (Cardiothoracic Vascular Surgery)

## 2017-03-03 ENCOUNTER — Inpatient Hospital Stay (HOSPITAL_COMMUNITY)
Admission: RE | Admit: 2017-03-03 | Discharge: 2017-03-07 | DRG: 164 | Disposition: A | Discharge status: Home / Self Care | Payer: Medicare Other | Source: Ambulatory Visit | Attending: Thoracic Surgery (Cardiothoracic Vascular Surgery) | Admitting: Thoracic Surgery (Cardiothoracic Vascular Surgery)

## 2017-03-03 DIAGNOSIS — J9382 Other air leak: Secondary | ICD-10-CM | POA: Diagnosis not present

## 2017-03-03 DIAGNOSIS — J320 Chronic maxillary sinusitis: Secondary | ICD-10-CM | POA: Diagnosis present

## 2017-03-03 DIAGNOSIS — Z7982 Long term (current) use of aspirin: Secondary | ICD-10-CM | POA: Diagnosis not present

## 2017-03-03 DIAGNOSIS — J939 Pneumothorax, unspecified: Secondary | ICD-10-CM | POA: Diagnosis not present

## 2017-03-03 DIAGNOSIS — Z87442 Personal history of urinary calculi: Secondary | ICD-10-CM | POA: Diagnosis not present

## 2017-03-03 DIAGNOSIS — E871 Hypo-osmolality and hyponatremia: Secondary | ICD-10-CM | POA: Diagnosis not present

## 2017-03-03 DIAGNOSIS — Z902 Acquired absence of lung [part of]: Secondary | ICD-10-CM

## 2017-03-03 DIAGNOSIS — I129 Hypertensive chronic kidney disease with stage 1 through stage 4 chronic kidney disease, or unspecified chronic kidney disease: Secondary | ICD-10-CM | POA: Diagnosis present

## 2017-03-03 DIAGNOSIS — F17210 Nicotine dependence, cigarettes, uncomplicated: Secondary | ICD-10-CM | POA: Diagnosis present

## 2017-03-03 DIAGNOSIS — M1990 Unspecified osteoarthritis, unspecified site: Secondary | ICD-10-CM | POA: Diagnosis present

## 2017-03-03 DIAGNOSIS — N183 Chronic kidney disease, stage 3 (moderate): Secondary | ICD-10-CM | POA: Diagnosis present

## 2017-03-03 DIAGNOSIS — K219 Gastro-esophageal reflux disease without esophagitis: Secondary | ICD-10-CM | POA: Diagnosis present

## 2017-03-03 DIAGNOSIS — M109 Gout, unspecified: Secondary | ICD-10-CM | POA: Diagnosis present

## 2017-03-03 DIAGNOSIS — Z8546 Personal history of malignant neoplasm of prostate: Secondary | ICD-10-CM | POA: Diagnosis not present

## 2017-03-03 DIAGNOSIS — Z923 Personal history of irradiation: Secondary | ICD-10-CM | POA: Diagnosis not present

## 2017-03-03 DIAGNOSIS — C3411 Malignant neoplasm of upper lobe, right bronchus or lung: Secondary | ICD-10-CM

## 2017-03-03 DIAGNOSIS — D510 Vitamin B12 deficiency anemia due to intrinsic factor deficiency: Secondary | ICD-10-CM | POA: Diagnosis present

## 2017-03-03 DIAGNOSIS — Z09 Encounter for follow-up examination after completed treatment for conditions other than malignant neoplasm: Secondary | ICD-10-CM

## 2017-03-03 HISTORY — PX: DIRECT LARYNGOSCOPY: SHX5326

## 2017-03-03 HISTORY — PX: VIDEO ASSISTED THORACOSCOPY (VATS)/ LOBECTOMY: SHX6169

## 2017-03-03 HISTORY — PX: MEDIASTINOSCOPY: SHX5086

## 2017-03-03 LAB — FUNGAL ORGANISM REFLEX

## 2017-03-03 LAB — FUNGUS CULTURE WITH STAIN

## 2017-03-03 LAB — FUNGUS CULTURE RESULT

## 2017-03-03 SURGERY — MEDIASTINOSCOPY
Anesthesia: General | Site: Chest | Laterality: Right

## 2017-03-03 MED ORDER — ONDANSETRON HCL 4 MG/2ML IJ SOLN
INTRAMUSCULAR | Status: AC
  Filled 2017-03-03: qty 2

## 2017-03-03 MED ORDER — ATENOLOL 100 MG PO TABS
100.0000 mg | ORAL_TABLET | Freq: Every morning | ORAL | Status: DC
  Filled 2017-03-03: qty 1

## 2017-03-03 MED ORDER — ACETAMINOPHEN 500 MG PO TABS
1000.0000 mg | ORAL_TABLET | Freq: Four times a day (QID) | ORAL | Status: DC
  Administered 2017-03-03 – 2017-03-06 (×13): 1000 mg via ORAL
  Filled 2017-03-03 (×13): qty 2

## 2017-03-03 MED ORDER — PROPOFOL 10 MG/ML IV BOLUS
INTRAVENOUS | Status: AC
Start: 2017-03-03 — End: ?
  Filled 2017-03-03: qty 20

## 2017-03-03 MED ORDER — ONDANSETRON HCL 4 MG/2ML IJ SOLN: 4.0000 mg | Freq: Once | INTRAMUSCULAR | Status: DC | PRN

## 2017-03-03 MED ORDER — ONDANSETRON HCL 4 MG/2ML IJ SOLN
INTRAMUSCULAR | Status: DC | PRN
  Administered 2017-03-03: 4 mg via INTRAVENOUS

## 2017-03-03 MED ORDER — ASPIRIN EC 81 MG PO TBEC
81.0000 mg | DELAYED_RELEASE_TABLET | Freq: Every day | ORAL | Status: DC
  Administered 2017-03-04 – 2017-03-07 (×4): 81 mg via ORAL
  Filled 2017-03-03 (×4): qty 1

## 2017-03-03 MED ORDER — OXYCODONE HCL 5 MG PO TABS: 5.0000 mg | ORAL_TABLET | ORAL | Status: DC | PRN

## 2017-03-03 MED ORDER — LUNG SURGERY BOOK
Freq: Once | Status: DC
  Filled 2017-03-03: qty 1

## 2017-03-03 MED ORDER — PHENYLEPHRINE 40 MCG/ML (10ML) SYRINGE FOR IV PUSH (FOR BLOOD PRESSURE SUPPORT)
PREFILLED_SYRINGE | INTRAVENOUS | Status: AC
  Filled 2017-03-03: qty 10

## 2017-03-03 MED ORDER — ALLOPURINOL 300 MG PO TABS
300.0000 mg | ORAL_TABLET | Freq: Every day | ORAL | Status: DC
  Administered 2017-03-04 – 2017-03-07 (×4): 300 mg via ORAL
  Filled 2017-03-03 (×4): qty 1

## 2017-03-03 MED ORDER — DIPHENHYDRAMINE HCL 12.5 MG/5ML PO ELIX
12.5000 mg | ORAL_SOLUTION | Freq: Four times a day (QID) | ORAL | Status: DC | PRN
  Filled 2017-03-03: qty 5

## 2017-03-03 MED ORDER — AMLODIPINE BESYLATE 10 MG PO TABS
10.0000 mg | ORAL_TABLET | Freq: Every day | ORAL | Status: DC
  Administered 2017-03-04 – 2017-03-07 (×4): 10 mg via ORAL
  Filled 2017-03-03 (×4): qty 1

## 2017-03-03 MED ORDER — EPHEDRINE 5 MG/ML INJ
INTRAVENOUS | Status: AC
  Filled 2017-03-03: qty 10

## 2017-03-03 MED ORDER — BISACODYL 5 MG PO TBEC
10.0000 mg | DELAYED_RELEASE_TABLET | Freq: Every day | ORAL | Status: DC
  Administered 2017-03-03 – 2017-03-06 (×4): 10 mg via ORAL
  Filled 2017-03-03 (×5): qty 2

## 2017-03-03 MED ORDER — SODIUM CHLORIDE 0.9% FLUSH: 9.0000 mL | INTRAVENOUS | Status: DC | PRN

## 2017-03-03 MED ORDER — ESMOLOL HCL 100 MG/10ML IV SOLN
INTRAVENOUS | Status: AC
  Filled 2017-03-03: qty 10

## 2017-03-03 MED ORDER — SUGAMMADEX SODIUM 200 MG/2ML IV SOLN
INTRAVENOUS | Status: DC | PRN
  Administered 2017-03-03: 161.4 mg via INTRAVENOUS

## 2017-03-03 MED ORDER — SENNOSIDES-DOCUSATE SODIUM 8.6-50 MG PO TABS
1.0000 | ORAL_TABLET | Freq: Every day | ORAL | Status: DC
  Administered 2017-03-03 – 2017-03-06 (×4): 1 via ORAL
  Filled 2017-03-03 (×4): qty 1

## 2017-03-03 MED ORDER — PROPOFOL 10 MG/ML IV BOLUS
INTRAVENOUS | Status: DC | PRN
  Administered 2017-03-03: 120 mg via INTRAVENOUS

## 2017-03-03 MED ORDER — LACTATED RINGERS IV SOLN
INTRAVENOUS | Status: DC | PRN
  Administered 2017-03-03: 07:00:00 via INTRAVENOUS

## 2017-03-03 MED ORDER — CIPROFLOXACIN HCL 250 MG PO TABS
250.0000 mg | ORAL_TABLET | Freq: Two times a day (BID) | ORAL | Status: DC
  Administered 2017-03-03 – 2017-03-07 (×8): 250 mg via ORAL
  Filled 2017-03-03 (×8): qty 1

## 2017-03-03 MED ORDER — NALOXONE HCL 0.4 MG/ML IJ SOLN: 0.4000 mg | INTRAMUSCULAR | Status: DC | PRN

## 2017-03-03 MED ORDER — HEMOSTATIC AGENTS (NO CHARGE) OPTIME
TOPICAL | Status: DC | PRN
  Administered 2017-03-03: 1 via TOPICAL

## 2017-03-03 MED ORDER — ARTIFICIAL TEARS OPHTHALMIC OINT
TOPICAL_OINTMENT | OPHTHALMIC | Status: AC
  Filled 2017-03-03: qty 3.5

## 2017-03-03 MED ORDER — LIDOCAINE 2% (20 MG/ML) 5 ML SYRINGE
INTRAMUSCULAR | Status: AC
  Filled 2017-03-03: qty 5

## 2017-03-03 MED ORDER — PHENYLEPHRINE 40 MCG/ML (10ML) SYRINGE FOR IV PUSH (FOR BLOOD PRESSURE SUPPORT)
PREFILLED_SYRINGE | INTRAVENOUS | Status: DC | PRN
  Administered 2017-03-03: 40 ug via INTRAVENOUS

## 2017-03-03 MED ORDER — FENTANYL CITRATE (PF) 250 MCG/5ML IJ SOLN
INTRAMUSCULAR | Status: AC
  Filled 2017-03-03: qty 5

## 2017-03-03 MED ORDER — SODIUM CHLORIDE 0.9 % IJ SOLN
INTRAMUSCULAR | Status: DC | PRN
  Administered 2017-03-03: 50 mL via INTRAVENOUS

## 2017-03-03 MED ORDER — POTASSIUM CHLORIDE 10 MEQ/50ML IV SOLN
10.0000 meq | Freq: Every day | INTRAVENOUS | Status: DC | PRN
  Administered 2017-03-05 – 2017-03-06 (×3): 10 meq via INTRAVENOUS
  Filled 2017-03-03 (×4): qty 50

## 2017-03-03 MED ORDER — DEXAMETHASONE SODIUM PHOSPHATE 10 MG/ML IJ SOLN
INTRAMUSCULAR | Status: AC
  Filled 2017-03-03: qty 1

## 2017-03-03 MED ORDER — ONDANSETRON HCL 4 MG/2ML IJ SOLN: 4.0000 mg | Freq: Four times a day (QID) | INTRAMUSCULAR | Status: DC | PRN

## 2017-03-03 MED ORDER — MIDAZOLAM HCL 5 MG/5ML IJ SOLN
INTRAMUSCULAR | Status: DC | PRN
  Administered 2017-03-03 (×2): 1 mg via INTRAVENOUS

## 2017-03-03 MED ORDER — HYDROCHLOROTHIAZIDE 12.5 MG PO CAPS
12.5000 mg | ORAL_CAPSULE | Freq: Every day | ORAL | Status: DC
  Administered 2017-03-04 – 2017-03-07 (×4): 12.5 mg via ORAL
  Filled 2017-03-03 (×4): qty 1

## 2017-03-03 MED ORDER — TRAMADOL HCL 50 MG PO TABS: 50.0000 mg | ORAL_TABLET | Freq: Four times a day (QID) | ORAL | Status: DC | PRN

## 2017-03-03 MED ORDER — PANTOPRAZOLE SODIUM 40 MG PO TBEC
40.0000 mg | DELAYED_RELEASE_TABLET | Freq: Every day | ORAL | Status: DC
  Administered 2017-03-03 – 2017-03-07 (×5): 40 mg via ORAL
  Filled 2017-03-03 (×5): qty 1

## 2017-03-03 MED ORDER — SODIUM CHLORIDE 0.9 % IV SOLN
1.5000 g | INTRAVENOUS | Status: AC
  Administered 2017-03-03: 1.5 g via INTRAVENOUS

## 2017-03-03 MED ORDER — ACETAMINOPHEN 160 MG/5ML PO SOLN
1000.0000 mg | Freq: Four times a day (QID) | ORAL | Status: DC
  Administered 2017-03-03: 1000 mg via ORAL
  Filled 2017-03-03 (×2): qty 40.6

## 2017-03-03 MED ORDER — FENTANYL CITRATE (PF) 100 MCG/2ML IJ SOLN
INTRAMUSCULAR | Status: AC
  Filled 2017-03-03: qty 2

## 2017-03-03 MED ORDER — OXYMETAZOLINE HCL 0.05 % NA SOLN
NASAL | Status: AC
  Filled 2017-03-03: qty 15

## 2017-03-03 MED ORDER — 0.9 % SODIUM CHLORIDE (POUR BTL) OPTIME
TOPICAL | Status: DC | PRN
  Administered 2017-03-03: 1000 mL

## 2017-03-03 MED ORDER — SODIUM CHLORIDE 0.9 % IV SOLN
INTRAVENOUS | Status: AC
  Filled 2017-03-03: qty 1.5

## 2017-03-03 MED ORDER — PHENYLEPHRINE HCL 10 MG/ML IJ SOLN
INTRAVENOUS | Status: DC | PRN
  Administered 2017-03-03: 25 ug/min via INTRAVENOUS

## 2017-03-03 MED ORDER — MIDAZOLAM HCL 2 MG/2ML IJ SOLN
INTRAMUSCULAR | Status: AC
  Filled 2017-03-03: qty 2

## 2017-03-03 MED ORDER — SODIUM CHLORIDE 0.9 % IV SOLN
1.5000 g | Freq: Two times a day (BID) | INTRAVENOUS | Status: AC
  Administered 2017-03-03 – 2017-03-04 (×2): 1.5 g via INTRAVENOUS
  Filled 2017-03-03 (×2): qty 1.5

## 2017-03-03 MED ORDER — OXYMETAZOLINE HCL 0.05 % NA SOLN
NASAL | Status: DC | PRN
  Administered 2017-03-03: 1

## 2017-03-03 MED ORDER — LIDOCAINE 2% (20 MG/ML) 5 ML SYRINGE
INTRAMUSCULAR | Status: DC | PRN
  Administered 2017-03-03: 40 mg via INTRAVENOUS
  Administered 2017-03-03: 60 mg via INTRAVENOUS

## 2017-03-03 MED ORDER — FENTANYL 40 MCG/ML IV SOLN
INTRAVENOUS | Status: DC
  Administered 2017-03-03: 20 ug via INTRAVENOUS
  Administered 2017-03-03: 0 ug via INTRAVENOUS
  Administered 2017-03-03: 1000 ug via INTRAVENOUS
  Administered 2017-03-04: 0 ug via INTRAVENOUS
  Filled 2017-03-03: qty 25

## 2017-03-03 MED ORDER — LACTATED RINGERS IV SOLN
INTRAVENOUS | Status: DC | PRN
  Administered 2017-03-03 (×2): via INTRAVENOUS

## 2017-03-03 MED ORDER — DEXTROSE-NACL 5-0.9 % IV SOLN
INTRAVENOUS | Status: DC
  Administered 2017-03-03: 20:00:00 via INTRAVENOUS

## 2017-03-03 MED ORDER — DEXAMETHASONE SODIUM PHOSPHATE 10 MG/ML IJ SOLN
INTRAMUSCULAR | Status: DC | PRN
  Administered 2017-03-03: 10 mg via INTRAVENOUS

## 2017-03-03 MED ORDER — ROCURONIUM BROMIDE 10 MG/ML (PF) SYRINGE
PREFILLED_SYRINGE | INTRAVENOUS | Status: AC
  Filled 2017-03-03: qty 5

## 2017-03-03 MED ORDER — SUGAMMADEX SODIUM 200 MG/2ML IV SOLN
INTRAVENOUS | Status: AC
  Filled 2017-03-03: qty 2

## 2017-03-03 MED ORDER — ALBUTEROL SULFATE (2.5 MG/3ML) 0.083% IN NEBU
2.5000 mg | INHALATION_SOLUTION | RESPIRATORY_TRACT | Status: DC
  Administered 2017-03-03 – 2017-03-04 (×2): 2.5 mg via RESPIRATORY_TRACT
  Filled 2017-03-03 (×3): qty 3

## 2017-03-03 MED ORDER — FENTANYL CITRATE (PF) 100 MCG/2ML IJ SOLN
25.0000 ug | INTRAMUSCULAR | Status: DC | PRN
  Administered 2017-03-03 (×2): 50 ug via INTRAVENOUS

## 2017-03-03 MED ORDER — DIPHENHYDRAMINE HCL 50 MG/ML IJ SOLN
INTRAMUSCULAR | Status: AC
  Filled 2017-03-03: qty 1

## 2017-03-03 MED ORDER — DIPHENHYDRAMINE HCL 50 MG/ML IJ SOLN: 12.5000 mg | Freq: Four times a day (QID) | INTRAMUSCULAR | Status: DC | PRN

## 2017-03-03 MED ORDER — ROCURONIUM BROMIDE 10 MG/ML (PF) SYRINGE
PREFILLED_SYRINGE | INTRAVENOUS | Status: DC | PRN
  Administered 2017-03-03: 10 mg via INTRAVENOUS
  Administered 2017-03-03: 30 mg via INTRAVENOUS
  Administered 2017-03-03: 5 mg via INTRAVENOUS
  Administered 2017-03-03: 10 mg via INTRAVENOUS
  Administered 2017-03-03 (×2): 20 mg via INTRAVENOUS
  Administered 2017-03-03: 50 mg via INTRAVENOUS

## 2017-03-03 MED ORDER — FENTANYL CITRATE (PF) 100 MCG/2ML IJ SOLN
INTRAMUSCULAR | Status: DC | PRN
  Administered 2017-03-03 (×9): 50 ug via INTRAVENOUS

## 2017-03-03 MED ORDER — ROCURONIUM BROMIDE 10 MG/ML (PF) SYRINGE
PREFILLED_SYRINGE | INTRAVENOUS | Status: AC
  Filled 2017-03-03: qty 10

## 2017-03-03 MED ORDER — BUPIVACAINE LIPOSOME 1.3 % IJ SUSP
20.0000 mL | INTRAMUSCULAR | Status: AC
  Administered 2017-03-03: 20 mL
  Filled 2017-03-03: qty 20

## 2017-03-03 MED ORDER — PROPOFOL 1000 MG/100ML IV EMUL
INTRAVENOUS | Status: AC
  Filled 2017-03-03: qty 100

## 2017-03-03 SURGICAL SUPPLY — 125 items
APPLIER CLIP LOGIC TI 5 (MISCELLANEOUS) IMPLANT
APPLIER CLIP ROT 10 11.4 M/L (STAPLE)
BALLN PULM 15 16.5 18 X 75CM (BALLOONS)
BALLN PULM 15 16.5 18X75 (BALLOONS)
BALLOON PULM 15 16.5 18X75 (BALLOONS) IMPLANT
BLADE SURG 15 STRL LF DISP TIS (BLADE) IMPLANT
BLADE SURG 15 STRL SS (BLADE)
CANISTER SUCT 3000ML PPV (MISCELLANEOUS) ×12 IMPLANT
CATH KIT ON Q 5IN SLV (PAIN MANAGEMENT) IMPLANT
CATH THORACIC 28FR (CATHETERS) ×4 IMPLANT
CATH THORACIC 28FR RT ANG (CATHETERS) IMPLANT
CATH THORACIC 36FR (CATHETERS) IMPLANT
CATH THORACIC 36FR RT ANG (CATHETERS) IMPLANT
CLIP APPLIE ROT 10 11.4 M/L (STAPLE) IMPLANT
CLIP VESOCCLUDE MED 24/CT (CLIP) ×4 IMPLANT
CLIP VESOCCLUDE MED 6/CT (CLIP) ×4 IMPLANT
COAGULATOR SUCT 6 FR SWTCH (ELECTROSURGICAL) ×1
COAGULATOR SUCT SWTCH 10FR 6 (ELECTROSURGICAL) ×3 IMPLANT
CONN ST 1/4X3/8  BEN (MISCELLANEOUS)
CONN ST 1/4X3/8 BEN (MISCELLANEOUS) IMPLANT
CONN Y 3/8X3/8X3/8  BEN (MISCELLANEOUS)
CONN Y 3/8X3/8X3/8 BEN (MISCELLANEOUS) IMPLANT
CONT SPEC 4OZ CLIKSEAL STRL BL (MISCELLANEOUS) ×32 IMPLANT
COVER BACK TABLE 60X90IN (DRAPES) ×4 IMPLANT
COVER MAYO STAND STRL (DRAPES) ×4 IMPLANT
COVER SURGICAL LIGHT HANDLE (MISCELLANEOUS) ×4 IMPLANT
DERMABOND ADVANCED (GAUZE/BANDAGES/DRESSINGS) ×4
DERMABOND ADVANCED .7 DNX12 (GAUZE/BANDAGES/DRESSINGS) ×4 IMPLANT
DRAIN CHANNEL 28F RND 3/8 FF (WOUND CARE) IMPLANT
DRAIN CHANNEL 32F RND 10.7 FF (WOUND CARE) IMPLANT
DRAPE CHEST BREAST 15X10 FENES (DRAPES) ×4 IMPLANT
DRAPE HALF SHEET 40X57 (DRAPES) ×4 IMPLANT
DRAPE LAPAROSCOPIC ABDOMINAL (DRAPES) ×4 IMPLANT
DRAPE SLUSH/WARMER DISC (DRAPES) ×4 IMPLANT
DRAPE WARM FLUID 44X44 (DRAPE) IMPLANT
ELECT BLADE 4.0 EZ CLEAN MEGAD (MISCELLANEOUS) ×4
ELECT BLADE 6.5 EXT (BLADE) ×4 IMPLANT
ELECT REM PT RETURN 9FT ADLT (ELECTROSURGICAL) ×4
ELECTRODE BLDE 4.0 EZ CLN MEGD (MISCELLANEOUS) ×2 IMPLANT
ELECTRODE REM PT RTRN 9FT ADLT (ELECTROSURGICAL) ×2 IMPLANT
FELT TEFLON 1X6 (MISCELLANEOUS) ×4 IMPLANT
GAUZE SPONGE 4X4 12PLY STRL (GAUZE/BANDAGES/DRESSINGS) ×8 IMPLANT
GAUZE SPONGE 4X4 12PLY STRL LF (GAUZE/BANDAGES/DRESSINGS) ×4 IMPLANT
GAUZE SPONGE 4X4 16PLY XRAY LF (GAUZE/BANDAGES/DRESSINGS) ×4 IMPLANT
GLOVE BIO SURGEON STRL SZ7.5 (GLOVE) ×8 IMPLANT
GLOVE BIOGEL PI IND STRL 6 (GLOVE) ×6 IMPLANT
GLOVE BIOGEL PI INDICATOR 6 (GLOVE) ×6
GLOVE ECLIPSE 8.5 STRL (GLOVE) ×8 IMPLANT
GLOVE SS BIOGEL STRL SZ 7.5 (GLOVE) ×2 IMPLANT
GLOVE SUPERSENSE BIOGEL SZ 7.5 (GLOVE) ×2
GLOVE SURG SIGNA 7.5 PF LTX (GLOVE) ×8 IMPLANT
GOWN STRL REUS W/ TWL LRG LVL3 (GOWN DISPOSABLE) ×6 IMPLANT
GOWN STRL REUS W/ TWL XL LVL3 (GOWN DISPOSABLE) ×2 IMPLANT
GOWN STRL REUS W/TWL LRG LVL3 (GOWN DISPOSABLE) ×6
GOWN STRL REUS W/TWL XL LVL3 (GOWN DISPOSABLE) ×2
GUARD TEETH (MISCELLANEOUS) IMPLANT
HEMOSTAT SURGICEL 2X14 (HEMOSTASIS) ×4 IMPLANT
KIT BASIN OR (CUSTOM PROCEDURE TRAY) ×8 IMPLANT
KIT ROOM TURNOVER OR (KITS) ×4 IMPLANT
KIT SUCTION CATH 14FR (SUCTIONS) IMPLANT
NEEDLE HYPO 25GX1X1/2 BEV (NEEDLE) IMPLANT
NS IRRIG 1000ML POUR BTL (IV SOLUTION) ×12 IMPLANT
PACK CHEST (CUSTOM PROCEDURE TRAY) ×4 IMPLANT
PACK GENERAL/GYN (CUSTOM PROCEDURE TRAY) ×4 IMPLANT
PAD ARMBOARD 7.5X6 YLW CONV (MISCELLANEOUS) ×16 IMPLANT
PATTIES SURGICAL .5 X3 (DISPOSABLE) IMPLANT
POUCH ENDO CATCH II 15MM (MISCELLANEOUS) ×4 IMPLANT
POUCH SPECIMEN RETRIEVAL 10MM (ENDOMECHANICALS) IMPLANT
RELOAD STAPLER GOLD 60MM (STAPLE) ×10 IMPLANT
RELOAD STAPLER GREEN 60MM (STAPLE) ×16 IMPLANT
SCISSORS ENDO CVD 5DCS (MISCELLANEOUS) IMPLANT
SEALANT PROGEL (MISCELLANEOUS) ×4 IMPLANT
SEALANT SURG COSEAL 4ML (VASCULAR PRODUCTS) IMPLANT
SEALANT SURG COSEAL 8ML (VASCULAR PRODUCTS) IMPLANT
SHEARS HARMONIC HDI 20CM (ELECTROSURGICAL) IMPLANT
SOLUTION ANTI FOG 6CC (MISCELLANEOUS) ×8 IMPLANT
SPECIMEN JAR MEDIUM (MISCELLANEOUS) ×4 IMPLANT
SPECIMEN JAR SMALL (MISCELLANEOUS) ×4 IMPLANT
SPONGE INTESTINAL PEANUT (DISPOSABLE) ×28 IMPLANT
SPONGE TONSIL 1 RF SGL (DISPOSABLE) ×8 IMPLANT
STAPLE ECHEON FLEX 60 POW ENDO (STAPLE) ×4 IMPLANT
STAPLE RELOAD 2.5MM WHITE (STAPLE) ×24 IMPLANT
STAPLER RELOAD GOLD 60MM (STAPLE) ×20
STAPLER RELOAD GREEN 60MM (STAPLE) ×32
STAPLER VASCULAR ECHELON 35 (CUTTER) ×4 IMPLANT
SURGILUBE 2OZ TUBE FLIPTOP (MISCELLANEOUS) IMPLANT
SUT PROLENE 4 0 RB 1 (SUTURE) ×4
SUT PROLENE 4-0 RB1 .5 CRCL 36 (SUTURE) ×4 IMPLANT
SUT SILK  1 MH (SUTURE) ×4
SUT SILK 1 MH (SUTURE) ×4 IMPLANT
SUT SILK 1 TIES 10X30 (SUTURE) ×4 IMPLANT
SUT SILK 2 0 (SUTURE)
SUT SILK 2 0 SH (SUTURE) IMPLANT
SUT SILK 2 0SH CR/8 30 (SUTURE) IMPLANT
SUT SILK 2-0 18XBRD TIE 12 (SUTURE) IMPLANT
SUT SILK 3 0 SH 30 (SUTURE) IMPLANT
SUT SILK 3 0SH CR/8 30 (SUTURE) ×4 IMPLANT
SUT VIC AB 1 CTX 36 (SUTURE) ×2
SUT VIC AB 1 CTX36XBRD ANBCTR (SUTURE) ×2 IMPLANT
SUT VIC AB 2-0 CT1 27 (SUTURE)
SUT VIC AB 2-0 CT1 TAPERPNT 27 (SUTURE) IMPLANT
SUT VIC AB 2-0 CTX 36 (SUTURE) ×4 IMPLANT
SUT VIC AB 3-0 MH 27 (SUTURE) IMPLANT
SUT VIC AB 3-0 SH 18 (SUTURE) ×4 IMPLANT
SUT VIC AB 3-0 X1 27 (SUTURE) ×4 IMPLANT
SUT VICRYL 2 TP 1 (SUTURE) IMPLANT
SUT VICRYL 4-0 PS2 18IN ABS (SUTURE) ×4 IMPLANT
SYR 10ML LL (SYRINGE) ×4 IMPLANT
SYR 30ML LL (SYRINGE) ×4 IMPLANT
SYR BULB IRRIGATION 50ML (SYRINGE) ×4 IMPLANT
SYSTEM SAHARA CHEST DRAIN ATS (WOUND CARE) ×4 IMPLANT
TAPE CLOTH 4X10 WHT NS (GAUZE/BANDAGES/DRESSINGS) ×4 IMPLANT
TAPE CLOTH SURG 4X10 WHT LF (GAUZE/BANDAGES/DRESSINGS) ×4 IMPLANT
TIP APPLICATOR SPRAY EXTEND 16 (VASCULAR PRODUCTS) ×4 IMPLANT
TOWEL GREEN STERILE (TOWEL DISPOSABLE) ×8 IMPLANT
TOWEL GREEN STERILE FF (TOWEL DISPOSABLE) ×8 IMPLANT
TOWEL OR 17X24 6PK STRL BLUE (TOWEL DISPOSABLE) ×8 IMPLANT
TRAY FOLEY W/METER SILVER 16FR (SET/KITS/TRAYS/PACK) IMPLANT
TROCAR XCEL BLADELESS 5X75MML (TROCAR) ×4 IMPLANT
TUBE CONNECTING 12'X1/4 (SUCTIONS) ×2
TUBE CONNECTING 12X1/4 (SUCTIONS) ×6 IMPLANT
TUBE CONNECTING 20'X1/4 (TUBING) ×1
TUBE CONNECTING 20X1/4 (TUBING) ×3 IMPLANT
TUNNELER SHEATH ON-Q 11GX8 DSP (PAIN MANAGEMENT) IMPLANT
WATER STERILE IRR 1000ML POUR (IV SOLUTION) ×4 IMPLANT

## 2017-03-03 NOTE — Anesthesia Procedure Notes (Signed)
Procedure Name: Intubation Date/Time: 03/03/2017 7:36 AM Performed by: Julieta Bellini, CRNA Pre-anesthesia Checklist: Patient identified, Timeout performed, Patient being monitored, Suction available and Emergency Drugs available Patient Re-evaluated:Patient Re-evaluated prior to induction Oxygen Delivery Method: Circle system utilized Preoxygenation: Pre-oxygenation with 100% oxygen Induction Type: IV induction Ventilation: Two handed mask ventilation required and Oral airway inserted - appropriate to patient size Laryngoscope Size: Mac and 4 Grade View: Grade I Tube type: Oral Tube size: 7.5 mm Number of attempts: 1 Airway Equipment and Method: Stylet Placement Confirmation: ETT inserted through vocal cords under direct vision Secured at: 23 cm Tube secured with: Tape Comments: Patient intubated by Girard Cooter, SRNA

## 2017-03-03 NOTE — Anesthesia Procedure Notes (Addendum)
Procedure Name: Intubation Date/Time: 03/03/2017 9:30 AM Performed by: Julieta Bellini, CRNA Pre-anesthesia Checklist: Patient identified, Emergency Drugs available, Suction available, Patient being monitored and Timeout performed Patient Re-evaluated:Patient Re-evaluated prior to induction Oxygen Delivery Method: Circle system utilized Preoxygenation: Pre-oxygenation with 100% oxygen Induction Type: Inhalational induction with existing ETT Laryngoscope Size: Mac and 4 Grade View: Grade I Tube type: Oral Endobronchial tube: Left, EBT position confirmed by auscultation, EBT position confirmed by fiberoptic bronchoscope and Double lumen EBT and 39 Fr Number of attempts: 1 Airway Equipment and Method: Stylet and Fiberoptic brochoscope Placement Confirmation: ETT inserted through vocal cords under direct vision,  positive ETCO2 and breath sounds checked- equal and bilateral Secured at: 30 cm Tube secured with: Tape Dental Injury: Teeth and Oropharynx as per pre-operative assessment  Comments: Direct laryngoscopy performed with single lumen ETT visualized in place. Cuff deflated and patient extubated. g1v maintained and 29f left DLT VivaSight tube placed with the facilitation of it's built-in fiberoptic bronchoscopy. Left OLV successful with bronchial cuff inflation and clamping of the tracheal port. Extubation and intubation performed by AGirard Cooter SRNA under direct supervision of CRNA and anesthesiologist.

## 2017-03-03 NOTE — Transfer of Care (Signed)
Immediate Anesthesia Transfer of Care Note  Patient: Jerry Wise  Procedure(s) Performed: MEDIASTINOSCOPY (N/A ) VIDEO ASSISTED THORACOSCOPY (VATS)/RIGHT UPPER LOBECTOMY (Right Chest) DIRECT LARYNGOSCOPY WITH nasopharyngoscopy and BIOPSY (N/A )  Patient Location: PACU  Anesthesia Type:General  Level of Consciousness: drowsy and patient cooperative  Airway & Oxygen Therapy: Patient Spontanous Breathing and Patient connected to face mask  Post-op Assessment: Report given to RN, Post -op Vital signs reviewed and stable and Patient moving all extremities  Post vital signs: Reviewed  Last Vitals:  Vitals:   03/03/17 0724 03/03/17 1414  BP:  125/77  Pulse: 65 73  Resp: 14 (!) 22  Temp:  (!) 36.3 C  SpO2: 98% 100%    Last Pain:  Vitals:   03/03/17 1414  TempSrc:   PainSc: Asleep      Patients Stated Pain Goal: 1 (52/17/47 1595)  Complications: No apparent anesthesia complications

## 2017-03-03 NOTE — Anesthesia Procedure Notes (Signed)
Arterial Line Insertion Start/End2/28/2019 6:43 AM, 03/03/2017 6:45 AM Performed by: Murvin Natal, MD, Julieta Bellini, CRNA, CRNA  Patient location: Pre-op. Preanesthetic checklist: patient identified, IV checked, risks and benefits discussed, surgical consent, monitors and equipment checked, pre-op evaluation, timeout performed and anesthesia consent Lidocaine 1% used for infiltration Left, radial was placed Catheter size: 20 G Hand hygiene performed , maximum sterile barriers used  and Seldinger technique used  Attempts: 1 Procedure performed without using ultrasound guided technique. Following insertion, dressing applied and Biopatch. Post procedure assessment: normal  Patient tolerated the procedure well with no immediate complications. Additional procedure comments: Placed by Girard Cooter, SRNA under direct supervision .

## 2017-03-03 NOTE — Interval H&P Note (Signed)
History and Physical Interval Note:  03/03/2017 7:19 AM  Jerry Wise  has presented today for surgery, with the diagnosis of RUL SQUAMOUS CELL CARCINOMA  The various methods of treatment have been discussed with the patient and family. After consideration of risks, benefits and other options for treatment, the patient has consented to  Procedure(s): MEDIASTINOSCOPY (N/A) VIDEO ASSISTED THORACOSCOPY (VATS)/RIGHT UPPER LOBECTOMY (Right) DIRECT LARYNGOSCOPY WITH BIOPSY (N/A) as a surgical intervention .  The patient's history has been reviewed, patient examined, no change in status, stable for surgery.  I have reviewed the patient's chart and labs.  Questions were answered to the patient's satisfaction.     [name]

## 2017-03-03 NOTE — Brief Op Note (Addendum)
03/03/2017  1:58 PM  PATIENT:  Jerry Wise  77 y.o. 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)  PROCEDURE:   MEDIASTINOSCOPY RIGHT VATS RIGHT UPPER LOBECTOMY  MEDIASTINAL LYMPH NODE SAMPLING INTERCOSTAL NERVE BLOCK  SURGEON:  Surgeon(s) and Role: Panel 1:    * [name], MD - Primary Panel 2:    * Rozetta Nunnery, MD - Primary  PHYSICIAN ASSISTANT: WAYNE GOLD PA-C  ASSISTANTS: none   ANESTHESIA:   general and LIPOSOMAL BUPIVACAINE  EBL:  450 mL   BLOOD ADMINISTERED:none  DRAINS: 1 Chest Tube(s) in the RIGHT HEMITHORAX   LOCAL MEDICATIONS USED:  BUPIVICAINE   SPECIMEN:  Source of Specimen:  RUL AND LN SAMPLES  DISPOSITION OF SPECIMEN:  PATHOLOGY  COUNTS:  YES  TOURNIQUET:  * No tourniquets in log *  DICTATION: .Other Dictation: Dictation Number [id number]  PLAN OF CARE: Admit to inpatient   PATIENT DISPOSITION:  PACU - hemodynamically stable.   Delay start of Pharmacological VTE agent (>24hrs) due to surgical blood loss or risk of bleeding: yes  COMPLICATIONS: NO KNOWN

## 2017-03-03 NOTE — Interval H&P Note (Signed)
History and Physical Interval Note:  03/03/2017 7:24 AM  Jerry Wise  has presented today for surgery, with the diagnosis of RUL SQUAMOUS CELL CARCINOMA  The various methods of treatment have been discussed with the patient and family. After consideration of risks, benefits and other options for treatment, the patient has consented to  Procedure(s): MEDIASTINOSCOPY (N/A) VIDEO ASSISTED THORACOSCOPY (VATS)/RIGHT UPPER LOBECTOMY (Right) DIRECT LARYNGOSCOPY WITH BIOPSY (N/A) as a surgical intervention .  The patient's history has been reviewed, patient examined, no change in status, stable for surgery.  I have reviewed the patient's chart and labs.  Questions were answered to the patient's satisfaction.     Melony Overly

## 2017-03-03 NOTE — Anesthesia Procedure Notes (Signed)
Central Venous Catheter Insertion Performed by: Albertha Ghee, MD, anesthesiologist Start/End2/28/2019 6:47 AM, 03/03/2017 7:00 AM Patient location: Pre-op. Preanesthetic checklist: patient identified, IV checked, site marked, risks and benefits discussed, surgical consent, monitors and equipment checked, pre-op evaluation, timeout performed and anesthesia consent Position: Trendelenburg Lidocaine 1% used for infiltration and patient sedated Hand hygiene performed , maximum sterile barriers used  and Seldinger technique used Catheter size: 7 Fr Central line was placed.Double lumen Procedure performed using ultrasound guided technique. Ultrasound Notes:anatomy identified, needle tip was noted to be adjacent to the nerve/plexus identified and no ultrasound evidence of intravascular and/or intraneural injection Attempts: 1 Following insertion, line sutured, dressing applied and Biopatch. Post procedure assessment: blood return through all ports, free fluid flow and no air  Patient tolerated the procedure well with no immediate complications.

## 2017-03-03 NOTE — Progress Notes (Signed)
Post Op Check Awake alert doing well. No significant bleeding from nose or throat s/p nasopharyngeal biopsy. Reviewed with family that path report should be available Monday. Stable post op course.

## 2017-03-03 NOTE — Brief Op Note (Signed)
03/03/2017  4:38 PM  PATIENT:  Jerry Wise  77 y.o. male  PRE-OPERATIVE DIAGNOSIS:  RUL SQUAMOUS CELL CARCINOMA  POST-OPERATIVE DIAGNOSIS:  RUL SQUAMOUS CELL CARCINOMA  PROCEDURE:  Procedure(s): MEDIASTINOSCOPY (N/A) VIDEO ASSISTED THORACOSCOPY (VATS)/RIGHT UPPER LOBECTOMY (Right) DIRECT LARYNGOSCOPY WITH nasopharyngoscopy and BIOPSY (N/A)  SURGEON:  Surgeon(s) and Role: Panel 1:    * [name], MD - Primary Panel 2:    * Rozetta Nunnery, MD - Primary  PHYSICIAN ASSISTANT:   ASSISTANTS: none   ANESTHESIA:   general  EBL:  450 mL   BLOOD ADMINISTERED:none  DRAINS: none   LOCAL MEDICATIONS USED:  NONE  SPECIMEN:  Source of Specimen:  nasopharyngeal tissue  DISPOSITION OF SPECIMEN:  PATHOLOGY  COUNTS:  YES  TOURNIQUET:  * No tourniquets in log *  DICTATION: .Other Dictation: Dictation Number [id number]  PLAN OF CARE: Admit to inpatient   PATIENT DISPOSITION:  PACU - hemodynamically stable.   Delay start of Pharmacological VTE agent (>24hrs) due to surgical blood loss or risk of bleeding: yes

## 2017-03-04 ENCOUNTER — Inpatient Hospital Stay (HOSPITAL_COMMUNITY): Payer: Medicare Other

## 2017-03-04 ENCOUNTER — Other Ambulatory Visit: Payer: Self-pay

## 2017-03-04 ENCOUNTER — Encounter (HOSPITAL_COMMUNITY): Payer: Self-pay | Admitting: Thoracic Surgery (Cardiothoracic Vascular Surgery)

## 2017-03-04 LAB — BASIC METABOLIC PANEL
ANION GAP: 9 (ref 5–15)
BUN: 18 mg/dL (ref 6–20)
CALCIUM: 8 mg/dL — AB (ref 8.9–10.3)
CHLORIDE: 101 mmol/L (ref 101–111)
CO2: 21 mmol/L — AB (ref 22–32)
Creatinine, Ser: 1.65 mg/dL — ABNORMAL HIGH (ref 0.61–1.24)
GFR calc non Af Amer: 39 mL/min — ABNORMAL LOW (ref >60)
GFR, EST AFRICAN AMERICAN: 45 mL/min — AB (ref >60)
Glucose, Bld: 161 mg/dL — ABNORMAL HIGH (ref 65–99)
Potassium: 3.6 mmol/L (ref 3.5–5.1)
Sodium: 131 mmol/L — ABNORMAL LOW (ref 135–145)

## 2017-03-04 LAB — CBC
HEMATOCRIT: 31.3 % — AB (ref 39.0–52.0)
HEMOGLOBIN: 10.8 g/dL — AB (ref 13.0–17.0)
MCH: 30.9 pg (ref 26.0–34.0)
MCHC: 34.5 g/dL (ref 30.0–36.0)
MCV: 89.7 fL (ref 78.0–100.0)
Platelets: 308 10*3/uL (ref 150–400)
RBC: 3.49 MIL/uL — ABNORMAL LOW (ref 4.22–5.81)
RDW: 12.8 % (ref 11.5–15.5)
WBC: 12.3 10*3/uL — AB (ref 4.0–10.5)

## 2017-03-04 MED ORDER — ORAL CARE MOUTH RINSE
15.0000 mL | Freq: Two times a day (BID) | OROMUCOSAL | Status: DC
  Administered 2017-03-04 – 2017-03-07 (×5): 15 mL via OROMUCOSAL

## 2017-03-04 MED ORDER — ALBUTEROL SULFATE (2.5 MG/3ML) 0.083% IN NEBU
2.5000 mg | INHALATION_SOLUTION | Freq: Three times a day (TID) | RESPIRATORY_TRACT | Status: DC
  Administered 2017-03-04 – 2017-03-06 (×6): 2.5 mg via RESPIRATORY_TRACT
  Filled 2017-03-04 (×5): qty 3

## 2017-03-04 MED ORDER — METOCLOPRAMIDE HCL 5 MG/ML IJ SOLN
10.0000 mg | Freq: Four times a day (QID) | INTRAMUSCULAR | Status: AC
  Administered 2017-03-04 – 2017-03-05 (×4): 10 mg via INTRAVENOUS
  Filled 2017-03-04 (×4): qty 2

## 2017-03-04 MED ORDER — OXYCODONE HCL 5 MG PO TABS
5.0000 mg | ORAL_TABLET | ORAL | Status: DC | PRN
  Administered 2017-03-05 – 2017-03-06 (×5): 5 mg via ORAL
  Filled 2017-03-04 (×5): qty 1

## 2017-03-04 MED ORDER — MENTHOL 3 MG MT LOZG: 1.0000 | LOZENGE | OROMUCOSAL | Status: DC | PRN

## 2017-03-04 MED ORDER — FENTANYL CITRATE (PF) 100 MCG/2ML IJ SOLN
12.5000 ug | INTRAMUSCULAR | Status: DC | PRN
Start: 2017-03-04 — End: 2017-03-06

## 2017-03-04 MED ORDER — ATENOLOL 50 MG PO TABS
50.0000 mg | ORAL_TABLET | Freq: Every morning | ORAL | Status: DC
  Administered 2017-03-04 – 2017-03-07 (×4): 50 mg via ORAL
  Filled 2017-03-04 (×4): qty 1

## 2017-03-04 NOTE — Progress Notes (Signed)
Pt would like to inc diet to soft from liquid.

## 2017-03-04 NOTE — Progress Notes (Signed)
Pt sat at bedside; unsure if chest tube was pulled on but now draining blood around site; sutures still in place; dressing changed; linens changed; PA notified. Will continue to monitor.  [place]  Aveyah Greenwood, RN

## 2017-03-04 NOTE — Op Note (Signed)
NAMEKHYREN, HING                 ACCOUNT NO.:  [id number]  MEDICAL RECORD NO.:  [id number]  LOCATION:                                 FACILITY:  PHYSICIAN:  Revonda Standard. Roxan Hockey, M.D. DATE OF BIRTH:  DATE OF PROCEDURE:  03/03/2017 DATE OF DISCHARGE:                              OPERATIVE REPORT   PREOPERATIVE DIAGNOSIS:  Right upper lobe squamous cell carcinoma, clinical stage IA (T1, N0).  POSTOPERATIVE DIAGNOSIS:  Right upper lobe squamous cell carcinoma, clinical stage IA (T1, N0).  PROCEDURES:   Mediastinoscopy, Right video-assisted thoracoscopy, Right upper lobectomy, Mediastinal lymph node sampling, and Intercostal nerve block.  SURGEON:  Revonda Standard. Roxan Hockey, MD.  ASSISTANT:  Jadene Pierini, PA-C.  ANESTHESIA:  General.  FINDINGS:  Mediastinoscopy- frozen section revealed abundant histiocytosis.  No tumor was seen.  Extensive adhesions throughout the pleural space.  Old healed rib fracture.  Enlarged but otherwise benign- appearing nodes.  Frozen of bronchial margin negative for tumor.  CLINICAL NOTE:  Mr. Pettway is a 77 year old gentleman with a history of tobacco abuse as well as multiple other medical problems.  He had a CT as part of the lung cancer screening program, which showed a new lung nodule.  Followup CT in December shows an increase in size of the nodule to 8 mm.  The PET/CT showed metabolic uptake in the nodule.  There also was markedly hypermetabolic bilateral mediastinal hilar and infrahilar adenopathy.  Dr. Lamonte Sakai did navigational bronchoscopy and endobronchial ultrasound.  Biopsy of the right upper lobe revealed squamous cell carcinoma.  Lymph node aspirations were negative for tumor.  He was offered the option of surgical resection versus stereotactic radiation for treatment of the nodule.  The indications, risks, benefits, and alternatives were discussed in detail with the patient.  He understood and accepted the risks and agreed to  proceed.  Also, on his PET scan, he was noted to have hypermetabolic activity in the posterior nasopharynx.  He was seen in consultation by Dr. Melony Overly, who planned to do a biopsy in that area at the time of his procedure.  Given his markedly hypermetabolic nodes, it was felt that mediastinoscopy was indicated prior to surgical resection because of the possibility of a false-negative aspiration.  OPERATIVE NOTE:  Mr. Utsey was brought to the preoperative holding area on March 03, 2017.  Anesthesia placed a central line and arterial blood pressure monitoring line.  He was taken to the operating room, anesthetized, and intubated.  Dr. Melony Overly performed a direct laryngoscopy with biopsy.  Please see his separately dictated note for full details of his procedure.  The neck and chest then were prepped and draped in the usual sterile fashion.  An incision was made just above the sternal notch.  There was some scarring in this area from the patient's previous surgery.  The pretracheal fascia was identified and incised.  The pretracheal plane was developed bluntly into the mediastinum.  The mediastinoscope was inserted and systematic inspection was carried out.  There were large level 2R and 4R lymph nodes.  These were anthracotic. Both were biopsied.  They bled easily.  The biopsies were sent for frozen section. Additional  specimens were sent for permanent pathology as well.  The wound was packed with gauze for 5 minutes.  The packing was then removed.  The mediastinoscope was reinserted.  There was good hemostasis.  The incision was closed in 2 layers with 3-0 Vicryl subcutaneous sutures followed by a 4-0 Vicryl subcuticular suture. Dermabond was applied.  The biopsies returned showing abundant histiocytes.  There was no tumor seen.  The decision was made to proceed with the right upper lobectomy.  The patient was reintubated with a double-lumen endotracheal tube.   He was placed in a left lateral decubitus position and the right chest was prepped and draped in usual sterile fashion.  Single lung ventilation of the left lung was initiated and it was tolerated well throughout the procedure.  An incision was made in the seventh interspace in the midaxillary line.  A 5-mm port was inserted.  The thoracoscope was advanced into the chest.  There were no adhesions in that area, but the remainder of the lung was densely adhesed to the chest wall, particularly posteriorly and superiorly.  The upper lobe was nearly completely adhesed.  A 5-cm working incision was made in the fourth interspace anterolaterally and the adhesions were taken down.  This was a long and tedious process.  The adhesions were thin and filmy and relatively avascular.  There were severe anthracotic changes in the lung.  The fissures were barely distinguishable.  After freeing up the lung, the inferior pulmonary ligament was divided with cautery.  A level 9 lymph node was encountered.  It was removed and sent for permanent pathology.  All lymph nodes that were encountered during the dissection were sent as separate specimens.  The pleural reflection was divided at the hilum posteriorly and level 7 nodes were removed and sent for permanent pathology.  The pleural reflection then was divided at the hilum anteriorly.  Looking at the fissures, there was no way to dissect out the pulmonary vessels in the fissures, and the decision was made to work from superiorly to inferiorly.  The right superior pulmonary vein was identified.  The middle lobe branch was identified and preserved. The upper lobe branches were dissected out, encircled, and divided with the endoscopic vascular stapler.  There were dense fibrous adhesions at the takeoff of the anterior and apical branches of the pulmonary artery from the main pulmonary artery.  This was a slow and tedious dissection in this area, but ultimately  these branches were dissected free of surrounding tissue, encircled and divided with the endoscopic vascular stapler.  They were divided separately.  The division of the minor fissure then was accomplished with multiple firings of an Echelon 60-mm stapler using a gold cartridge initially and green cartridges more centrally.  This dissection was carried along the main pulmonary artery. The posterior segmental vein from the right upper lobe was reflected upwards.  The bronchus was now visible.  Nodes around the bronchus were removed and sent separately.  The Echelon stapler with a green cartridge was placed across the right upper lobe bronchus and closed.  A test inflation showed good aeration of the lower and middle lobes.  The stapler was fired transecting the right upper lobe bronchus.  The remainder of the lobectomy was performed with mass stapling along the major fissure.  This was done with the Echelon stapler again using green cartridges.  This specimen was placed into an endoscopic retrieval bag, removed and sent for frozen section of the bronchial margin which returned with  no tumor seen.  The chest was copiously irrigated with warm saline.  A test inflation showed some air leakage from along the major fissure.  ProGEL was applied to this area.  A 28-French chest tube was placed through the second port incision that was made anterior to the first.  It was used for retraction during the procedure.  The tube was secured to the skin with #1 silk suture.  The lower and middle lobes were reinflated.  The working incision was closed in 3 layers.  The original port incision was closed in 2 layers.  All sponge, needle, and instrument counts were correct at the end of the procedure.  The patient was extubated in the operating room and taken to the postanesthetic care unit in good condition.     Revonda Standard Roxan Hockey, M.D.     SCH/MEDQ  D:  03/03/2017  T:  03/04/2017  Job:   [id number]

## 2017-03-04 NOTE — Progress Notes (Addendum)
      [address]       [place] [address]             [phone]       1 Day Post-Op Procedure(s) (LRB): MEDIASTINOSCOPY (N/A) VIDEO ASSISTED THORACOSCOPY (VATS)/RIGHT UPPER LOBECTOMY (Right) DIRECT LARYNGOSCOPY WITH nasopharyngoscopy and BIOPSY (N/A)  Subjective: Patient states throat is sore and "machine has been beeping all night". Nurse reports patient not really using PCA.  Objective: Vital signs in last 24 hours: Temp:  [97 F (36.1 C)-98.3 F (36.8 C)] 98.3 F (36.8 C) (03/01 0354) Pulse Rate:  [69-87] 76 (03/01 0400) Cardiac Rhythm: Normal sinus rhythm (03/01 0400) Resp:  [8-25] 16 (03/01 0400) BP: (118-142)/(64-93) 132/75 (03/01 0354) SpO2:  [96 %-100 %] 97 % (03/01 0400) Arterial Line BP: (89-170)/(32-74) 89/74 (03/01 0400) Weight:  [177 lb 14.6 oz (80.7 kg)] 177 lb 14.6 oz (80.7 kg) (02/28 2000)  Intake/Output from previous day: 02/28 0701 - 03/01 0700 In: 4726.9 [P.O.:954; I.V.:3522.9; IV Piggyback:100] Out: 5885 [Urine:875; Blood:450; Chest Tube:210]   Physical Exam:  Cardiovascular: RRR Pulmonary: Clear to auscultation bilaterally Abdomen: Soft, non tender, bowel sounds present. Extremities: No lower extremity edema. Wounds: Clean and dry.  No erythema or signs of infection. Chest Tube: to suction, +1 air leak with cough. Bloody ooze around chest tube site.  Lab Results: CBC: Recent Labs    03/01/17 1340 03/04/17 0310  WBC 6.8 12.3*  HGB 14.0 10.8*  HCT 40.3 31.3*  PLT 319 308   BMET:  Recent Labs    03/01/17 1340 03/04/17 0310  NA 134* 131*  K 3.2* 3.6  CL 103 101  CO2 19* 21*  GLUCOSE 110* 161*  BUN 18 18  CREATININE 1.70* 1.65*  CALCIUM 9.1 8.0*    PT/INR:  Recent Labs    03/01/17 1340  LABPROT 13.6  INR 1.05   ABG:  INR: Will add last result for INR, ABG once components are confirmed Will add last 4 CBG results once components are confirmed  Assessment/Plan:  1. CV - SR in the 70's. On Atenolol  100 mg daily. Will decrease for now. 2.  Pulmonary - Chest tube with 210 cc since surgery.Chest tube is to suction. There is a +1 air leak with cough. Will place to water seal as discussed with Dr. Roxan Hockey. CXR this am appears to show small, right apical lateral pneumothorax. Encourage incentive spirometer. Await final pathology. 3. Anemia H and H 10.8 and 31.3 4. Mild hyponatremia-sodium 131 5. History of chronic renal insufficiency-Creatinine 1.65 this am (around baseline) 6. Throat lozenges PO PRN 7. Remove a line, decrease IVF 8. Remove PCA  Donielle M ZimmermanPA-C 03/04/2017,7:49 AM

## 2017-03-04 NOTE — Discharge Summary (Addendum)
Physician Discharge Summary  Patient ID: Jerry Wise MRN: [id number] DOB/AGE: December 31, 1940 77 y.o.  Admit date: 03/03/2017 Discharge date: 03/07/2017  Admission Diagnoses:  Patient Active Problem List   Diagnosis Date Noted  . Malignant neoplasm of right upper lobe of lung ([place]) 01/26/2017  . Tobacco use 01/26/2017  . Mediastinal lymphadenopathy 01/26/2017  . Prostate cancer ([place]) 01/26/2017  . Hypertension 01/26/2017  . Hyperparathyroidism, primary ([place]) 04/24/2012  . Multiple thyroid nodules 04/24/2012   Discharge Diagnoses:   Patient Active Problem List   Diagnosis Date Noted  . S/P lobectomy of lung 03/03/2017  . Malignant neoplasm of right upper lobe of lung ([place]) 01/26/2017  . Tobacco use 01/26/2017  . Mediastinal lymphadenopathy 01/26/2017  . Prostate cancer ([place]) 01/26/2017  . Hypertension 01/26/2017  . Hyperparathyroidism, primary ([place]) 04/24/2012  . Multiple thyroid nodules 04/24/2012   Discharged Condition: good  History of Present Illness:  Jerry Wise is a 77 yo AA male with PMH of tobacco abuse, Stage III CKD, Pernicious anemia, Hyperparathyroidism S/P Parathyroidectomy, prostate cancer treated with RA seeds, hypertension, and gouty arthritis.  He began participation in the lung cancer screening program in 2016.  CT scan obtained in September 2018 showed a new 6.8 mm right upper lobe nodule.  Repeat CT scan in December showed the nodule increased in size to 8.54mm.  PET CT scan was obtained in January that showed metabolic uptake in the nodule.  There was also markedly hypermetabolic bilateral mediastinal hilar and infrahilar adenopathy.  He underwent a navigational bronchoscopy and endobronchial ultrasound.  Biopsies obtained at that time showed squamous cell carcinoma.  Lymph node aspirations were negative for tumor, but were non-diagnostic.  He was referred to Dr. Roxan Hockey for surgical evaluation at which time the patient admits to continued smoking.  His is mainly  sedentary due to his arthritis.  He admitted to occasional cough, but denies shortness of breath, wheezing, and hemoptysis.  It was felt the patient would require VATS procedure with surgical resection.  The risks and benefits of the procedure were explained to the patient and he was agreeable to proceed.  He was also noted to have tonsillar hyperactivity on PET.  Due to this he was referred to ENT who recommendation nasopharyngeal biopsy.  Hospital Course:   Jerry Wise presented to [facility] on 03/03/2017.  He was taken to the operating room and underwent Direct Laryngoscopy with nasopharyngoscopy with biopsy.  He also underwent Mediastinoscopy, Right VATS, right upper lobectomy, mediastinal lymph node sampling, and intercostal nerve block.  He tolerated the procedure without difficulty, was extubated, and taken to the stepdown unit in stable condition.   He did well post operatively.  He had a small air leak, CXR showed a small right apical lateral pneumothorax.  His chest tube was transitioned to water seal on POD #1.  He complained of a sore throat and was treated lozenges with relief.  The patient had expected post surgical pain which was relieved with pain medication.  His chest tube showed a small apical space.  Air leak resolved with time and his chest tube was removed on 03/06/2017.  Follow up CXR remained stable with presence of fiducial markers.  Final pathology was non-diagnostic.  There was no evidence of tumor in the specimen, and there was concern that adhesions, inflammation, and incomplete fissures that the nodule may still be present in his lung.  Repeat VATS procedure was recommended, but patient refused and wished to be discharged home.  He has been  weaned off oxygen.  His pain is well controlled.  He is ambulating independently.  He is medically stable for discharge home today.   Treatments: surgery:    Mediastinoscopy, right video-assisted thoracoscopy, right upper lobectomy,  mediastinal lymph node sampling, and intercostal nerve block.  Disposition: 01-Home or Self Care   Discharge Medications:   Allergies as of 03/07/2017   No Known Allergies     Medication List    STOP taking these medications   ciprofloxacin 250 MG tablet Commonly known as:  CIPRO     TAKE these medications   acetaminophen 500 MG tablet Commonly known as:  TYLENOL Take 1,000 mg by mouth every 8 (eight) hours as needed for mild pain or headache.   allopurinol 300 MG tablet Commonly known as:  ZYLOPRIM Take 300 mg by mouth daily.   amLODipine 10 MG tablet Commonly known as:  NORVASC Take 10 mg by mouth daily.   aspirin EC 81 MG tablet Take 81 mg by mouth daily.   atenolol 100 MG tablet Commonly known as:  TENORMIN Take 100 mg by mouth every morning.   Cholecalciferol 2000 units Caps Take 2,000 Units by mouth daily.   hydrochlorothiazide 12.5 MG capsule Commonly known as:  MICROZIDE Take 12.5 mg by mouth daily.   oxyCODONE 5 MG immediate release tablet Commonly known as:  Oxy IR/ROXICODONE Take 1 tablet (5 mg total) by mouth every 4 (four) hours as needed for severe pain.      Follow-up Information    [name], MD Follow up on 03/15/2017.   Specialty:  Cardiothoracic Surgery Why:  Appointment is at 9:15, please get CXR at 8:30 at [facility] located on first floor of our office building Contact information: [address] [place] [address] [phone]           Signed: Ellwood Handler 03/07/2017, 12:01 PM

## 2017-03-04 NOTE — Progress Notes (Signed)
RT attempted X2 and Liliana Cline RRT attempted X1 and patient refused further attempts. Aline not drawing back.

## 2017-03-04 NOTE — Discharge Instructions (Signed)
Video-Assisted Thoracic Surgery, Care After ° °This sheet gives you information about how to care for yourself after your procedure. Your health care provider may also give you more specific instructions. If you have problems or questions, contact your health care provider. °What can I expect after the procedure? °After the procedure, it is common to have: °· Some pain and soreness in your chest. °· Pain when breathing in (inhaling) and coughing. °· Constipation. °· Fatigue. °· Difficulty sleeping. ° °Follow these instructions at home: °Preventing pneumonia °· Take deep breaths or do breathing exercises as instructed by your health care provider. Doing this helps prevent lung infection (pneumonia). °· Cough frequently. Coughing may cause discomfort, but it is important to clear mucus (phlegm) and expand your lungs. If it hurts to cough, hold a pillow against your chest or place the palms of both hands on top of the incision (use splinting) when you cough. This may help relieve discomfort. °· If you were given an incentive spirometer, use it as directed. An incentive spirometer is a tool that measures how well you are filling your lungs with each breath. °· Participate in pulmonary rehabilitation as directed by your health care provider. This is a program that combines education, exercise, and support from a team of specialists. The goal is to help you heal and get back to your normal activities as soon as possible. °Medicines °· Take over-the-counter or prescription medicines only as told by your health care provider. °· If you have pain, take pain-relieving medicine before your pain becomes severe. This is important because if your pain is under control, you will be able to breathe and cough more comfortably. °· If you were prescribed an antibiotic medicine, take it as told by your health care provider. Do not stop taking the antibiotic even if you start to feel better. °Activity °· Ask your health care provider  what activities are safe for you. °· Avoid activities that use your chest muscles for at least 3-4 weeks. °· Do not lift anything that is heavier than 10 lb (4.5 kg), or the limit that your health care provider tells you, until he or she says that it is safe. °Incision care °· Follow instructions from your health care provider about how to take care of your incision(s). Make sure you: °? Wash your hands with soap and water before you change your bandage (dressing). If soap and water are not available, use hand sanitizer. °? Change your dressing as told by your health care provider. °? Leave stitches (sutures), skin glue, or adhesive strips in place. These skin closures may need to stay in place for 2 weeks or longer. If adhesive strip edges start to loosen and curl up, you may trim the loose edges. Do not remove adhesive strips completely unless your health care provider tells you to do that. °· Keep your dressing dry until it has been removed. °· Check your incision area every day for signs of infection. Check for: °? Redness, swelling, or pain. °? Fluid or blood. °? Warmth. °? Pus or a bad smell. °Bathing °· Do not take baths, swim, or use a hot tub until your health care provider approves. You may take showers. °· After your dressing has been removed, use soap and water to gently wash your incision area. Do not use anything else to clean your incision(s) unless your health care provider tells you to do this. °Driving °· Do not drive until your health care provider approves. °· Do not drive   or use heavy machinery while taking prescription pain medicine. °Eating and drinking °· Eat a healthy, balanced diet as instructed by your health care provider. A healthy diet includes plenty of fresh fruits and vegetables, whole grains, and low-fat (lean) proteins. °· Limit foods that are high in fat and processed sugars, such as fried and sweet foods. °· Drink enough fluid to keep your urine clear or pale yellow. °General  instructions °· To prevent or treat constipation while you are taking prescription pain medicine, your health care provider may recommend that you: °? Take over-the-counter or prescription medicines. °? Eat foods that are high in fiber, such as beans, fresh fruits and vegetables, and whole grains. °· Do not use any products that contain nicotine or tobacco, such as cigarettes and e-cigarettes. If you need help quitting, ask your health care provider. °· Avoid secondhand smoke. °· Wear compression stockings as told by your health care provider. These stockings help to prevent blood clots and reduce swelling in your legs. °· If you have a chest tube, care for it as instructed by your health care provider. Do not travel by airplane during the 2 weeks after your chest tube is removed, or until your health care provider says that this is safe. °· Keep all follow-up visits as told by your health care provider. This is important. °Contact a health care provider if: °· You have redness, swelling, or pain around an incision. °· You have fluid or blood coming from an incision. °· Your incision area feels warm to the touch. °· You have pus or a bad smell coming from an incision. °· You have a fever or chills. °· You have nausea or vomiting. °· You have pain that does not get better with medicine. °Get help right away if: °· You have chest pain. °· Your heart is fluttering or beating rapidly. °· You develop a rash. °· You have shortness of breath or trouble breathing. °· You are confused. °· You have trouble speaking. °· You feel weak, light-headed, or dizzy. °· You faint. °Summary °· To help prevent lung infection (pneumonia), take deep breaths or do breathing exercises as instructed by your health care provider. °· Cough frequently to clear mucus (phlegm) and expand your lungs. If it hurts to cough, hold a pillow against your chest or place the palms of both hands on top of the incision (use splinting) when you cough. °· If  you have pain, take pain-relieving medicine before your pain becomes severe. This is important because if your pain is under control, you will be able to breathe and cough more comfortably. °· Ask your health care provider what activities are safe for you. °This information is not intended to replace advice given to you by your health care provider. Make sure you discuss any questions you have with your health care provider. °Document Released: 04/17/2012 Document Revised: 12/01/2015 Document Reviewed: 12/01/2015 °Elsevier Interactive Patient Education © 2017 [employer] ° °

## 2017-03-04 NOTE — Anesthesia Postprocedure Evaluation (Signed)
Anesthesia Post Note  Patient: Jerry Wise  Procedure(s) Performed: MEDIASTINOSCOPY (N/A ) VIDEO ASSISTED THORACOSCOPY (VATS)/RIGHT UPPER LOBECTOMY (Right Chest) DIRECT LARYNGOSCOPY WITH nasopharyngoscopy and BIOPSY (N/A )     Patient location during evaluation: PACU Anesthesia Type: General Level of consciousness: awake Pain management: pain level controlled Vital Signs Assessment: post-procedure vital signs reviewed and stable Respiratory status: spontaneous breathing, nonlabored ventilation, respiratory function stable and patient connected to nasal cannula oxygen Cardiovascular status: blood pressure returned to baseline and stable Postop Assessment: no apparent nausea or vomiting Anesthetic complications: no    Last Vitals:  Vitals:   03/04/17 0901 03/04/17 1122  BP:  129/76  Pulse:  87  Resp: 17 17  Temp:  36.9 C  SpO2:  96%    Last Pain:  Vitals:   03/04/17 1152  TempSrc:   PainSc: 5                  Ryan P Ellender

## 2017-03-04 NOTE — Op Note (Signed)
NAME:  Jerry Wise, Jerry Wise                      ACCOUNT NO.:  MEDICAL RECORD NO.:  [id number]  LOCATION:                                 FACILITY:  PHYSICIAN:  Leonides Sake. Lucia Gaskins, M.D. DATE OF BIRTH:  DATE OF PROCEDURE:  03/03/2017 DATE OF DISCHARGE:                              OPERATIVE REPORT   PREOPERATIVE DIAGNOSIS:  Nasopharyngeal mass.  POSTOPERATIVE DIAGNOSIS:  Nasopharyngeal mass.  OPERATION PERFORMED:  Nasopharyngoscopy with biopsy of nasopharyngeal mass.  SURGEON:  Leonides Sake. Lucia Gaskins, MD.  ANESTHESIA:  General endotracheal.  COMPLICATIONS:  None.  ESTIMATED BLOOD LOSS:  100 mL.  BRIEF CLINICAL NOTE:  Griffey Nicasio is a 77 year old gentleman with known lung cancer.  He had a PET scan and workup.  He was noted to have increased activity in the nasopharynx.  On nasopharyngoscopy in the office, the patient has an erythematous mass in the region of the adenoids; may represent cyst or inflamed adenoid tissue or possible tumor.  He is taken to the operating room at this time for mediastinoscopy and pneumonectomy by Dr. Roxan Hockey and had plan to performed a nasopharyngoscopy and biopsy of the nasopharyngeal mass to rule out cancer at the same time as is anesthesia for his partial pneumonectomy.  DESCRIPTION OF PROCEDURE:  After adequate endotracheal anesthesia, a mouth gag was used to expose the oropharynx.  The oropharynx was clear. A red rubber catheter was passed through the right nostril to retract the soft palate.  The nasopharynx was examined with a mirror.  The nasopharynx was mostly clear, mucosal covered.  No ulcerations. However, he had some prominent central pad of adenoid-type tissue, which was very erythematous.  An adenoid curette was used to remove the pad of adenoid tissue or nasopharyngeal mass and this was sent to Pathology as a permanent section.  Packs were placed soaked in Afrin for hemostasis. He had a fair amount of bleeding on removing this.   Packs were then removed and further hemostasis was obtained with suction cautery.  After obtaining adequate hemostasis, the nose and nasopharynx were irrigated with saline.  That was completed my portion of procedure and the patient was subsequently taken over by Dr. Roxan Hockey for scheduled Mediastinoscopy and partial pneumonectomy.          ______________________________ Leonides Sake Lucia Gaskins, M.D.     CEN/MEDQ  D:  03/03/2017  T:  03/04/2017  Job:  [id number]

## 2017-03-04 NOTE — Progress Notes (Signed)
21cc of fentanyl PCA wasted in sink with Retia Passe, RN.

## 2017-03-04 NOTE — Progress Notes (Signed)
Called by nursing concerned that patients chest tube site is bleeding.  She had changed dressing once and it saturated through the replacement in a short amount of time.  She also stated the patients chest tube got pulled on, however she did not witness how it happened;   Exam:  Chest tube remains securely sutured in place.  The chest is not bleeding, there is some serous drainage present, which can occur.  I placed a clean dry dressing and instructed nursing staff on further dressing changes.   Patient is stable, repeat CXR will be obtained in AM   Angelyn Osterberg, PA-C

## 2017-03-05 ENCOUNTER — Inpatient Hospital Stay (HOSPITAL_COMMUNITY): Payer: Medicare Other

## 2017-03-05 LAB — CBC
HEMATOCRIT: 28.8 % — AB (ref 39.0–52.0)
Hemoglobin: 9.6 g/dL — ABNORMAL LOW (ref 13.0–17.0)
MCH: 30.5 pg (ref 26.0–34.0)
MCHC: 33.3 g/dL (ref 30.0–36.0)
MCV: 91.4 fL (ref 78.0–100.0)
Platelets: 260 10*3/uL (ref 150–400)
RBC: 3.15 MIL/uL — ABNORMAL LOW (ref 4.22–5.81)
RDW: 13.2 % (ref 11.5–15.5)
WBC: 13.9 10*3/uL — ABNORMAL HIGH (ref 4.0–10.5)

## 2017-03-05 LAB — COMPREHENSIVE METABOLIC PANEL
ALBUMIN: 2.6 g/dL — AB (ref 3.5–5.0)
ALT: 13 U/L — ABNORMAL LOW (ref 17–63)
ANION GAP: 7 (ref 5–15)
AST: 22 U/L (ref 15–41)
Alkaline Phosphatase: 66 U/L (ref 38–126)
BILIRUBIN TOTAL: 1 mg/dL (ref 0.3–1.2)
BUN: 16 mg/dL (ref 6–20)
CO2: 21 mmol/L — ABNORMAL LOW (ref 22–32)
Calcium: 7.7 mg/dL — ABNORMAL LOW (ref 8.9–10.3)
Chloride: 104 mmol/L (ref 101–111)
Creatinine, Ser: 1.78 mg/dL — ABNORMAL HIGH (ref 0.61–1.24)
GFR calc Af Amer: 41 mL/min — ABNORMAL LOW (ref >60)
GFR calc non Af Amer: 35 mL/min — ABNORMAL LOW (ref >60)
GLUCOSE: 114 mg/dL — AB (ref 65–99)
POTASSIUM: 3.2 mmol/L — AB (ref 3.5–5.1)
SODIUM: 132 mmol/L — AB (ref 135–145)
TOTAL PROTEIN: 5.2 g/dL — AB (ref 6.5–8.1)

## 2017-03-05 MED ORDER — POTASSIUM CHLORIDE 10 MEQ/100ML IV SOLN
INTRAVENOUS | Status: AC
  Filled 2017-03-05: qty 100

## 2017-03-05 MED ORDER — ENOXAPARIN SODIUM 40 MG/0.4ML ~~LOC~~ SOLN
40.0000 mg | SUBCUTANEOUS | Status: DC
  Administered 2017-03-05 – 2017-03-06 (×2): 40 mg via SUBCUTANEOUS
  Filled 2017-03-05 (×2): qty 0.4

## 2017-03-05 NOTE — Progress Notes (Signed)
NT will walk with pt this morning. Will continue to monitor.   [place]  Jarrod Mcenery, RN

## 2017-03-05 NOTE — Progress Notes (Signed)
2 Days Post-Op Procedure(s) (LRB): MEDIASTINOSCOPY (N/A) VIDEO ASSISTED THORACOSCOPY (VATS)/RIGHT UPPER LOBECTOMY (Right) DIRECT LARYNGOSCOPY WITH nasopharyngoscopy and BIOPSY (N/A) Subjective: C/o pain with cough  Objective: Vital signs in last 24 hours: Temp:  [97.9 F (36.6 C)-98.7 F (37.1 C)] 98.7 F (37.1 C) (03/02 0815) Pulse Rate:  [87-106] 106 (03/02 0720) Cardiac Rhythm: Normal sinus rhythm (03/02 0758) Resp:  [17-27] 27 (03/02 0720) BP: (123-146)/(66-85) 141/85 (03/02 0954) SpO2:  [92 %-96 %] 92 % (03/02 0720)  Hemodynamic parameters for last 24 hours:    Intake/Output from previous day: 03/01 0701 - 03/02 0700 In: 1589.6 [P.O.:598; I.V.:491.6] Out: 1875 [Urine:1575; Chest Tube:300] Intake/Output this shift: Total I/O In: -  Out: 300 [Urine:300]  General appearance: alert, cooperative and no distress Neurologic: intact Heart: regular rate and rhythm Lungs: diminished breath sounds right base minimal air leak with cough  Lab Results: Recent Labs    03/04/17 0310 03/05/17 0216  WBC 12.3* 13.9*  HGB 10.8* 9.6*  HCT 31.3* 28.8*  PLT 308 260   BMET:  Recent Labs    03/04/17 0310 03/05/17 0216  NA 131* 132*  K 3.6 3.2*  CL 101 104  CO2 21* 21*  GLUCOSE 161* 114*  BUN 18 16  CREATININE 1.65* 1.78*  CALCIUM 8.0* 7.7*    PT/INR: No results for input(s): LABPROT, INR in the last 72 hours. ABG    Component Value Date/Time   PHART 7.397 03/01/2017 1340   HCO3 20.2 03/01/2017 1340   ACIDBASEDEF 3.9 (H) 03/01/2017 1340   O2SAT 96.1 03/01/2017 1340   CBG (last 3)  No results for input(s): GLUCAP in the last 72 hours.  Assessment/Plan: S/P Procedure(s) (LRB): MEDIASTINOSCOPY (N/A) VIDEO ASSISTED THORACOSCOPY (VATS)/RIGHT UPPER LOBECTOMY (Right) DIRECT LARYNGOSCOPY WITH nasopharyngoscopy and BIOPSY (N/A) -Overall doing well POD # 2 Leave CT to water seal SCD + enoxaparin for DVT prophylaxis Pulmonary hygiene CXR not done-  ordered Ambulate Advance diet   LOS: 2 days    Jerry Wise 03/05/2017

## 2017-03-05 NOTE — Progress Notes (Signed)
Checking with pharmacy about compatibility of current continuous fluid with IV runs of potassium chloride as pt's K+ is 3.2.   [place]  Nayali Talerico, RN

## 2017-03-05 NOTE — Progress Notes (Signed)
Pt agreed to ambulate in hallway with RN once he is finished with dinner.   [place]  Nyaire Denbleyker, RN

## 2017-03-06 ENCOUNTER — Inpatient Hospital Stay (HOSPITAL_COMMUNITY): Payer: Medicare Other

## 2017-03-06 LAB — BASIC METABOLIC PANEL
ANION GAP: 8 (ref 5–15)
BUN: 18 mg/dL (ref 6–20)
CALCIUM: 8.1 mg/dL — AB (ref 8.9–10.3)
CO2: 21 mmol/L — ABNORMAL LOW (ref 22–32)
Chloride: 104 mmol/L (ref 101–111)
Creatinine, Ser: 1.76 mg/dL — ABNORMAL HIGH (ref 0.61–1.24)
GFR calc Af Amer: 42 mL/min — ABNORMAL LOW (ref >60)
GFR, EST NON AFRICAN AMERICAN: 36 mL/min — AB (ref >60)
GLUCOSE: 103 mg/dL — AB (ref 65–99)
Potassium: 3.5 mmol/L (ref 3.5–5.1)
Sodium: 133 mmol/L — ABNORMAL LOW (ref 135–145)

## 2017-03-06 MED ORDER — ALBUTEROL SULFATE (2.5 MG/3ML) 0.083% IN NEBU: 2.5000 mg | INHALATION_SOLUTION | Freq: Four times a day (QID) | RESPIRATORY_TRACT | Status: DC | PRN

## 2017-03-06 NOTE — Progress Notes (Signed)
3 Days Post-Op Procedure(s) (LRB): MEDIASTINOSCOPY (N/A) VIDEO ASSISTED THORACOSCOPY (VATS)/RIGHT UPPER LOBECTOMY (Right) DIRECT LARYNGOSCOPY WITH nasopharyngoscopy and BIOPSY (N/A) Subjective: Some incisional pain, no bad  Objective: Vital signs in last 24 hours: Temp:  [98.2 F (36.8 C)-99.2 F (37.3 C)] 98.2 F (36.8 C) (03/03 0817) Pulse Rate:  [86-105] 88 (03/03 0817) Cardiac Rhythm: Normal sinus rhythm (03/03 0744) Resp:  [16-22] 22 (03/03 0817) BP: (108-148)/(54-82) 148/82 (03/03 0817) SpO2:  [94 %-95 %] 95 % (03/03 0817)  Hemodynamic parameters for last 24 hours:    Intake/Output from previous day: 03/02 0701 - 03/03 0700 In: 1149.8 [P.O.:942; I.V.:207.8] Out: 2260 [Urine:2150; Chest Tube:110] Intake/Output this shift: Total I/O In: 240 [P.O.:240] Out: 350 [Urine:350]  General appearance: alert, cooperative and no distress Neurologic: intact Heart: regular rate and rhythm Lungs: diminished breath sounds bibasilar no air leak  Lab Results: Recent Labs    03/04/17 0310 03/05/17 0216  WBC 12.3* 13.9*  HGB 10.8* 9.6*  HCT 31.3* 28.8*  PLT 308 260   BMET:  Recent Labs    03/05/17 0216 03/06/17 0435  NA 132* 133*  K 3.2* 3.5  CL 104 104  CO2 21* 21*  GLUCOSE 114* 103*  BUN 16 18  CREATININE 1.78* 1.76*  CALCIUM 7.7* 8.1*    PT/INR: No results for input(s): LABPROT, INR in the last 72 hours. ABG    Component Value Date/Time   PHART 7.397 03/01/2017 1340   HCO3 20.2 03/01/2017 1340   ACIDBASEDEF 3.9 (H) 03/01/2017 1340   O2SAT 96.1 03/01/2017 1340   CBG (last 3)  No results for input(s): GLUCAP in the last 72 hours.  Assessment/Plan: S/P Procedure(s) (LRB): MEDIASTINOSCOPY (N/A) VIDEO ASSISTED THORACOSCOPY (VATS)/RIGHT UPPER LOBECTOMY (Right) DIRECT LARYNGOSCOPY WITH nasopharyngoscopy and BIOPSY (N/A) -  No air leak, CXR shows a small apical space- dc chest tube Path still pending SCD + enoxaparin for DVT prophylaxis Continue  IS Ambulate Tolerating POs Possibly home tomorrow   LOS: 3 days    [name] 03/06/2017

## 2017-03-06 NOTE — Plan of Care (Signed)
Continue current plan of care.

## 2017-03-07 ENCOUNTER — Inpatient Hospital Stay (HOSPITAL_COMMUNITY): Payer: Medicare Other

## 2017-03-07 MED ORDER — OXYCODONE HCL 5 MG PO TABS: 5.0000 mg | ORAL_TABLET | ORAL | 0 refills | Status: DC | PRN

## 2017-03-07 NOTE — Progress Notes (Signed)
4 Days Post-Op Procedure(s) (LRB): MEDIASTINOSCOPY (N/A) VIDEO ASSISTED THORACOSCOPY (VATS)/RIGHT UPPER LOBECTOMY (Right) DIRECT LARYNGOSCOPY WITH nasopharyngoscopy and BIOPSY (N/A) Subjective: No complaints. He is dressed and wants to go home.  Objective: Vital signs in last 24 hours: Temp:  [97.7 F (36.5 C)-98.3 F (36.8 C)] 98.1 F (36.7 C) (03/04 0700) Pulse Rate:  [79-97] 89 (03/04 0700) Cardiac Rhythm: Heart block (03/04 0700) Resp:  [14-27] 22 (03/04 0700) BP: (120-153)/(62-87) 140/87 (03/04 0700) SpO2:  [94 %-98 %] 97 % (03/04 0700)  Hemodynamic parameters for last 24 hours:    Intake/Output from previous day: 03/03 0701 - 03/04 0700 In: 630 [P.O.:480; IV Piggyback:150] Out: 1350 [Urine:1350] Intake/Output this shift: No intake/output data recorded.  General appearance: alert, cooperative and no distress Neurologic: intact Heart: regular rate and rhythm Lungs: diminished breath sounds right base Wound: clean and dry  Lab Results: Recent Labs    03/05/17 0216  WBC 13.9*  HGB 9.6*  HCT 28.8*  PLT 260   BMET:  Recent Labs    03/05/17 0216 03/06/17 0435  NA 132* 133*  K 3.2* 3.5  CL 104 104  CO2 21* 21*  GLUCOSE 114* 103*  BUN 16 18  CREATININE 1.78* 1.76*  CALCIUM 7.7* 8.1*    PT/INR: No results for input(s): LABPROT, INR in the last 72 hours. ABG    Component Value Date/Time   PHART 7.397 03/01/2017 1340   HCO3 20.2 03/01/2017 1340   ACIDBASEDEF 3.9 (H) 03/01/2017 1340   O2SAT 96.1 03/01/2017 1340   CBG (last 3)  No results for input(s): GLUCAP in the last 72 hours.  Assessment/Plan: S/P Procedure(s) (LRB): MEDIASTINOSCOPY (N/A) VIDEO ASSISTED THORACOSCOPY (VATS)/RIGHT UPPER LOBECTOMY (Right) DIRECT LARYNGOSCOPY WITH nasopharyngoscopy and BIOPSY (N/A) -POD # 4  On review of CXR, noted that fiducial markers were still present. Concerned that with adhesions, inflammation and incomplete fissures the nodule might still be present. I  talked with Dr. Melina Copa from pathology. The tumor is not present in the specimen. The nodule felt on palpation was fibrotic tissue with old burned out granulomas. The nodes were all negative.   I had 2 long discussions with Jerry Wise today. His wife was present the second time.  I explained that we needed to go back in and remove additional lung to get the tumor out. That would provide the best opportunity for cure. He refuses to have repeat VATS and wishes to go home. He understands the tumor is not treated at this point, but is adamant about leaving.  I will see him back in the office next week to discuss further.   LOS: 4 days    [name] 03/07/2017

## 2017-03-07 NOTE — Progress Notes (Signed)
Pt took himself off all monitors, getting dressed pt says he is leaving he doesn't care what the doctor says he is going home. IV removed by this RN per patient request.

## 2017-03-07 NOTE — Care Management Important Message (Signed)
Important Message  Patient Details  Name: Jerry Wise MRN: [id number] Date of Birth: 04-Jun-1940   Medicare Important Message Given:  Yes    [name] Montine Circle 03/07/2017, 12:05 PM

## 2017-03-15 ENCOUNTER — Other Ambulatory Visit: Payer: Self-pay | Admitting: Thoracic Surgery (Cardiothoracic Vascular Surgery)

## 2017-03-15 ENCOUNTER — Ambulatory Visit
Admission: RE | Admit: 2017-03-15 | Discharge: 2017-03-15 | Disposition: A | Discharge status: Home / Self Care | Payer: Medicare Other | Source: Ambulatory Visit | Attending: Thoracic Surgery (Cardiothoracic Vascular Surgery) | Admitting: Thoracic Surgery (Cardiothoracic Vascular Surgery)

## 2017-03-15 ENCOUNTER — Ambulatory Visit (INDEPENDENT_AMBULATORY_CARE_PROVIDER_SITE_OTHER): Payer: Self-pay | Admitting: Thoracic Surgery (Cardiothoracic Vascular Surgery)

## 2017-03-15 VITALS — BP 160/88 | HR 100 | Resp 20 | Ht 74.0 in | Wt 171.0 lb

## 2017-03-15 DIAGNOSIS — R911 Solitary pulmonary nodule: Secondary | ICD-10-CM

## 2017-03-15 DIAGNOSIS — Z902 Acquired absence of lung [part of]: Secondary | ICD-10-CM

## 2017-03-15 DIAGNOSIS — C3411 Malignant neoplasm of upper lobe, right bronchus or lung: Secondary | ICD-10-CM

## 2017-03-15 NOTE — H&P (View-Only) (Signed)
[address]       [place] [address]             [phone]     HPI: [name] returns for a scheduled postoperative follow up visit  [name] is a 77 year old gentleman with a history of tobacco abuse who was found to have a right upper lobe nodule on a low-dose CT for lung cancer screening.  That nodule was followed.  It grew over time.  In January it showed a metabolic uptake on the PET/CT.  He had markedly hypermetabolic mediastinal and hilar adenopathy.  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  He underwent mediastinoscopy which showed the mediastinal nodes were not malignant.  He then had a right VATS for right upper lobectomy.  This was difficult due to adhesions and incomplete fissures. All of the lymph nodes resected at the time of surgery were negative for carcinoma.  There was a granuloma which was mistaken for the tumor at the time of surgery, but the tumor was not in the resected specimen.  I advised him to go back to the operating room for wedge resection to complete the lobectomy.  He refused and wanted to go home.  He says he still having pain.  He is taking pain pills a couple of times a day but not very often.  He does feel a little short of breath when he walks to the mailbox and back.  Past Medical History:  Diagnosis Date  . Asthma    as child  . Cancer ([facility])    lung cancer  . Chronic renal insufficiency    NEPHROLOGIST-  DR Justin Mend -- LAST VISIT MARCH 2014  . GERD (gastroesophageal reflux disease)   . Gout   . Gouty arthritis    ALL JOINTS  . History of kidney stones   . History of prostate cancer    S/P RADIACTIVE SEED IMPLANTS  . Hydronephrosis, bilateral   . Hypertension   . 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 Pernicious anemia FOLLOWED BY DR WEBB   x3 feraheme injection --- last one  03-22-2012  . Urethral stricture     Current Outpatient Medications  Medication Sig Dispense Refill  . acetaminophen (TYLENOL) 500 MG tablet Take 1,000 mg by mouth every 8 (eight) hours as needed for mild pain or headache.    . allopurinol (ZYLOPRIM) 300 MG tablet Take 300 mg by mouth daily.     Marland Kitchen amLODipine (NORVASC) 10 MG tablet Take 10 mg by mouth daily.     Marland Kitchen aspirin EC 81 MG tablet Take 81 mg by mouth daily.    Marland Kitchen atenolol (TENORMIN) 100 MG tablet Take 100 mg by mouth every morning.     . Cholecalciferol 2000 units CAPS Take 2,000 Units by mouth daily.    . hydrochlorothiazide (MICROZIDE) 12.5 MG capsule Take 12.5 mg by mouth daily.    Marland Kitchen oxyCODONE (OXY IR/ROXICODONE) 5 MG immediate release tablet Take 1 tablet (5 mg total) by mouth every 4 (four) hours as needed for severe pain. 30 tablet 0   No current facility-administered medications for this visit.     Physical Exam BP (!) 160/88   Pulse 100   Resp 20   Ht 6\' 2"  (1.88 m)   Wt 171 lb (77.6 kg)   SpO2 96% Comment: RA  BMI 21.53 kg/m  77 year old man in no acute distress Alert and oriented x3 with no focal deficits Lungs diminished right base, otherwise clear Incisions healing well No peripheral edema  Diagnostic Tests: CHEST - 2 VIEW  COMPARISON:  Chest x-rays dated 03/07/2017, 03/03/2017 and 03/01/2017  FINDINGS: There is a tiny loculated hydropneumothorax anteromedially just above the right hilum. Small right pleural effusion. Slight pleural thickening medially at the right apex.  Left lung is clear.  Heart size and vascularity are normal.  No acute bone abnormality. Arthritic changes of both shoulders, left more than right.  IMPRESSION: 1. Tiny right upper anterior hydropneumothorax. 2. Minimal right pleural effusion.   Electronically Signed   By: Lorriane Shire M.D.   On: 03/15/2017 09:37 I personally reviewed the chest x-ray images and concur with the findings noted above  Impression: Mr.  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  He had a thoracoscopic right upper lobectomy but had indistinct fissures and lines of resection were not clear.  He had a nodule that was mistaken for the squamous cell carcinoma that was included in the resection, but turned out to be an old granuloma.  The actual tumor was not in the resection specimen. On a positive note all of the nodes came back benign.  There was extensive histiocytosis in the nodes.  I advised him while he was still in the hospital that we needed to go back in and do a wedge resection to remove the remainder of the upper lobe.  Unfortunately, he refused to do that.    I had a long discussion with him again today regarding the same issue.  I recommended that we go ahead and do the redo VATS at this point.  He is reluctant to do so because he is in pain.  But I explained that I thought it would better to do it now rather wait till his pain resolves and then have to go through the whole process again.  He still is reluctant to consider going back in.  I asked him to return on Thursday to discuss this matter further.  If he decides he does not want to do that SBRT is an option although I think surgery would be better.  He says that he has adequate pain medication remaining.  [name], MD [facility] [phone]

## 2017-03-15 NOTE — Progress Notes (Signed)
[address]       [place],Jerry Wise             [phone]     HPI: Jerry Wise returns for a scheduled postoperative follow up visit  Jerry Wise is a 77 year old gentleman with a history of tobacco abuse who was found to have a right upper lobe nodule on a low-dose CT for lung cancer screening.  That nodule was followed.  It grew over time.  In January it showed a metabolic uptake on the PET/CT.  He had markedly hypermetabolic mediastinal and hilar adenopathy.  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  He underwent mediastinoscopy which showed the mediastinal nodes were not malignant.  He then had a right VATS for right upper lobectomy.  This was difficult due to adhesions and incomplete fissures. All of the lymph nodes resected at the time of surgery were negative for carcinoma.  There was a granuloma which was mistaken for the tumor at the time of surgery, but the tumor was not in the resected specimen.  I advised him to go back to the operating room for wedge resection to complete the lobectomy.  He refused and wanted to go home.  He says he still having pain.  He is taking pain pills a couple of times a day but not very often.  He does feel a little short of breath when he walks to the mailbox and back.  Past Medical History:  Diagnosis Date  . Asthma    as child  . Cancer ([facility])    lung cancer  . Chronic renal insufficiency    NEPHROLOGIST-  Jerry Jerry Wise -- LAST VISIT MARCH 2014  . GERD (gastroesophageal reflux disease)   . Gout   . Gouty arthritis    ALL JOINTS  . History of kidney stones   . History of prostate cancer    S/P RADIACTIVE SEED IMPLANTS  . Hydronephrosis, bilateral   . Hypertension   . 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 Pernicious anemia FOLLOWED BY Jerry Wise   x3 feraheme injection --- last one  03-22-2012  . Urethral stricture     Current Outpatient Medications  Medication Sig Dispense Refill  . acetaminophen (TYLENOL) 500 MG tablet Take 1,000 mg by mouth every 8 (eight) hours as needed for mild pain or headache.    . allopurinol (ZYLOPRIM) 300 MG tablet Take 300 mg by mouth daily.     Marland Kitchen amLODipine (NORVASC) 10 MG tablet Take 10 mg by mouth daily.     Marland Kitchen aspirin EC 81 MG tablet Take 81 mg by mouth daily.    Marland Kitchen atenolol (TENORMIN) 100 MG tablet Take 100 mg by mouth every morning.     . Cholecalciferol 2000 units CAPS Take 2,000 Units by mouth daily.    . hydrochlorothiazide (MICROZIDE) 12.5 MG capsule Take 12.5 mg by mouth daily.    Marland Kitchen oxyCODONE (OXY IR/ROXICODONE) 5 MG immediate release tablet Take 1 tablet (5 mg total) by mouth every 4 (four) hours as needed for severe pain. 30 tablet 0   No current facility-administered medications for this visit.     Physical Exam BP (!) 160/88   Pulse 100   Resp 20   Ht 6\' 2"  (1.88 m)   Wt 171 lb (77.6 kg)   SpO2 96% Comment: RA  BMI 21.34 kg/m  77 year old man in no acute distress Alert and oriented x3 with no focal deficits Lungs diminished right base, otherwise clear Incisions healing well No peripheral edema  Diagnostic Tests: CHEST - 2 VIEW  COMPARISON:  Chest x-rays dated 03/07/2017, 03/03/2017 and 03/01/2017  FINDINGS: There is a tiny loculated hydropneumothorax anteromedially just above the right hilum. Small right pleural effusion. Slight pleural thickening medially at the right apex.  Left lung is clear.  Heart size and vascularity are normal.  No acute bone abnormality. Arthritic changes of both shoulders, left more than right.  IMPRESSION: 1. Tiny right upper anterior hydropneumothorax. 2. Minimal right pleural effusion.   Electronically Signed   By: Jerry Wise M.D.   On: 03/15/2017 09:37 I personally reviewed the chest x-ray images and concur with the findings noted above  Impression: Mr.  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  He had a thoracoscopic right upper lobectomy but had indistinct fissures and lines of resection were not clear.  He had a nodule that was mistaken for the squamous cell carcinoma that was included in the resection, but turned out to be an old granuloma.  The actual tumor was not in the resection specimen. On a positive note all of the nodes came back benign.  There was extensive histiocytosis in the nodes.  I advised him while he was still in the hospital that we needed to go back in and do a wedge resection to remove the remainder of the upper lobe.  Unfortunately, he refused to do that.    I had a long discussion with him again today regarding the same issue.  I recommended that we go ahead and do the redo VATS at this point.  He is reluctant to do so because he is in pain.  But I explained that I thought it would better to do it now rather wait till his pain resolves and then have to go through the whole process again.  He still is reluctant to consider going back in.  I asked him to return on Thursday to discuss this matter further.  If he decides he does not want to do that SBRT is an option although I think surgery would be better.  He says that he has adequate pain medication remaining.  [name], MD [facility] [phone]

## 2017-03-16 ENCOUNTER — Other Ambulatory Visit: Payer: Self-pay | Admitting: *Deleted

## 2017-03-16 DIAGNOSIS — R911 Solitary pulmonary nodule: Secondary | ICD-10-CM

## 2017-03-17 ENCOUNTER — Encounter: Payer: Medicare Other | Admitting: Thoracic Surgery (Cardiothoracic Vascular Surgery)

## 2017-03-17 NOTE — Pre-Procedure Instructions (Signed)
Jerry Wise  03/17/2017      [id number] [address], [place] [address] Phone: [phone] Fax: [phone]    Your procedure is scheduled on March 18  Report to [place] at 1100 A.M.  Call this number if you have problems the morning of surgery:  [phone]   Remember:  Do not eat food or drink liquids after midnight.  Continue all medications as directed by your physician except follow these medication instructions before surgery below   Take these medicines the morning of surgery with A SIP OF WATER  acetaminophen (TYLENOL)  amLODipine (NORVASC) atenolol (TENORMIN oxyCODONE (OXY IR/ROXICODONE)   7 days prior to surgery STOP taking any Aspirin(unless otherwise instructed by your surgeon), Aleve, Naproxen, Ibuprofen, Motrin, Advil, Goody's, BC's, all herbal medications, fish oil, and all vitamins    Do not wear jewelry  Do not wear lotions, powders, or cologne, or deodorant.  Men may shave face and neck.  Do not bring valuables to the hospital.  [facility] is not responsible for any belongings or valuables.  Contacts, dentures or bridgework may not be worn into surgery.  Leave your suitcase in the car.  After surgery it may be brought to your room.  For patients admitted to the hospital, discharge time will be determined by your treatment team.  Patients discharged the day of surgery will not be allowed to drive home.    Special instructions:   [place]- Preparing For Surgery  Before surgery, you can play an important role. Because skin is not sterile, your skin needs to be as free of germs as possible. You can reduce the number of germs on your skin by washing with CHG (chlorahexidine gluconate) Soap before surgery.  CHG is an antiseptic cleaner which kills germs and bonds with the skin to continue killing germs even after washing.  Please do not use if you have an  allergy to CHG or antibacterial soaps. If your skin becomes reddened/irritated stop using the CHG.  Do not shave (including legs and underarms) for at least 48 hours prior to first CHG shower. It is OK to shave your face.  Please follow these instructions carefully.   1. Shower the NIGHT BEFORE SURGERY and the MORNING OF SURGERY with CHG.   2. If you chose to wash your hair, wash your hair first as usual with your normal shampoo.  3. After you shampoo, rinse your hair and body thoroughly to remove the shampoo.  4. Use CHG as you would any other liquid soap. You can apply CHG directly to the skin and wash gently with a scrungie or a clean washcloth.   5. Apply the CHG Soap to your body ONLY FROM THE NECK DOWN.  Do not use on open wounds or open sores. Avoid contact with your eyes, ears, mouth and genitals (private parts). Wash Face and genitals (private parts)  with your normal soap.  6. Wash thoroughly, paying special attention to the area where your surgery will be performed.  7. Thoroughly rinse your body with warm water from the neck down.  8. DO NOT shower/wash with your normal soap after using and rinsing off the CHG Soap.  9. Pat yourself dry with a CLEAN TOWEL.  10. Wear CLEAN PAJAMAS to bed the night before surgery, wear comfortable clothes the morning of surgery  11. Place CLEAN SHEETS on your bed the night of your first  shower and DO NOT SLEEP WITH PETS.    Day of Surgery: Do not apply any deodorants/lotions. Please wear clean clothes to the hospital/surgery center.      Please read over the following fact sheets that you were given.

## 2017-03-18 ENCOUNTER — Other Ambulatory Visit: Payer: Self-pay | Admitting: *Deleted

## 2017-03-18 ENCOUNTER — Encounter (HOSPITAL_COMMUNITY)
Admission: RE | Admit: 2017-03-18 | Discharge: 2017-03-18 | Disposition: A | Discharge status: Home / Self Care | Payer: Medicare Other | Source: Ambulatory Visit | Attending: Thoracic Surgery (Cardiothoracic Vascular Surgery) | Admitting: Thoracic Surgery (Cardiothoracic Vascular Surgery)

## 2017-03-18 ENCOUNTER — Encounter (HOSPITAL_COMMUNITY): Payer: Self-pay

## 2017-03-18 ENCOUNTER — Other Ambulatory Visit: Payer: Self-pay

## 2017-03-18 ENCOUNTER — Ambulatory Visit (HOSPITAL_COMMUNITY)
Admission: RE | Admit: 2017-03-18 | Discharge: 2017-03-18 | Disposition: A | Discharge status: Home / Self Care | Payer: Medicare Other | Source: Ambulatory Visit | Attending: Thoracic Surgery (Cardiothoracic Vascular Surgery) | Admitting: Thoracic Surgery (Cardiothoracic Vascular Surgery)

## 2017-03-18 DIAGNOSIS — N189 Chronic kidney disease, unspecified: Secondary | ICD-10-CM | POA: Insufficient documentation

## 2017-03-18 DIAGNOSIS — Z7982 Long term (current) use of aspirin: Secondary | ICD-10-CM | POA: Insufficient documentation

## 2017-03-18 DIAGNOSIS — I129 Hypertensive chronic kidney disease with stage 1 through stage 4 chronic kidney disease, or unspecified chronic kidney disease: Secondary | ICD-10-CM | POA: Insufficient documentation

## 2017-03-18 DIAGNOSIS — Z87891 Personal history of nicotine dependence: Secondary | ICD-10-CM | POA: Insufficient documentation

## 2017-03-18 DIAGNOSIS — Z8546 Personal history of malignant neoplasm of prostate: Secondary | ICD-10-CM

## 2017-03-18 DIAGNOSIS — Z79899 Other long term (current) drug therapy: Secondary | ICD-10-CM

## 2017-03-18 DIAGNOSIS — Z01818 Encounter for other preprocedural examination: Secondary | ICD-10-CM

## 2017-03-18 DIAGNOSIS — R911 Solitary pulmonary nodule: Secondary | ICD-10-CM | POA: Insufficient documentation

## 2017-03-18 DIAGNOSIS — K219 Gastro-esophageal reflux disease without esophagitis: Secondary | ICD-10-CM

## 2017-03-18 DIAGNOSIS — Z01812 Encounter for preprocedural laboratory examination: Secondary | ICD-10-CM | POA: Insufficient documentation

## 2017-03-18 HISTORY — DX: Dyspnea, unspecified: R06.00

## 2017-03-18 LAB — URINALYSIS, ROUTINE W REFLEX MICROSCOPIC
BACTERIA UA: NONE SEEN
WBC, UA: NONE SEEN WBC/hpf (ref 0–5)

## 2017-03-18 LAB — COMPREHENSIVE METABOLIC PANEL
ALT: 16 U/L — AB (ref 17–63)
ANION GAP: 13 (ref 5–15)
AST: 28 U/L (ref 15–41)
Albumin: 3.4 g/dL — ABNORMAL LOW (ref 3.5–5.0)
Alkaline Phosphatase: 119 U/L (ref 38–126)
BUN: 12 mg/dL (ref 6–20)
CHLORIDE: 104 mmol/L (ref 101–111)
CO2: 20 mmol/L — AB (ref 22–32)
CREATININE: 1.58 mg/dL — AB (ref 0.61–1.24)
Calcium: 9 mg/dL (ref 8.9–10.3)
GFR, EST AFRICAN AMERICAN: 47 mL/min — AB (ref >60)
GFR, EST NON AFRICAN AMERICAN: 41 mL/min — AB (ref >60)
Glucose, Bld: 112 mg/dL — ABNORMAL HIGH (ref 65–99)
Potassium: 3.7 mmol/L (ref 3.5–5.1)
SODIUM: 137 mmol/L (ref 135–145)
Total Bilirubin: 0.8 mg/dL (ref 0.3–1.2)
Total Protein: 7.3 g/dL (ref 6.5–8.1)

## 2017-03-18 LAB — CBC
HCT: 32.8 % — ABNORMAL LOW (ref 39.0–52.0)
Hemoglobin: 11.2 g/dL — ABNORMAL LOW (ref 13.0–17.0)
MCH: 30.8 pg (ref 26.0–34.0)
MCHC: 34.1 g/dL (ref 30.0–36.0)
MCV: 90.1 fL (ref 78.0–100.0)
PLATELETS: 627 10*3/uL — AB (ref 150–400)
RBC: 3.64 MIL/uL — AB (ref 4.22–5.81)
RDW: 13.1 % (ref 11.5–15.5)
WBC: 10.5 10*3/uL (ref 4.0–10.5)

## 2017-03-18 LAB — PROTIME-INR
INR: 1.04
PROTHROMBIN TIME: 13.5 s (ref 11.4–15.2)

## 2017-03-18 LAB — BLOOD GAS, ARTERIAL
ACID-BASE DEFICIT: 0.1 mmol/L (ref 0.0–2.0)
Bicarbonate: 23.3 mmol/L (ref 20.0–28.0)
DRAWN BY: 421801
FIO2: 21
O2 SAT: 98.2 %
PATIENT TEMPERATURE: 98.6
PO2 ART: 107 mmHg (ref 83.0–108.0)
pCO2 arterial: 33.5 mmHg (ref 32.0–48.0)
pH, Arterial: 7.458 — ABNORMAL HIGH (ref 7.350–7.450)

## 2017-03-18 LAB — APTT: APTT: 35 s (ref 24–36)

## 2017-03-18 NOTE — Progress Notes (Signed)
PCP is Dr. Josetta Huddle Nephrologist is Dr Justin Mend Denies seeing a cardiologist. Denies ever having card cath, stress test, or echo. Denies cough or fever, does report soreness to right chest from last surgery.  Reports he will not take aspirin on the day of surgery.  EKG noted in epic from 03-01-17

## 2017-03-18 NOTE — Progress Notes (Signed)
Spoke to [place] at Dr [employer] office states she placed an order for Urine on the day of surgery.  Urine cup placed in chart.

## 2017-03-18 NOTE — Progress Notes (Signed)
Message left for Jerry Wise at Dr [facility] office to review labs.

## 2017-03-18 NOTE — Progress Notes (Signed)
Anesthesia Chart Review: Patient is a 77 year old male scheduled for redo right VATS, wedge resection on 03/21/17 by Dr. Modesto Charon. He is s/p mediastinoscopy, VATS/RU lobectomy, direct laryngoscopy 03/03/17. According to notes, initial bronchoscopy with RUL biopsy showed SCC, but 03/03/17 RU lobe specimen did not show evidence of carcinoma, but there was concern that the nodule may still be present in the lung. Multiple lymph nodes showed extensive histiocytosis, but no carcinoma. Patient initially did not agree to redo VATS, but has now consented.    Other history includes former smoker (quit 01/04/17), GERD, HTN, dyspnea, gouty arthritis, prostate cancer (s/p radioactive seed implant '00), nephrolithiasis, HTN, pernicious anemia, chronic kidney disease, bilateral hydronephrosis with outlet obstruction (s/p cystoscopy, balloon dilation of structure, fulgeration bladder neck 04/06/12, balloon bladder biopsy with fulgaration 09/14/07), primary hyperparathyroidism (s/p right inferior parathyroidectomy 04/28/12).  - PCP is Dr. Josetta Huddle.  - Pulmonologist is Dr. [name].  - Nephrologist is Dr. Edrick Oh. - Urologist is Dr. Irine Seal.  - ENT is Dr. Melony Overly. 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  - RAD-ONC is Dr. Kyung Rudd. Radiation therapy was being considered if patient refused redo VATS. - He denied seeing a cardiologist or having a prior stress test, echo, or cath.   Meds include allopurinol, amlodipine, aspirin 81 mg, atenolol, HCTZ, OxyIR. ASA to be held on the day of surgery.   BP 136/77   Pulse 77   Temp 37 C   Resp 20   Ht 6\' 2"  (1.88 m)   Wt 170 lb 9.6 oz (77.4 kg)   SpO2 99%   BMI 21.90 kg/m   EKG 03/01/17: SR with first-degree AV block, occasional PVC, left axis deviation.  CXR 03/18/17: IMPRESSION: 1. Stable mild right upper anterior hydropneumothorax versus cavitation again noted. No interim change. Stable  mediastinal prominence. 2. Low lung volumes. Stable small right pleural effusion. Chest is unchanged from prior exam.  CT Super D Chest 01/31/17: IMPRESSION: 1. Nonspecific mild bilateral mediastinal and bilateral infrahilar adenopathy, not appreciably changed from 01/24/2017 PET-CT, where these nodes were seen to be hypermetabolic. Metastatic nodal disease cannot be excluded. 2. Dominant partially cavitary 1.0 cm right upper lobe pulmonary nodule, not appreciably changed since 12/30/2016 chest CT, which was mildly hypermetabolic on recent PET-CT, cannot exclude primary bronchogenic carcinoma. 3. Background centrilobular micronodularity in both lungs, upper lobe predominant, most suggestive of respiratory bronchiolitis. 4. Left main and 3 vessel coronary atherosclerosis. Aortic Atherosclerosis (ICD10-I70.0) and Emphysema (ICD10-J43.9).  PFTs 01/20/17 (pre RU lobectomy): FVC 3.27 (75%), FEV1 2.09 (64%), DLCO unc 18.42 (48%).  Preoperative labs noted. Cr 1.58--which is consistent with results in Epic since 04/28/12 (Cr range since then 1.56-1.99). Glucose 112. H/H 11.2/32.8. PLT 627K (previously [id number] 03/04/17-03/05/17). PT/PTT WNL. T&S done. UA color was red, so urinalysis could not be completed. Microscopic showed RBC/HPF too numerous to count, no WBC or bacteria noted. Dr. Roxan Hockey is aware and wants to keep case as scheduled for now and repeat UA on the day of surgery. Discussed above with anesthesiologist Dr. Charolett Bumpers and will add repeat STAT CBC for the day of surgery as well (to check for worsening anemia or leukocytosis in the setting of abnormal UA--and can also re-evaluate PLT count).  Anesthesiologist and surgeon to evaluate patient on the day of surgery. Repeat UA and CBC results will need to be reviewed at that time. Definitive plan following their evaluations.    Myra Gianotti, PA-C [facility] Short Stay Center/Anesthesiology Phone [phone]  03/18/2017 3:58 PM

## 2017-03-19 LAB — ACID FAST CULTURE WITH REFLEXED SENSITIVITIES: ACID FAST CULTURE - AFSCU3: NEGATIVE

## 2017-03-19 LAB — ACID FAST CULTURE WITH REFLEXED SENSITIVITIES (MYCOBACTERIA)

## 2017-03-21 ENCOUNTER — Encounter (HOSPITAL_COMMUNITY)
Admission: RE | Disposition: A | Discharge status: Home / Self Care | Payer: Self-pay | Source: Ambulatory Visit | Attending: Thoracic Surgery (Cardiothoracic Vascular Surgery)

## 2017-03-21 ENCOUNTER — Inpatient Hospital Stay (HOSPITAL_COMMUNITY): Payer: Medicare Other | Admitting: Certified Registered Nurse Anesthetist

## 2017-03-21 ENCOUNTER — Inpatient Hospital Stay (HOSPITAL_COMMUNITY): Payer: Medicare Other

## 2017-03-21 ENCOUNTER — Inpatient Hospital Stay (HOSPITAL_COMMUNITY): Payer: Medicare Other | Admitting: Vascular Surgery

## 2017-03-21 ENCOUNTER — Inpatient Hospital Stay (HOSPITAL_COMMUNITY)
Admission: RE | Admit: 2017-03-21 | Discharge: 2017-03-26 | DRG: 164 | Disposition: A | Discharge status: Home / Self Care | Payer: Medicare Other | Source: Ambulatory Visit | Attending: Thoracic Surgery (Cardiothoracic Vascular Surgery) | Admitting: Thoracic Surgery (Cardiothoracic Vascular Surgery)

## 2017-03-21 ENCOUNTER — Encounter (HOSPITAL_COMMUNITY): Payer: Self-pay | Admitting: Certified Registered Nurse Anesthetist

## 2017-03-21 DIAGNOSIS — K219 Gastro-esophageal reflux disease without esophagitis: Secondary | ICD-10-CM | POA: Diagnosis present

## 2017-03-21 DIAGNOSIS — D62 Acute posthemorrhagic anemia: Secondary | ICD-10-CM | POA: Diagnosis not present

## 2017-03-21 DIAGNOSIS — R911 Solitary pulmonary nodule: Secondary | ICD-10-CM | POA: Diagnosis present

## 2017-03-21 DIAGNOSIS — N183 Chronic kidney disease, stage 3 (moderate): Secondary | ICD-10-CM | POA: Diagnosis present

## 2017-03-21 DIAGNOSIS — M109 Gout, unspecified: Secondary | ICD-10-CM | POA: Diagnosis present

## 2017-03-21 DIAGNOSIS — D510 Vitamin B12 deficiency anemia due to intrinsic factor deficiency: Secondary | ICD-10-CM | POA: Diagnosis present

## 2017-03-21 DIAGNOSIS — J9382 Other air leak: Secondary | ICD-10-CM | POA: Diagnosis not present

## 2017-03-21 DIAGNOSIS — Z87891 Personal history of nicotine dependence: Secondary | ICD-10-CM | POA: Diagnosis not present

## 2017-03-21 DIAGNOSIS — E876 Hypokalemia: Secondary | ICD-10-CM | POA: Diagnosis not present

## 2017-03-21 DIAGNOSIS — I129 Hypertensive chronic kidney disease with stage 1 through stage 4 chronic kidney disease, or unspecified chronic kidney disease: Secondary | ICD-10-CM | POA: Diagnosis present

## 2017-03-21 DIAGNOSIS — Z8546 Personal history of malignant neoplasm of prostate: Secondary | ICD-10-CM

## 2017-03-21 DIAGNOSIS — Z87442 Personal history of urinary calculi: Secondary | ICD-10-CM

## 2017-03-21 DIAGNOSIS — Z7982 Long term (current) use of aspirin: Secondary | ICD-10-CM | POA: Diagnosis not present

## 2017-03-21 DIAGNOSIS — Z902 Acquired absence of lung [part of]: Secondary | ICD-10-CM

## 2017-03-21 DIAGNOSIS — Z4682 Encounter for fitting and adjustment of non-vascular catheter: Secondary | ICD-10-CM

## 2017-03-21 DIAGNOSIS — Z923 Personal history of irradiation: Secondary | ICD-10-CM

## 2017-03-21 DIAGNOSIS — C3411 Malignant neoplasm of upper lobe, right bronchus or lung: Secondary | ICD-10-CM | POA: Diagnosis not present

## 2017-03-21 HISTORY — PX: VIDEO ASSISTED THORACOSCOPY (VATS)/WEDGE RESECTION: SHX6174

## 2017-03-21 LAB — URINALYSIS, ROUTINE W REFLEX MICROSCOPIC
Bilirubin Urine: NEGATIVE
GLUCOSE, UA: NEGATIVE mg/dL
KETONES UR: NEGATIVE mg/dL
Nitrite: NEGATIVE
PROTEIN: 100 mg/dL — AB
Specific Gravity, Urine: 1.013 (ref 1.005–1.030)
Squamous Epithelial / LPF: NONE SEEN
pH: 7 (ref 5.0–8.0)

## 2017-03-21 LAB — POCT I-STAT 7, (LYTES, BLD GAS, ICA,H+H)
Acid-base deficit: 3 mmol/L — ABNORMAL HIGH (ref 0.0–2.0)
Acid-base deficit: 3 mmol/L — ABNORMAL HIGH (ref 0.0–2.0)
Bicarbonate: 23.2 mmol/L (ref 20.0–28.0)
Bicarbonate: 24.6 mmol/L (ref 20.0–28.0)
Calcium, Ion: 1.14 mmol/L — ABNORMAL LOW (ref 1.15–1.40)
Calcium, Ion: 1.09 mmol/L — ABNORMAL LOW (ref 1.15–1.40)
HCT: 25 % — ABNORMAL LOW (ref 39.0–52.0)
HEMATOCRIT: 30 % — AB (ref 39.0–52.0)
HEMOGLOBIN: 10.2 g/dL — AB (ref 13.0–17.0)
Hemoglobin: 8.5 g/dL — ABNORMAL LOW (ref 13.0–17.0)
O2 Saturation: 96 %
O2 Saturation: 100 %
POTASSIUM: 4.4 mmol/L (ref 3.5–5.1)
Potassium: 3.9 mmol/L (ref 3.5–5.1)
SODIUM: 136 mmol/L (ref 135–145)
Sodium: 136 mmol/L (ref 135–145)
TCO2: 25 mmol/L (ref 22–32)
TCO2: 26 mmol/L (ref 22–32)
pCO2 arterial: 45.9 mmHg (ref 32.0–48.0)
pCO2 arterial: 57.1 mmHg — ABNORMAL HIGH (ref 32.0–48.0)
pH, Arterial: 7.313 — ABNORMAL LOW (ref 7.350–7.450)
pH, Arterial: 7.242 — ABNORMAL LOW (ref 7.350–7.450)
pO2, Arterial: 93 mmHg (ref 83.0–108.0)
pO2, Arterial: 299 mmHg — ABNORMAL HIGH (ref 83.0–108.0)

## 2017-03-21 LAB — CBC
HCT: 36.4 % — ABNORMAL LOW (ref 39.0–52.0)
HCT: 33 % — ABNORMAL LOW (ref 39.0–52.0)
Hemoglobin: 12.2 g/dL — ABNORMAL LOW (ref 13.0–17.0)
Hemoglobin: 11.3 g/dL — ABNORMAL LOW (ref 13.0–17.0)
MCH: 30.7 pg (ref 26.0–34.0)
MCH: 30.2 pg (ref 26.0–34.0)
MCHC: 33.5 g/dL (ref 30.0–36.0)
MCHC: 34.2 g/dL (ref 30.0–36.0)
MCV: 91.7 fL (ref 78.0–100.0)
MCV: 88.2 fL (ref 78.0–100.0)
PLATELETS: 557 10*3/uL — AB (ref 150–400)
Platelets: 465 10*3/uL — ABNORMAL HIGH (ref 150–400)
RBC: 3.97 MIL/uL — AB (ref 4.22–5.81)
RBC: 3.74 MIL/uL — ABNORMAL LOW (ref 4.22–5.81)
RDW: 13.5 % (ref 11.5–15.5)
RDW: 13.7 % (ref 11.5–15.5)
WBC: 8.1 10*3/uL (ref 4.0–10.5)
WBC: 17 10*3/uL — ABNORMAL HIGH (ref 4.0–10.5)

## 2017-03-21 LAB — BASIC METABOLIC PANEL
Anion gap: 14 (ref 5–15)
BUN: 18 mg/dL (ref 6–20)
CALCIUM: 8.3 mg/dL — AB (ref 8.9–10.3)
CO2: 19 mmol/L — ABNORMAL LOW (ref 22–32)
CREATININE: 1.43 mg/dL — AB (ref 0.61–1.24)
Chloride: 98 mmol/L — ABNORMAL LOW (ref 101–111)
GFR calc Af Amer: 53 mL/min — ABNORMAL LOW (ref >60)
GFR, EST NON AFRICAN AMERICAN: 46 mL/min — AB (ref >60)
GLUCOSE: 260 mg/dL — AB (ref 65–99)
Potassium: 4.1 mmol/L (ref 3.5–5.1)
Sodium: 131 mmol/L — ABNORMAL LOW (ref 135–145)

## 2017-03-21 LAB — GLUCOSE, CAPILLARY: Glucose-Capillary: 229 mg/dL — ABNORMAL HIGH (ref 65–99)

## 2017-03-21 LAB — PREPARE RBC (CROSSMATCH)

## 2017-03-21 SURGERY — VIDEO ASSISTED THORACOSCOPY (VATS)/WEDGE RESECTION
Anesthesia: General | Site: Chest | Laterality: Right

## 2017-03-21 MED ORDER — DIPHENHYDRAMINE HCL 12.5 MG/5ML PO ELIX
12.5000 mg | ORAL_SOLUTION | Freq: Four times a day (QID) | ORAL | Status: DC | PRN
  Filled 2017-03-21: qty 5

## 2017-03-21 MED ORDER — HYDROMORPHONE HCL 1 MG/ML IJ SOLN: 0.2500 mg | INTRAMUSCULAR | Status: DC | PRN

## 2017-03-21 MED ORDER — MEPERIDINE HCL 50 MG/ML IJ SOLN: 6.2500 mg | INTRAMUSCULAR | Status: DC | PRN

## 2017-03-21 MED ORDER — ENOXAPARIN SODIUM 40 MG/0.4ML ~~LOC~~ SOLN
40.0000 mg | Freq: Every day | SUBCUTANEOUS | Status: DC
  Administered 2017-03-22 – 2017-03-25 (×4): 40 mg via SUBCUTANEOUS
  Filled 2017-03-21 (×4): qty 0.4

## 2017-03-21 MED ORDER — CEFAZOLIN SODIUM-DEXTROSE 2-4 GM/100ML-% IV SOLN
2.0000 g | INTRAVENOUS | Status: AC
  Administered 2017-03-21: 2 g via INTRAVENOUS
  Filled 2017-03-21: qty 100

## 2017-03-21 MED ORDER — 0.9 % SODIUM CHLORIDE (POUR BTL) OPTIME
TOPICAL | Status: DC | PRN
  Administered 2017-03-21: 1000 mL

## 2017-03-21 MED ORDER — MIDAZOLAM HCL 2 MG/2ML IJ SOLN
INTRAMUSCULAR | Status: AC
  Administered 2017-03-21: 2 mg via INTRAVENOUS
  Filled 2017-03-21: qty 2

## 2017-03-21 MED ORDER — GABAPENTIN 300 MG PO CAPS
300.0000 mg | ORAL_CAPSULE | Freq: Once | ORAL | Status: AC
  Administered 2017-03-21: 300 mg via ORAL
  Filled 2017-03-21: qty 1

## 2017-03-21 MED ORDER — FENTANYL CITRATE (PF) 250 MCG/5ML IJ SOLN
INTRAMUSCULAR | Status: AC
  Filled 2017-03-21: qty 5

## 2017-03-21 MED ORDER — ALBUMIN HUMAN 5 % IV SOLN
INTRAVENOUS | Status: DC | PRN
  Administered 2017-03-21 (×2): via INTRAVENOUS

## 2017-03-21 MED ORDER — SUGAMMADEX SODIUM 200 MG/2ML IV SOLN
INTRAVENOUS | Status: AC
  Filled 2017-03-21: qty 2

## 2017-03-21 MED ORDER — ROCURONIUM BROMIDE 10 MG/ML (PF) SYRINGE
PREFILLED_SYRINGE | INTRAVENOUS | Status: AC
  Filled 2017-03-21: qty 5

## 2017-03-21 MED ORDER — SODIUM CHLORIDE 0.9 % IV SOLN
INTRAVENOUS | Status: DC | PRN
  Administered 2017-03-21: 17:00:00 via INTRAVENOUS

## 2017-03-21 MED ORDER — BUPIVACAINE LIPOSOME 1.3 % IJ SUSP
20.0000 mL | INTRAMUSCULAR | Status: AC
  Administered 2017-03-21: 15 mL
  Filled 2017-03-21: qty 20

## 2017-03-21 MED ORDER — GABAPENTIN 300 MG PO CAPS
300.0000 mg | ORAL_CAPSULE | Freq: Two times a day (BID) | ORAL | Status: AC
Start: 2017-03-22 — End: 2017-03-22
  Administered 2017-03-22 (×2): 300 mg via ORAL
  Filled 2017-03-21 (×2): qty 1

## 2017-03-21 MED ORDER — PROPOFOL 10 MG/ML IV BOLUS
INTRAVENOUS | Status: AC
  Filled 2017-03-21: qty 20

## 2017-03-21 MED ORDER — SENNOSIDES-DOCUSATE SODIUM 8.6-50 MG PO TABS
1.0000 | ORAL_TABLET | Freq: Every day | ORAL | Status: DC
  Administered 2017-03-21 – 2017-03-24 (×3): 1 via ORAL
  Filled 2017-03-21 (×4): qty 1

## 2017-03-21 MED ORDER — PROPOFOL 10 MG/ML IV BOLUS
INTRAVENOUS | Status: DC | PRN
  Administered 2017-03-21: 100 mg via INTRAVENOUS

## 2017-03-21 MED ORDER — ROCURONIUM BROMIDE 100 MG/10ML IV SOLN
INTRAVENOUS | Status: DC | PRN
  Administered 2017-03-21: 10 mg via INTRAVENOUS
  Administered 2017-03-21: 50 mg via INTRAVENOUS

## 2017-03-21 MED ORDER — ASPIRIN EC 81 MG PO TBEC
81.0000 mg | DELAYED_RELEASE_TABLET | Freq: Every day | ORAL | Status: DC
  Administered 2017-03-22 – 2017-03-26 (×5): 81 mg via ORAL
  Filled 2017-03-21 (×5): qty 1

## 2017-03-21 MED ORDER — DEXAMETHASONE SODIUM PHOSPHATE 10 MG/ML IJ SOLN
INTRAMUSCULAR | Status: AC
  Filled 2017-03-21: qty 1

## 2017-03-21 MED ORDER — FENTANYL CITRATE (PF) 100 MCG/2ML IJ SOLN: 25.0000 ug | INTRAMUSCULAR | Status: DC | PRN

## 2017-03-21 MED ORDER — ONDANSETRON HCL 4 MG/2ML IJ SOLN: 4.0000 mg | Freq: Four times a day (QID) | INTRAMUSCULAR | Status: DC | PRN

## 2017-03-21 MED ORDER — BISACODYL 5 MG PO TBEC
10.0000 mg | DELAYED_RELEASE_TABLET | Freq: Every day | ORAL | Status: DC
  Administered 2017-03-25: 10 mg via ORAL
  Filled 2017-03-21 (×7): qty 2

## 2017-03-21 MED ORDER — ONDANSETRON HCL 4 MG/2ML IJ SOLN
INTRAMUSCULAR | Status: DC | PRN
  Administered 2017-03-21: 4 mg via INTRAVENOUS

## 2017-03-21 MED ORDER — DEXAMETHASONE SODIUM PHOSPHATE 10 MG/ML IJ SOLN
INTRAMUSCULAR | Status: DC | PRN
  Administered 2017-03-21: 10 mg via INTRAVENOUS

## 2017-03-21 MED ORDER — FENTANYL CITRATE (PF) 100 MCG/2ML IJ SOLN
INTRAMUSCULAR | Status: DC | PRN
  Administered 2017-03-21: 50 ug via INTRAVENOUS
  Administered 2017-03-21: 150 ug via INTRAVENOUS
  Administered 2017-03-21 (×4): 50 ug via INTRAVENOUS

## 2017-03-21 MED ORDER — ONDANSETRON HCL 4 MG/2ML IJ SOLN
INTRAMUSCULAR | Status: AC
  Filled 2017-03-21: qty 2

## 2017-03-21 MED ORDER — LACTATED RINGERS IV SOLN
INTRAVENOUS | Status: DC
  Administered 2017-03-21 (×3): via INTRAVENOUS

## 2017-03-21 MED ORDER — FENTANYL CITRATE (PF) 100 MCG/2ML IJ SOLN
INTRAMUSCULAR | Status: AC
  Administered 2017-03-21: 100 ug via INTRAVENOUS
  Filled 2017-03-21: qty 2

## 2017-03-21 MED ORDER — DEXTROSE-NACL 5-0.45 % IV SOLN
INTRAVENOUS | Status: DC
  Administered 2017-03-21: 21:00:00 via INTRAVENOUS

## 2017-03-21 MED ORDER — ACETAMINOPHEN 160 MG/5ML PO SOLN: 1000.0000 mg | Freq: Four times a day (QID) | ORAL | Status: DC

## 2017-03-21 MED ORDER — INSULIN ASPART 100 UNIT/ML ~~LOC~~ SOLN
0.0000 [IU] | SUBCUTANEOUS | Status: DC
  Administered 2017-03-21: 8 [IU] via SUBCUTANEOUS
  Administered 2017-03-21: 4 [IU] via SUBCUTANEOUS
  Administered 2017-03-22: 2 [IU] via SUBCUTANEOUS

## 2017-03-21 MED ORDER — ACETAMINOPHEN 500 MG PO TABS
1000.0000 mg | ORAL_TABLET | Freq: Four times a day (QID) | ORAL | Status: DC
  Administered 2017-03-21 – 2017-03-25 (×12): 1000 mg via ORAL
  Filled 2017-03-21 (×12): qty 2

## 2017-03-21 MED ORDER — SODIUM CHLORIDE 0.9 % IJ SOLN
INTRAMUSCULAR | Status: DC | PRN
  Administered 2017-03-21: 50 mL

## 2017-03-21 MED ORDER — ONDANSETRON HCL 4 MG/2ML IJ SOLN: 4.0000 mg | Freq: Once | INTRAMUSCULAR | Status: DC | PRN

## 2017-03-21 MED ORDER — LIDOCAINE HCL (CARDIAC) 20 MG/ML IV SOLN
INTRAVENOUS | Status: DC | PRN
  Administered 2017-03-21: 100 mg via INTRAVENOUS

## 2017-03-21 MED ORDER — FENTANYL 40 MCG/ML IV SOLN
INTRAVENOUS | Status: DC
  Administered 2017-03-21: 1000 ug via INTRAVENOUS
  Administered 2017-03-22: 0 ug via INTRAVENOUS
  Administered 2017-03-22: 45 ug via INTRAVENOUS
  Administered 2017-03-22: 15 ug via INTRAVENOUS
  Administered 2017-03-22: 30 ug via INTRAVENOUS
  Administered 2017-03-22: 0 ug via INTRAVENOUS
  Administered 2017-03-23: 45 ug via INTRAVENOUS
  Administered 2017-03-23: 30 ug via INTRAVENOUS
  Administered 2017-03-23: 15 ug via INTRAVENOUS
  Administered 2017-03-24: 0 ug via INTRAVENOUS
  Administered 2017-03-24: 15 ug via INTRAVENOUS
  Administered 2017-03-24: 60 ug via INTRAVENOUS
  Administered 2017-03-24: 30 ug via INTRAVENOUS
  Administered 2017-03-24 – 2017-03-25 (×2): 15 ug via INTRAVENOUS
  Administered 2017-03-25: 30 ug via INTRAVENOUS
  Administered 2017-03-25: 15 ug via INTRAVENOUS
  Administered 2017-03-25: 60 ug via INTRAVENOUS
  Administered 2017-03-25: 15 ug via INTRAVENOUS
  Filled 2017-03-21: qty 25

## 2017-03-21 MED ORDER — TRAMADOL HCL 50 MG PO TABS
50.0000 mg | ORAL_TABLET | Freq: Four times a day (QID) | ORAL | Status: DC | PRN
  Administered 2017-03-23: 100 mg via ORAL
  Filled 2017-03-21: qty 2

## 2017-03-21 MED ORDER — SUGAMMADEX SODIUM 200 MG/2ML IV SOLN
INTRAVENOUS | Status: DC | PRN
  Administered 2017-03-21: 150 mg via INTRAVENOUS

## 2017-03-21 MED ORDER — DIPHENHYDRAMINE HCL 50 MG/ML IJ SOLN: 12.5000 mg | Freq: Four times a day (QID) | INTRAMUSCULAR | Status: DC | PRN

## 2017-03-21 MED ORDER — HEMOSTATIC AGENTS (NO CHARGE) OPTIME
TOPICAL | Status: DC | PRN
  Administered 2017-03-21: 1 via TOPICAL

## 2017-03-21 MED ORDER — NALOXONE HCL 0.4 MG/ML IJ SOLN: 0.4000 mg | INTRAMUSCULAR | Status: DC | PRN

## 2017-03-21 MED ORDER — MIDAZOLAM HCL 2 MG/2ML IJ SOLN
2.0000 mg | Freq: Once | INTRAMUSCULAR | Status: AC
  Administered 2017-03-21: 2 mg via INTRAVENOUS

## 2017-03-21 MED ORDER — OXYCODONE HCL 5 MG PO TABS
5.0000 mg | ORAL_TABLET | ORAL | Status: DC | PRN
  Administered 2017-03-23: 5 mg via ORAL
  Administered 2017-03-23 – 2017-03-25 (×2): 10 mg via ORAL
  Filled 2017-03-21 (×3): qty 2

## 2017-03-21 MED ORDER — SODIUM CHLORIDE 0.9% FLUSH: 9.0000 mL | INTRAVENOUS | Status: DC | PRN

## 2017-03-21 MED ORDER — CEFAZOLIN SODIUM-DEXTROSE 2-4 GM/100ML-% IV SOLN
2.0000 g | Freq: Three times a day (TID) | INTRAVENOUS | Status: AC
  Administered 2017-03-21 – 2017-03-22 (×2): 2 g via INTRAVENOUS
  Filled 2017-03-21 (×2): qty 100

## 2017-03-21 MED ORDER — FENTANYL CITRATE (PF) 100 MCG/2ML IJ SOLN
100.0000 ug | Freq: Once | INTRAMUSCULAR | Status: AC
  Administered 2017-03-21: 100 ug via INTRAVENOUS

## 2017-03-21 MED ORDER — GABAPENTIN 300 MG PO CAPS
300.0000 mg | ORAL_CAPSULE | Freq: Three times a day (TID) | ORAL | Status: DC
  Administered 2017-03-23 – 2017-03-26 (×10): 300 mg via ORAL
  Filled 2017-03-21 (×10): qty 1

## 2017-03-21 MED ORDER — PHENYLEPHRINE HCL 10 MG/ML IJ SOLN
INTRAVENOUS | Status: DC | PRN
  Administered 2017-03-21: 10 ug/min via INTRAVENOUS

## 2017-03-21 MED ORDER — POTASSIUM CHLORIDE 10 MEQ/50ML IV SOLN
10.0000 meq | Freq: Every day | INTRAVENOUS | Status: AC | PRN
  Administered 2017-03-23 (×3): 10 meq via INTRAVENOUS
  Filled 2017-03-21 (×3): qty 50

## 2017-03-21 MED ORDER — SODIUM CHLORIDE 0.9 % IV SOLN: Freq: Once | INTRAVENOUS | Status: DC

## 2017-03-21 SURGICAL SUPPLY — 95 items
APPLICATOR COTTON TIP 6IN STRL (MISCELLANEOUS) ×3 IMPLANT
APPLIER CLIP ROT 10 11.4 M/L (STAPLE) ×3
BENZOIN TINCTURE PRP APPL 2/3 (GAUZE/BANDAGES/DRESSINGS) ×3 IMPLANT
CANISTER SUCT 3000ML PPV (MISCELLANEOUS) ×6 IMPLANT
CATH THORACIC 28FR (CATHETERS) IMPLANT
CATH THORACIC 36FR (CATHETERS) IMPLANT
CATH THORACIC 36FR RT ANG (CATHETERS) IMPLANT
CLIP APPLIE ROT 10 11.4 M/L (STAPLE) ×1 IMPLANT
CLIP VESOCCLUDE MED 6/CT (CLIP) ×3 IMPLANT
CONN ST 1/4X3/8  BEN (MISCELLANEOUS)
CONN ST 1/4X3/8 BEN (MISCELLANEOUS) IMPLANT
CONN Y 3/8X3/8X3/8  BEN (MISCELLANEOUS)
CONN Y 3/8X3/8X3/8 BEN (MISCELLANEOUS) IMPLANT
CONT SPEC 4OZ CLIKSEAL STRL BL (MISCELLANEOUS) ×15 IMPLANT
COVER SURGICAL LIGHT HANDLE (MISCELLANEOUS) ×3 IMPLANT
DERMABOND ADHESIVE PROPEN (GAUZE/BANDAGES/DRESSINGS) ×2
DERMABOND ADVANCED (GAUZE/BANDAGES/DRESSINGS) ×2
DERMABOND ADVANCED .7 DNX12 (GAUZE/BANDAGES/DRESSINGS) ×1 IMPLANT
DERMABOND ADVANCED .7 DNX6 (GAUZE/BANDAGES/DRESSINGS) ×1 IMPLANT
DRAIN CHANNEL 28F RND 3/8 FF (WOUND CARE) IMPLANT
DRAIN CHANNEL 32F RND 10.7 FF (WOUND CARE) IMPLANT
DRAPE C-ARM 35X43 STRL (DRAPES) ×3 IMPLANT
DRAPE LAPAROSCOPIC ABDOMINAL (DRAPES) ×3 IMPLANT
DRAPE WARM FLUID 44X44 (DRAPE) ×3 IMPLANT
DRAPE X-RAY CASS 24X20 (DRAPES) ×3 IMPLANT
ELECT BLADE 6.5 EXT (BLADE) ×3 IMPLANT
ELECT REM PT RETURN 9FT ADLT (ELECTROSURGICAL) ×3
ELECTRODE REM PT RTRN 9FT ADLT (ELECTROSURGICAL) ×1 IMPLANT
GAUZE SPONGE 4X4 12PLY STRL (GAUZE/BANDAGES/DRESSINGS) ×3 IMPLANT
GLOVE BIO SURGEON STRL SZ 6.5 (GLOVE) ×6 IMPLANT
GLOVE BIO SURGEONS STRL SZ 6.5 (GLOVE) ×3
GLOVE SURG SIGNA 7.5 PF LTX (GLOVE) ×9 IMPLANT
GOWN STRL REUS W/ TWL LRG LVL3 (GOWN DISPOSABLE) ×7 IMPLANT
GOWN STRL REUS W/ TWL XL LVL3 (GOWN DISPOSABLE) ×1 IMPLANT
GOWN STRL REUS W/TWL LRG LVL3 (GOWN DISPOSABLE) ×14
GOWN STRL REUS W/TWL XL LVL3 (GOWN DISPOSABLE) ×2
HEMOSTAT SURGICEL 2X14 (HEMOSTASIS) ×3 IMPLANT
KIT BASIN OR (CUSTOM PROCEDURE TRAY) ×3 IMPLANT
KIT ROOM TURNOVER OR (KITS) ×3 IMPLANT
KIT SUCTION CATH 14FR (SUCTIONS) ×3 IMPLANT
NEEDLE HYPO 25GX1X1/2 BEV (NEEDLE) ×3 IMPLANT
NEEDLE SPNL 18GX3.5 QUINCKE PK (NEEDLE) ×3 IMPLANT
NEEDLE SPNL 22GX3.5 QUINCKE BK (NEEDLE) ×3 IMPLANT
NS IRRIG 1000ML POUR BTL (IV SOLUTION) ×9 IMPLANT
PACK CHEST (CUSTOM PROCEDURE TRAY) ×3 IMPLANT
PAD ARMBOARD 7.5X6 YLW CONV (MISCELLANEOUS) ×6 IMPLANT
POUCH ENDO CATCH II 15MM (MISCELLANEOUS) ×3 IMPLANT
POUCH SPECIMEN RETRIEVAL 10MM (ENDOMECHANICALS) IMPLANT
RELOAD STAPLER 60MM BLK (STAPLE) ×1 IMPLANT
RELOAD STAPLER GOLD 60MM (STAPLE) ×10 IMPLANT
RELOAD STAPLER GREEN 60MM (STAPLE) ×4 IMPLANT
SCISSORS ENDO CVD 5DCS (MISCELLANEOUS) IMPLANT
SEALANT PROGEL (MISCELLANEOUS) IMPLANT
SEALANT SURG COSEAL 4ML (VASCULAR PRODUCTS) IMPLANT
SEALANT SURG COSEAL 8ML (VASCULAR PRODUCTS) IMPLANT
SHEARS HARMONIC HDI 20CM (ELECTROSURGICAL) IMPLANT
SOLUTION ANTI FOG 6CC (MISCELLANEOUS) ×3 IMPLANT
SPECIMEN JAR MEDIUM (MISCELLANEOUS) ×3 IMPLANT
SPONGE INTESTINAL PEANUT (DISPOSABLE) ×18 IMPLANT
SPONGE TONSIL 1 RF SGL (DISPOSABLE) ×3 IMPLANT
STAPLE ECHEON FLEX 60 POW ENDO (STAPLE) ×3 IMPLANT
STAPLE RELOAD 2.5MM WHITE (STAPLE) ×3 IMPLANT
STAPLER RELOAD 60MM BLK (STAPLE) ×3
STAPLER RELOAD GOLD 60MM (STAPLE) ×30
STAPLER RELOAD GREEN 60MM (STAPLE) ×12
STAPLER VASCULAR ECHELON 35 (CUTTER) ×3 IMPLANT
SUT PROLENE 4 0 RB 1 (SUTURE) ×2
SUT PROLENE 4-0 RB1 .5 CRCL 36 (SUTURE) ×1 IMPLANT
SUT SILK  1 MH (SUTURE) ×6
SUT SILK 1 MH (SUTURE) ×3 IMPLANT
SUT SILK 1 TIES 10X30 (SUTURE) ×3 IMPLANT
SUT SILK 2 0 SH (SUTURE) ×3 IMPLANT
SUT SILK 2 0SH CR/8 30 (SUTURE) IMPLANT
SUT SILK 3 0 SH 30 (SUTURE) ×3 IMPLANT
SUT SILK 3 0SH CR/8 30 (SUTURE) IMPLANT
SUT VIC AB 0 CTX 27 (SUTURE) IMPLANT
SUT VIC AB 1 CTX 27 (SUTURE) ×3 IMPLANT
SUT VIC AB 2-0 CT1 27 (SUTURE)
SUT VIC AB 2-0 CT1 TAPERPNT 27 (SUTURE) IMPLANT
SUT VIC AB 2-0 CTX 36 (SUTURE) ×6 IMPLANT
SUT VIC AB 3-0 MH 27 (SUTURE) IMPLANT
SUT VIC AB 3-0 SH 27 (SUTURE)
SUT VIC AB 3-0 SH 27X BRD (SUTURE) IMPLANT
SUT VIC AB 3-0 X1 27 (SUTURE) ×3 IMPLANT
SUT VICRYL 0 UR6 27IN ABS (SUTURE) IMPLANT
SUT VICRYL 2 TP 1 (SUTURE) IMPLANT
SYR 30ML LL (SYRINGE) ×3 IMPLANT
SYSTEM SAHARA CHEST DRAIN ATS (WOUND CARE) ×3 IMPLANT
TAPE CLOTH SURG 4X10 WHT LF (GAUZE/BANDAGES/DRESSINGS) ×3 IMPLANT
TIP APPLICATOR SPRAY EXTEND 16 (VASCULAR PRODUCTS) IMPLANT
TOWEL GREEN STERILE (TOWEL DISPOSABLE) ×3 IMPLANT
TOWEL GREEN STERILE FF (TOWEL DISPOSABLE) ×3 IMPLANT
TROCAR XCEL BLADELESS 5X75MML (TROCAR) ×3 IMPLANT
TROCAR XCEL NON-BLD 5MMX100MML (ENDOMECHANICALS) IMPLANT
WATER STERILE IRR 1000ML POUR (IV SOLUTION) ×6 IMPLANT

## 2017-03-21 NOTE — Transfer of Care (Signed)
Immediate Anesthesia Transfer of Care Note  Patient: Jerry Wise  Procedure(s) Performed: REDO VIDEO ASSISTED THORACOSCOPY (VATS)/WEDGE RESECTION (Right Chest)  Patient Location: PACU  Anesthesia Type:General  Level of Consciousness: awake, oriented, drowsy and patient cooperative  Airway & Oxygen Therapy: Patient Spontanous Breathing  Post-op Assessment: Report given to RN and Post -op Vital signs reviewed and stable  Post vital signs: Reviewed and stable  Last Vitals:  Vitals:   03/21/17 1841 03/21/17 1844  BP: 132/77   Pulse: 65   Resp:    Temp:  (!) 36.1 C  SpO2: 99%     Last Pain:  Vitals:   03/21/17 1844  TempSrc:   PainSc: Asleep      Patients Stated Pain Goal: 3 (87/19/59 7471)  Complications: No apparent anesthesia complications

## 2017-03-21 NOTE — Interval H&P Note (Signed)
History and Physical Interval Note:  03/21/2017 12:15 PM  Jerry Wise  has presented today for surgery, with the diagnosis of RUL NODULE  The various methods of treatment have been discussed with the patient and family. After consideration of risks, benefits and other options for treatment, the patient has consented to  Procedure(s): REDO VIDEO ASSISTED THORACOSCOPY (VATS)/WEDGE RESECTION (Right) as a surgical intervention .  The patient's history has been reviewed, patient examined, no change in status, stable for surgery.  I have reviewed the patient's chart and labs.  Questions were answered to the patient's satisfaction.     [name]

## 2017-03-21 NOTE — Anesthesia Postprocedure Evaluation (Signed)
Anesthesia Post Note  Patient: Jerry Wise  Procedure(s) Performed: REDO VIDEO ASSISTED THORACOSCOPY (VATS)/WEDGE RESECTION (Right Chest)     Patient location during evaluation: PACU Anesthesia Type: General Level of consciousness: awake and alert Pain management: pain level controlled Vital Signs Assessment: post-procedure vital signs reviewed and stable Respiratory status: spontaneous breathing, nonlabored ventilation, respiratory function stable and patient connected to nasal cannula oxygen Cardiovascular status: blood pressure returned to baseline and stable Postop Assessment: no apparent nausea or vomiting Anesthetic complications: no    Last Vitals:  Vitals:   03/21/17 2000 03/21/17 2015  BP:    Pulse: 64   Resp: 10   Temp:  36.5 C  SpO2: 95%     Last Pain:  Vitals:   03/21/17 2015  TempSrc:   PainSc: 0-No pain                 Woodrow Dulski COKER

## 2017-03-21 NOTE — Op Note (Signed)
NAMEPEGGY, Jerry Wise                 ACCOUNT NO.:  [id number]  MEDICAL RECORD NO.:  [id number]  LOCATION:                                 FACILITY:  PHYSICIAN:  Revonda Standard. Roxan Hockey, M.D. DATE OF BIRTH:  DATE OF PROCEDURE:  03/21/2017 DATE OF DISCHARGE:                              OPERATIVE REPORT   PREOPERATIVE DIAGNOSIS:  Squamous cell carcinoma, right upper lobe, clinical stage IIIA, pathologic stage IA.  POSTOPERATIVE DIAGNOSIS:  Squamous cell carcinoma, right upper lobe, clinical stage IIIA, pathologic stage IA.  PROCEDURE:  Redo right video-assisted thoracoscopy, takedown of adhesions, wedge resection to complete right upper lobectomy, intercostal nerve block with liposomal bupivacaine.  SURGEON:  Revonda Standard. Roxan Hockey, MD.  ASSISTANTS: 1. Nicholes Rough, PA. 2. Ellwood Handler, PA.  ANESTHESIA:  General.  FINDINGS:  Severe adhesions.  Fiducial markers noted on the x-ray of resected specimen.  Tumor seen on resected specimen frozen section.  No tumor at the margin.  CLINICAL NOTE:  Jerry Wise is a 77 year old gentleman who was found to have a right upper lobe nodule on low-dose CT screening.  The nodule was initially followed but grew overtime.  In January, a PET-CT showed metabolic uptake.  There was markedly hypermetabolic mediastinal and hilar adenopathy.  Bronchoscopy was positive for squamous cell carcinoma. Endobronchial ultrasound did not demonstrate any nodal metastases.  Mediastinoscopy was performed and the nodes were not malignant.  A right VATS was done for right upper lobectomy.  There were adhesions and incomplete fissures and it was a difficult dissection.  A nodule was present in the resected specimen, but it turned out not to be the tumor. He was advised to go back to the operating room to remove the remainder of the right upper lobe.  He refused initially, but now has reconsidered and consents to redo surgery.  He understands the indications, risks,  benefits, and alternatives.  OPERATIVE NOTE:  Jerry Wise was brought to the operating room on March 21, 2017.  He had induction of general anesthesia and was intubated with a double-lumen endotracheal tube.  Intravenous antibiotics were administered.  Sequential compression devices were placed on the calves for DVT prophylaxis.  A Foley catheter was placed.  He was placed in a left lateral decubitus position and the right chest was prepped and draped in usual sterile fashion.  Single lung ventilation of the left lung was initiated and it was tolerated well throughout the procedure.  Liposomal bupivacaine was infiltrated around the previous port sites and the previous incision.  The incision then was reopened as was the more anterior port site.  A 5-mm port was inserted and the thoracoscope was advanced into the chest.  There were extensive adhesions of the lower and middle lobe and the remnant of the upper lobe to the parietal pleura. These adhesions were taken down.  In some areas an extrapleural plane was developed because of difficulty developing the plane between the lung and the chest wall.  After taking down adhesions sufficient to see posteriorly, liposomal bupivacaine was infiltrated into the intercostal spaces from the 3rd to 9th space.  Approximately 5 mL of liposomal bupivacaine was placed into each of the  intercostal spaces.  The remainder of the adhesions then were taken down.  This took a long time, particularly anteriorly.  There were dense adhesions.  The lower lobe was not freed up off the diaphragm, but the middle lobe was and then the lung was freed up anteriorly.  The remnant of the upper lobe was particularly difficult to get down off the chest wall.  A wedge resection then was performed.  A small portion of the middle lobe was taken along with the upper lobe to ensure the entire upper lobe was included in the resection margin.  More posteriorly, the fissure  was opened and dissected out.  This was a slow and tedious process and part of the fissure was completed with stapling between the superior segment and the remnant of the upper lobe.  Multiple firings of the Echelon 60-mm powered stapler using black and green cartridges were performed to complete the staple line.  In the vicinity of the bronchus intermedius test inflations were done before each firing of the stapler to ensure that there was no compromise of the bronchus.  The specimen was placed into an endoscopic retrieval bag, removed.  An x-ray was taken of the specimen.  The fiducial markers were present.  The specimen was sent for pathology for frozen section of the nodule and the margin.  The chest was copiously irrigated with warm saline.  Blood was administered as he was anemic and there was some blood loss ongoing during the takedown of the adhesions.  He did remain hemodynamically stable throughout the procedure.  Frozen section showed the squamous cell carcinoma; the margin was negative.  A final inspection was made for hemostasis.  There was no ongoing bleeding.  A 28-French Blake drain was placed through the most posterior port incision and directed posteriorly and a 28-French chest tube was placed through the anterior port incision and directed anteriorly.  Both were secured to the skin with 0 silk sutures.  The lower and middle lobes were reinflated.  The middle lobe had previously been tacked to the lower lobe anteriorly and that remained intact.  The incision then was closed with a #1 Vicryl suture to close the serratus.  Subcutaneous tissue and skin were closed in a standard fashion.  The chest tubes were placed to suction.  The patient was extubated in the operating room and taken to the postanesthetic care unit in good condition.     Revonda Standard Roxan Hockey, M.D.     SCH/MEDQ  D:  03/21/2017  T:  03/21/2017  Job:  [id number]

## 2017-03-21 NOTE — Progress Notes (Signed)
eLink Physician-Brief Progress Note Patient Name: Jerry Wise DOB: 12-May-1940 MRN: [id number]   Date of Service  03/21/2017  HPI/Events of Note  77 yo male s/p redo right video-assisted thoracoscopy, takedown of adhesions, wedge resection to complete right upper lobectomy and  intercostal nerve block with liposomal bupivacaine. VSS. Patient being managed by Thoracic Surgery.   eICU Interventions  No new orders.      Intervention Category Evaluation Type: New Patient Evaluation  Lysle Dingwall 03/21/2017, 9:46 PM

## 2017-03-21 NOTE — Anesthesia Preprocedure Evaluation (Addendum)
Anesthesia Evaluation  Patient identified by MRN, date of birth, ID band Patient awake    Reviewed: Allergy & Precautions, NPO status , Patient's Chart, lab work & pertinent test results  Airway Mallampati: I  TM Distance: >3 FB Neck ROM: Full    Dental  (+) Upper Dentures, Dental Advisory Given, Lower Dentures   Pulmonary former smoker,    Pulmonary exam normal        Cardiovascular hypertension, Pt. on medications Normal cardiovascular exam     Neuro/Psych    GI/Hepatic GERD  Medicated and Controlled,  Endo/Other    Renal/GU Renal InsufficiencyRenal disease     Musculoskeletal   Abdominal   Peds  Hematology   Anesthesia Other Findings   Reproductive/Obstetrics                          Anesthesia Physical Anesthesia Plan  ASA: III  Anesthesia Plan: General   Post-op Pain Management:    Induction: Intravenous  PONV Risk Score and Plan: 2  Airway Management Planned: Double Lumen EBT  Additional Equipment: Arterial line, CVP and Ultrasound Guidance Line Placement  Intra-op Plan:   Post-operative Plan: Extubation in OR  Informed Consent: I have reviewed the patients History and Physical, chart, labs and discussed the procedure including the risks, benefits and alternatives for the proposed anesthesia with the patient or authorized representative who has indicated his/her understanding and acceptance.     Plan Discussed with: CRNA and Surgeon  Anesthesia Plan Comments:        Anesthesia Quick Evaluation

## 2017-03-21 NOTE — Anesthesia Procedure Notes (Signed)
Procedure Name: Intubation Date/Time: 03/21/2017 1:29 PM Performed by: Lowella Dell, CRNA Pre-anesthesia Checklist: Patient identified, Emergency Drugs available, Suction available and Patient being monitored Patient Re-evaluated:Patient Re-evaluated prior to induction Oxygen Delivery Method: Circle System Utilized Preoxygenation: Pre-oxygenation with 100% oxygen Induction Type: IV induction Ventilation: Mask ventilation without difficulty Laryngoscope Size: Mac and 4 Grade View: Grade I Endobronchial tube: Left, EBT position confirmed by fiberoptic bronchoscope, Double lumen EBT and EBT position confirmed by auscultation and 41 Fr Number of attempts: 1 Airway Equipment and Method: Stylet Placement Confirmation: ETT inserted through vocal cords under direct vision,  positive ETCO2 and breath sounds checked- equal and bilateral Secured at: 31 cm Tube secured with: Tape Dental Injury: Teeth and Oropharynx as per pre-operative assessment  Comments: VivaSight DL utilized

## 2017-03-21 NOTE — Anesthesia Procedure Notes (Signed)
Arterial Line Insertion Start/End3/18/2019 12:30 PM, 03/21/2017 12:40 PM Performed by: Lillia Abed, MD, anesthesiologist  Patient location: OR. Preanesthetic checklist: patient identified, IV checked, risks and benefits discussed, surgical consent, monitors and equipment checked, pre-op evaluation, timeout performed and anesthesia consent Lidocaine 1% used for infiltration and patient sedated Right, brachial was placed Catheter size: 20 G Hand hygiene performed  and Seldinger technique used  Attempts: 1 Procedure performed without using ultrasound guided technique. Following insertion, line sutured, dressing applied and Biopatch. Patient tolerated the procedure well with no immediate complications.

## 2017-03-21 NOTE — Anesthesia Procedure Notes (Signed)
Central Venous Catheter Insertion Performed by: Lillia Abed, MD, anesthesiologist Start/End3/18/2019 12:20 PM, 03/21/2017 12:30 PM Patient location: Pre-op. Preanesthetic checklist: patient identified, IV checked, risks and benefits discussed, surgical consent, monitors and equipment checked, pre-op evaluation, timeout performed and anesthesia consent Position: Trendelenburg Lidocaine 1% used for infiltration and patient sedated Hand hygiene performed  and maximum sterile barriers used  Catheter size: 8 Fr Total catheter length 16. Central line was placed.Double lumen Procedure performed using ultrasound guided technique. Ultrasound Notes:anatomy identified, needle tip was noted to be adjacent to the nerve/plexus identified, no ultrasound evidence of intravascular and/or intraneural injection and image(s) printed for medical record Attempts: 1 Following insertion, dressing applied, line sutured and Biopatch. Post procedure assessment: blood return through all ports  Patient tolerated the procedure well with no immediate complications.

## 2017-03-21 NOTE — Brief Op Note (Addendum)
03/21/2017  4:35 PM  PATIENT:  Jerry Wise  77 y.o. male  PRE-OPERATIVE DIAGNOSIS:  SQUAMOUS CELL CARCINOMA- CLINICAL STAGE IIIA/ PATHOLOGIC STAGE IA OF RIGHT UPPER LOBE  POST-OPERATIVE DIAGNOSIS:  SQUAMOUS CELL CARCINOMA- CLINICAL STAGE IIIA/ PATHOLOGIC STAGE IA OF RIGHT UPPER LOBE  PROCEDURE:  Procedure(s):  REDO VIDEO ASSISTED THORACOSCOPY (VATS) -Wedge Resection RUL (REMOVED REMAINDER OF RUL  Intercostal Nerve Block with Liposomal Bupivicaine  SURGEON:  Surgeon(s) and Role:    * [name], MD - Primary  PHYSICIAN ASSISTANT:  Nicholes Rough, PA-C Erin Barrett, PA-C   ANESTHESIA:   general  EBL:  500 mL   BLOOD ADMINISTERED:none  DRAINS: ROUTINE   LOCAL MEDICATIONS USED:  BUPIVICAINE   SPECIMEN:  Source of Specimen:  PORTION OF THE RIGHT UPPER LOBE  DISPOSITION OF SPECIMEN:  PATHOLOGY  COUNTS:  YES  DICTATION: .Dragon Dictation  PLAN OF CARE: Admit to inpatient   PATIENT DISPOSITION:  ICU - intubated and hemodynamically stable.   Delay start of Pharmacological VTE agent (>24hrs) due to surgical blood loss or risk of bleeding: no  FINDINGS: TUMOR IN RESECTED SPECIMEN. MARGIN NEGATIVE

## 2017-03-21 NOTE — Progress Notes (Signed)
Patient arrived on unit with an air leak in chest tube after being hooked up to suction and significant tidaling.  Patient has no respiratory distress, sating upper 90s on room air.  Chest tube dressings were changed.  According to PACU nurse the [place] was changed prior to his transfer to [place], and the chest tube had no air leak.    2240 chest tube reassessment shows significant air leak into the 6th and 7th chambers when patient is coughing.  He still appears to have no respiratory distress with O2 sats 99-100% on room air.  Chest  tube dressings are intact. RN will continue to monitor closely for further changes.

## 2017-03-22 ENCOUNTER — Encounter (HOSPITAL_COMMUNITY): Payer: Self-pay | Admitting: Thoracic Surgery (Cardiothoracic Vascular Surgery)

## 2017-03-22 ENCOUNTER — Other Ambulatory Visit: Payer: Self-pay

## 2017-03-22 ENCOUNTER — Ambulatory Visit: Payer: Medicare Other | Admitting: Thoracic Surgery (Cardiothoracic Vascular Surgery)

## 2017-03-22 ENCOUNTER — Inpatient Hospital Stay (HOSPITAL_COMMUNITY): Payer: Medicare Other

## 2017-03-22 LAB — GLUCOSE, CAPILLARY
GLUCOSE-CAPILLARY: 157 mg/dL — AB (ref 65–99)
GLUCOSE-CAPILLARY: 126 mg/dL — AB (ref 65–99)
GLUCOSE-CAPILLARY: 129 mg/dL — AB (ref 65–99)
Glucose-Capillary: 137 mg/dL — ABNORMAL HIGH (ref 65–99)
Glucose-Capillary: 190 mg/dL — ABNORMAL HIGH (ref 65–99)
Glucose-Capillary: 191 mg/dL — ABNORMAL HIGH (ref 65–99)

## 2017-03-22 LAB — BASIC METABOLIC PANEL
Anion gap: 11 (ref 5–15)
BUN: 18 mg/dL (ref 6–20)
CO2: 20 mmol/L — ABNORMAL LOW (ref 22–32)
CREATININE: 1.48 mg/dL — AB (ref 0.61–1.24)
Calcium: 8.2 mg/dL — ABNORMAL LOW (ref 8.9–10.3)
Chloride: 100 mmol/L — ABNORMAL LOW (ref 101–111)
GFR calc Af Amer: 51 mL/min — ABNORMAL LOW (ref >60)
GFR, EST NON AFRICAN AMERICAN: 44 mL/min — AB (ref >60)
GLUCOSE: 143 mg/dL — AB (ref 65–99)
POTASSIUM: 3.9 mmol/L (ref 3.5–5.1)
Sodium: 131 mmol/L — ABNORMAL LOW (ref 135–145)

## 2017-03-22 LAB — CBC
HCT: 28 % — ABNORMAL LOW (ref 39.0–52.0)
Hemoglobin: 9.5 g/dL — ABNORMAL LOW (ref 13.0–17.0)
MCH: 29.7 pg (ref 26.0–34.0)
MCHC: 33.9 g/dL (ref 30.0–36.0)
MCV: 87.5 fL (ref 78.0–100.0)
PLATELETS: 439 10*3/uL — AB (ref 150–400)
RBC: 3.2 MIL/uL — AB (ref 4.22–5.81)
RDW: 13.9 % (ref 11.5–15.5)
WBC: 12.7 10*3/uL — ABNORMAL HIGH (ref 4.0–10.5)

## 2017-03-22 LAB — POCT I-STAT 3, ART BLOOD GAS (G3+)
Acid-base deficit: 4 mmol/L — ABNORMAL HIGH (ref 0.0–2.0)
Acid-base deficit: 6 mmol/L — ABNORMAL HIGH (ref 0.0–2.0)
BICARBONATE: 18.6 mmol/L — AB (ref 20.0–28.0)
Bicarbonate: 20.8 mmol/L (ref 20.0–28.0)
O2 Saturation: 95 %
O2 Saturation: 96 %
PCO2 ART: 34.4 mmHg (ref 32.0–48.0)
PH ART: 7.391 (ref 7.350–7.450)
PO2 ART: 77 mmHg — AB (ref 83.0–108.0)
PO2 ART: 77 mmHg — AB (ref 83.0–108.0)
Patient temperature: 98.1
TCO2: 22 mmol/L (ref 22–32)
TCO2: 20 mmol/L — ABNORMAL LOW (ref 22–32)
pCO2 arterial: 30.6 mmHg — ABNORMAL LOW (ref 32.0–48.0)
pH, Arterial: 7.389 (ref 7.350–7.450)

## 2017-03-22 MED ORDER — ATENOLOL 100 MG PO TABS
100.0000 mg | ORAL_TABLET | Freq: Every day | ORAL | Status: DC
  Administered 2017-03-22 – 2017-03-26 (×5): 100 mg via ORAL
  Filled 2017-03-22 (×2): qty 1
  Filled 2017-03-22: qty 4
  Filled 2017-03-22 (×2): qty 1

## 2017-03-22 MED ORDER — ALLOPURINOL 300 MG PO TABS
300.0000 mg | ORAL_TABLET | Freq: Every day | ORAL | Status: DC
  Administered 2017-03-22 – 2017-03-26 (×5): 300 mg via ORAL
  Filled 2017-03-22 (×5): qty 1

## 2017-03-22 MED ORDER — INSULIN ASPART 100 UNIT/ML ~~LOC~~ SOLN
0.0000 [IU] | Freq: Three times a day (TID) | SUBCUTANEOUS | Status: DC
  Administered 2017-03-22: 3 [IU] via SUBCUTANEOUS
  Administered 2017-03-22 (×2): 2 [IU] via SUBCUTANEOUS

## 2017-03-22 NOTE — Progress Notes (Signed)
1 Day Post-Op Procedure(s) (LRB): REDO VIDEO ASSISTED THORACOSCOPY (VATS)/WEDGE RESECTION (Right) Subjective: No complaints, denies nausea, "hurts less than it did before"  Objective: Vital signs in last 24 hours: Temp:  [97 F (36.1 C)-98.3 F (36.8 C)] 97.9 F (36.6 C) (03/19 0731) Pulse Rate:  [63-74] 64 (03/18 2000) Cardiac Rhythm: Normal sinus rhythm (03/19 0400) Resp:  [10-22] 17 (03/19 0600) BP: (118-162)/(72-94) 118/94 (03/19 0731) SpO2:  [94 %-100 %] 99 % (03/19 0600) Arterial Line BP: (116-153)/(52-86) 152/65 (03/19 0600) Weight:  [170 lb (77.1 kg)-171 lb 1.2 oz (77.6 kg)] 171 lb 1.2 oz (77.6 kg) (03/19 0500)  Hemodynamic parameters for last 24 hours:    Intake/Output from previous day: 03/18 0701 - 03/19 0700 In: 6487.8 [P.O.:720; I.V.:4437.8; Blood:630; IV FHQRFXJOI:325] Out: 2525 [Urine:705; Blood:1350; Chest Tube:470] Intake/Output this shift: No intake/output data recorded.  General appearance: alert, cooperative and no distress Neurologic: intact Heart: regular rate and rhythm Lungs: chest tube sounds on right Abdomen: normal findings: soft, non-tender moderate air leak  Lab Results: Recent Labs    03/21/17 2130 03/22/17 0405  WBC 17.0* 12.7*  HGB 11.3* 9.5*  HCT 33.0* 28.0*  PLT 465* 439*   BMET:  Recent Labs    03/21/17 2130 03/22/17 0405  NA 131* 131*  K 4.1 3.9  CL 98* 100*  CO2 19* 20*  GLUCOSE 260* 143*  BUN 18 18  CREATININE 1.43* 1.48*  CALCIUM 8.3* 8.2*    PT/INR: No results for input(s): LABPROT, INR in the last 72 hours. ABG    Component Value Date/Time   PHART 7.391 03/22/2017 0656   HCO3 18.6 (L) 03/22/2017 0656   TCO2 20 (L) 03/22/2017 0656   ACIDBASEDEF 6.0 (H) 03/22/2017 0656   O2SAT 96.0 03/22/2017 0656   CBG (last 3)  Recent Labs    03/21/17 2354 03/22/17 0411 03/22/17 0733  GLUCAP 190* 157* 191*    Assessment/Plan: S/P Procedure(s) (LRB): REDO VIDEO ASSISTED THORACOSCOPY (VATS)/WEDGE RESECTION  (Right) Plan for transfer to step-down: see transfer orders  Doing well POD # 1 CV- stable- dc A line  RESP- IS  Keep both CT to suction today  RENAL- creatinine below baseline  Has indwelling Foley  ENDO- CBG elevated with D5 1/2NS- decrease to KVo  CBG/SSI AC and HS  Anemia secondary to ABL- transfused in OR yesterday, Hct 28 this AM  SCD + enoxaparin for DVT prophylaxis  Ambulate    LOS: 1 day    [name] 03/22/2017

## 2017-03-23 ENCOUNTER — Inpatient Hospital Stay (HOSPITAL_COMMUNITY): Payer: Medicare Other

## 2017-03-23 LAB — CBC
HEMATOCRIT: 25.3 % — AB (ref 39.0–52.0)
Hemoglobin: 8.4 g/dL — ABNORMAL LOW (ref 13.0–17.0)
MCH: 29.8 pg (ref 26.0–34.0)
MCHC: 33.2 g/dL (ref 30.0–36.0)
MCV: 89.7 fL (ref 78.0–100.0)
Platelets: 348 10*3/uL (ref 150–400)
RBC: 2.82 MIL/uL — ABNORMAL LOW (ref 4.22–5.81)
RDW: 14.2 % (ref 11.5–15.5)
WBC: 13.8 10*3/uL — ABNORMAL HIGH (ref 4.0–10.5)

## 2017-03-23 LAB — COMPREHENSIVE METABOLIC PANEL
ALBUMIN: 2.7 g/dL — AB (ref 3.5–5.0)
ALK PHOS: 70 U/L (ref 38–126)
ALT: 6 U/L — ABNORMAL LOW (ref 17–63)
ANION GAP: 9 (ref 5–15)
AST: 14 U/L — AB (ref 15–41)
BUN: 19 mg/dL (ref 6–20)
CALCIUM: 7.8 mg/dL — AB (ref 8.9–10.3)
CO2: 23 mmol/L (ref 22–32)
Chloride: 103 mmol/L (ref 101–111)
Creatinine, Ser: 1.61 mg/dL — ABNORMAL HIGH (ref 0.61–1.24)
GFR calc Af Amer: 46 mL/min — ABNORMAL LOW (ref >60)
GFR calc non Af Amer: 40 mL/min — ABNORMAL LOW (ref >60)
GLUCOSE: 113 mg/dL — AB (ref 65–99)
Potassium: 3.3 mmol/L — ABNORMAL LOW (ref 3.5–5.1)
Sodium: 135 mmol/L (ref 135–145)
TOTAL PROTEIN: 5.2 g/dL — AB (ref 6.5–8.1)
Total Bilirubin: 0.7 mg/dL (ref 0.3–1.2)

## 2017-03-23 LAB — GLUCOSE, CAPILLARY
Glucose-Capillary: 101 mg/dL — ABNORMAL HIGH (ref 65–99)
Glucose-Capillary: 118 mg/dL — ABNORMAL HIGH (ref 65–99)
Glucose-Capillary: 117 mg/dL — ABNORMAL HIGH (ref 65–99)
Glucose-Capillary: 120 mg/dL — ABNORMAL HIGH (ref 65–99)

## 2017-03-23 MED ORDER — SODIUM CHLORIDE 0.9% FLUSH: 10.0000 mL | INTRAVENOUS | Status: DC | PRN

## 2017-03-23 MED ORDER — SODIUM CHLORIDE 0.9% FLUSH
10.0000 mL | Freq: Two times a day (BID) | INTRAVENOUS | Status: DC
  Administered 2017-03-24: 10 mL

## 2017-03-23 MED ORDER — POTASSIUM CHLORIDE 10 MEQ/50ML IV SOLN: 10.0000 meq | INTRAVENOUS | Status: AC

## 2017-03-23 NOTE — Progress Notes (Addendum)
2 Days Post-Op Procedure(s) (LRB): REDO VIDEO ASSISTED THORACOSCOPY (VATS)/WEDGE RESECTION (Right) Subjective: A little sore in my chest  Objective: Vital signs in last 24 hours: Temp:  [97.1 F (36.2 C)-98.7 F (37.1 C)] 98 F (36.7 C) (03/20 0734) Pulse Rate:  [74-82] 76 (03/20 0734) Cardiac Rhythm: Normal sinus rhythm (03/20 0734) Resp:  [12-23] 14 (03/20 0734) BP: (123-156)/(69-83) 125/69 (03/20 0734) SpO2:  [98 %-100 %] 98 % (03/20 0734)  Hemodynamic parameters for last 24 hours:    Intake/Output from previous day: 03/19 0701 - 03/20 0700 In: 1222.8 [P.O.:720; I.V.:432.8; IV Piggyback:50] Out: 8828 [Urine:1350; Chest Tube:300] Intake/Output this shift: Total I/O In: 240 [P.O.:240] Out: -   General appearance: alert, cooperative and no distress Neurologic: intact Heart: regular rate and rhythm Lungs: clear to auscultation bilaterally Abdomen: normal findings: soft, non-tender small air leak  Lab Results: Recent Labs    03/22/17 0405 03/23/17 0400  WBC 12.7* 13.8*  HGB 9.5* 8.4*  HCT 28.0* 25.3*  PLT 439* 348   BMET:  Recent Labs    03/22/17 0405 03/23/17 0400  NA 131* 135  K 3.9 3.3*  CL 100* 103  CO2 20* 23  GLUCOSE 143* 113*  BUN 18 19  CREATININE 1.48* 1.61*  CALCIUM 8.2* 7.8*    PT/INR: No results for input(s): LABPROT, INR in the last 72 hours. ABG    Component Value Date/Time   PHART 7.391 03/22/2017 0656   HCO3 18.6 (L) 03/22/2017 0656   TCO2 20 (L) 03/22/2017 0656   ACIDBASEDEF 6.0 (H) 03/22/2017 0656   O2SAT 96.0 03/22/2017 0656   CBG (last 3)  Recent Labs    03/22/17 1717 03/22/17 2106 03/23/17 0753  GLUCAP 126* 129* 101*    Assessment/Plan: S/P Procedure(s) (LRB): REDO VIDEO ASSISTED THORACOSCOPY (VATS)/WEDGE RESECTION (Right) -Doing well  Pain well controlled Path still pending Air leak is decreased from yesterday Dc posterior tube Increase ambulation Hypokalemia- supplement Stage 3 CKD, creatinine up a little  today at 1.61, still below recent baseline   LOS: 2 days    [name] 03/23/2017

## 2017-03-23 NOTE — Progress Notes (Signed)
Offered to help pt ambulate today post chest tube removal, pt states "not now, maybe later my side hurts a bit now" educated on importance of ambulation post-op. Will continue to monitor and attempt to ambulate later this evening.

## 2017-03-23 NOTE — Progress Notes (Signed)
Posterior chest tube removed per Dr. Leonarda Salon note and order. Pt tolerated procedure well. Per order remain chest left to suction. Pt tolerated procedure well. Will continue to monitor.

## 2017-03-24 ENCOUNTER — Inpatient Hospital Stay (HOSPITAL_COMMUNITY): Payer: Medicare Other

## 2017-03-24 ENCOUNTER — Other Ambulatory Visit: Payer: Self-pay | Admitting: *Deleted

## 2017-03-24 LAB — GLUCOSE, CAPILLARY
GLUCOSE-CAPILLARY: 230 mg/dL — AB (ref 65–99)
Glucose-Capillary: 98 mg/dL (ref 65–99)
Glucose-Capillary: 131 mg/dL — ABNORMAL HIGH (ref 65–99)
Glucose-Capillary: 124 mg/dL — ABNORMAL HIGH (ref 65–99)

## 2017-03-24 NOTE — Progress Notes (Signed)
Physician notified: Jadene Pierini, PA  At: [phone] in person on unit  Regarding: Crepitus noted on right anterior chest ascending up to right lateral neck. Posterior to central line. Doesn't not reach jaw.

## 2017-03-24 NOTE — Progress Notes (Signed)
Pt R IJ D/C per MD order, pt tol well, pt verbalized understanding of lying flat for 30 mins, call bell within reach, Dsg CDI, all questions answered, nursing will cont to monitor

## 2017-03-24 NOTE — Care Management Note (Signed)
Case Management Note  Patient Details  Name: Jerry Wise MRN: [id number] Date of Birth: 26-Nov-1940  Subjective/Objective:    Pt is s/p VATS                Action/Plan:   PTA independent from home with wife.  Pt has PCP and denied barriers to obtaining/paying for medications.     Expected Discharge Date:                  Expected Discharge Plan:  Home/Self Care  In-House Referral:     Discharge planning Services  CM Consult  Post Acute Care Choice:    Choice offered to:     DME Arranged:    DME Agency:     HH Arranged:    HH Agency:     Status of Service:     If discussed at [employer] of Stay Meetings, dates discussed:    Additional Comments:  Maryclare Labrador, RN 03/24/2017, 10:02 AM

## 2017-03-24 NOTE — Progress Notes (Signed)
[address]       [place] [address]             [phone]      3 Days Post-Op Procedure(s) (LRB): REDO VIDEO ASSISTED THORACOSCOPY (VATS)/WEDGE RESECTION (Right) Subjective: Feels pretty well, not SOB, some discomfort from tube  Objective: Vital signs in last 24 hours: Temp:  [97.7 F (36.5 C)-98.7 F (37.1 C)] 97.7 F (36.5 C) (03/21 0706) Pulse Rate:  [73-80] 73 (03/21 0706) Cardiac Rhythm: Normal sinus rhythm (03/21 0400) Resp:  [12-19] 13 (03/21 0817) BP: (114-128)/(59-74) 125/66 (03/21 0706) SpO2:  [96 %-99 %] 97 % (03/21 0817)  Hemodynamic parameters for last 24 hours:    Intake/Output from previous day: 03/20 0701 - 03/21 0700 In: 720 [P.O.:720] Out: 1185 [Urine:1075; Chest Tube:110] Intake/Output this shift: Total I/O In: -  Out: 255 [Urine:225; Chest Tube:30]  General appearance: alert, cooperative and no distress Heart: RRR, occ extrasystole Lungs: clear to auscultation bilaterally Abdomen: benign exam Extremities: no edema Wound: incis healing well  Lab Results: Recent Labs    03/22/17 0405 03/23/17 0400  WBC 12.7* 13.8*  HGB 9.5* 8.4*  HCT 28.0* 25.3*  PLT 439* 348   BMET:  Recent Labs    03/22/17 0405 03/23/17 0400  NA 131* 135  K 3.9 3.3*  CL 100* 103  CO2 20* 23  GLUCOSE 143* 113*  BUN 18 19  CREATININE 1.48* 1.61*  CALCIUM 8.2* 7.8*    PT/INR: No results for input(s): LABPROT, INR in the last 72 hours. ABG    Component Value Date/Time   PHART 7.391 03/22/2017 0656   HCO3 18.6 (L) 03/22/2017 0656   TCO2 20 (L) 03/22/2017 0656   ACIDBASEDEF 6.0 (H) 03/22/2017 0656   O2SAT 96.0 03/22/2017 0656   CBG (last 3)  Recent Labs    03/23/17 1133 03/23/17 1647 03/23/17 2118  GLUCAP 118* 117* 120*    Meds Scheduled Meds: . acetaminophen  1,000 mg Oral Q6H   Or  . acetaminophen (TYLENOL) oral liquid 160 mg/5 mL  1,000 mg Oral Q6H  . allopurinol  300 mg Oral Daily  . aspirin EC  81 mg Oral Daily    . atenolol  100 mg Oral Daily  . bisacodyl  10 mg Oral Daily  . enoxaparin (LOVENOX) injection  40 mg Subcutaneous Daily  . fentaNYL   Intravenous Q4H  . gabapentin  300 mg Oral TID  . senna-docusate  1 tablet Oral QHS  . sodium chloride flush  10-40 mL Intracatheter Q12H   Continuous Infusions: . sodium chloride Stopped (03/22/17 0901)  . dextrose 5 % and 0.45% NaCl 10 mL/hr at 03/23/17 0400   PRN Meds:.diphenhydrAMINE **OR** diphenhydrAMINE, naloxone **AND** sodium chloride flush, ondansetron (ZOFRAN) IV, oxyCODONE, sodium chloride flush, traMADol  Xrays Dg Chest Port 1 View  Result Date: 03/23/2017 CLINICAL DATA:  Status post right VATS EXAM: PORTABLE CHEST 1 VIEW COMPARISON:  03/22/2017 FINDINGS: Cardiac shadow is stable. Right jugular central line is seen. Thoracostomy catheters are again noted on the right. No definitive pneumothorax is seen. Multiple postsurgical changes are noted within the residual right lung. The left lung remains clear. Small bone island is again noted in the left sixth rib. Degenerative changes about shoulder joints are seen. IMPRESSION: Postsurgical changes with tubes and lines as described above. No pneumothorax is identified. Electronically Signed   By: Inez Catalina M.D.   On: 03/23/2017 09:04    Assessment/Plan: S/P Procedure(s) (LRB): REDO  VIDEO ASSISTED THORACOSCOPY (VATS)/WEDGE RESECTION (Right)  1 conts to progress well overall 2 small air leak- place to H2O seal, recheck CXR in am 3 cont PCA one more day with tube in place 4 d/c central line today 5 recheck BMET for creat/K+ , no new labs today 6 routine pulm toilet/rehab   LOS: 3 days    John Giovanni 03/24/2017

## 2017-03-25 ENCOUNTER — Inpatient Hospital Stay (HOSPITAL_COMMUNITY): Payer: Medicare Other

## 2017-03-25 LAB — BASIC METABOLIC PANEL
Anion gap: 9 (ref 5–15)
BUN: 16 mg/dL (ref 6–20)
CO2: 23 mmol/L (ref 22–32)
CREATININE: 1.44 mg/dL — AB (ref 0.61–1.24)
Calcium: 8.2 mg/dL — ABNORMAL LOW (ref 8.9–10.3)
Chloride: 103 mmol/L (ref 101–111)
GFR calc Af Amer: 53 mL/min — ABNORMAL LOW (ref >60)
GFR, EST NON AFRICAN AMERICAN: 46 mL/min — AB (ref >60)
GLUCOSE: 98 mg/dL (ref 65–99)
POTASSIUM: 3.7 mmol/L (ref 3.5–5.1)
SODIUM: 135 mmol/L (ref 135–145)

## 2017-03-25 LAB — BPAM RBC
BLOOD PRODUCT EXPIRATION DATE: 201904112359
BLOOD PRODUCT EXPIRATION DATE: 201904112359
Blood Product Expiration Date: 201904112359
Blood Product Expiration Date: 201904112359
ISSUE DATE / TIME: 201903181703
ISSUE DATE / TIME: 201903181703
ISSUE DATE / TIME: 201903181705
ISSUE DATE / TIME: 201903181705
UNIT TYPE AND RH: 5100
UNIT TYPE AND RH: 5100
Unit Type and Rh: 5100
Unit Type and Rh: 5100

## 2017-03-25 LAB — TYPE AND SCREEN
ABO/RH(D): O POS
ANTIBODY SCREEN: NEGATIVE
UNIT DIVISION: 0
UNIT DIVISION: 0
Unit division: 0
Unit division: 0

## 2017-03-25 LAB — GLUCOSE, CAPILLARY
GLUCOSE-CAPILLARY: 90 mg/dL (ref 65–99)
GLUCOSE-CAPILLARY: 99 mg/dL (ref 65–99)
GLUCOSE-CAPILLARY: 125 mg/dL — AB (ref 65–99)
Glucose-Capillary: 128 mg/dL — ABNORMAL HIGH (ref 65–99)

## 2017-03-25 MED ORDER — HYDROCHLOROTHIAZIDE 12.5 MG PO CAPS
12.5000 mg | ORAL_CAPSULE | Freq: Every day | ORAL | Status: DC
  Administered 2017-03-25 – 2017-03-26 (×2): 12.5 mg via ORAL
  Filled 2017-03-25 (×2): qty 1

## 2017-03-25 MED ORDER — FENTANYL 40 MCG/ML IV SOLN
INTRAVENOUS | Status: AC
  Administered 2017-03-25: 0 ug via INTRAVENOUS

## 2017-03-25 MED ORDER — AMLODIPINE BESYLATE 10 MG PO TABS
10.0000 mg | ORAL_TABLET | Freq: Every day | ORAL | Status: DC
  Administered 2017-03-25 – 2017-03-26 (×2): 10 mg via ORAL
  Filled 2017-03-25 (×2): qty 1

## 2017-03-25 NOTE — Discharge Instructions (Signed)
Discharge Instructions:  1. You may shower, please wash incisions daily with soap and water and keep dry.  If you wish to cover wounds with dressing you may do so but please keep clean and change daily.  No tub baths or swimming until incisions have completely healed.  If your incisions become red or develop any drainage please call our office at [phone]  2. No Driving until cleared by Dr. Leonarda Salon office and you are no longer using narcotic pain medications  3. Monitor your weight daily.. Please use the same scale and weigh at same time... If you gain 5-10 lbs in 48 hours with associated lower extremity swelling, please contact our office at [phone]  4. Fever of 101.5 for at least 24 hours with no source, please contact our office at [phone]  5. Activity- up as tolerated, please walk at least 3 times per day.  Avoid strenuous activity for several weeks  6. If any questions or concerns arise, please do not hesitate to contact our office at [phone]

## 2017-03-25 NOTE — Discharge Summary (Addendum)
Physician Discharge Summary  Patient ID: Jerry Wise MRN: [id number] DOB/AGE: Jan 06, 1940 77 y.o.  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 Lung nodule 03/21/2017  . Malignant neoplasm of right upper lobe of lung ([place]) 01/26/2017  . Tobacco use 01/26/2017  . Mediastinal lymphadenopathy 01/26/2017  . Prostate cancer ([place]) 01/26/2017  . Hypertension 01/26/2017  . Hyperparathyroidism, primary ([place]) 04/24/2012  . 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 Lung nodule 03/21/2017  . S/P lobectomy of lung 03/03/2017  . Malignant neoplasm of right upper lobe of lung ([place]) 01/26/2017  . Tobacco use 01/26/2017  . Mediastinal lymphadenopathy 01/26/2017  . Prostate cancer ([place]) 01/26/2017  . Hypertension 01/26/2017  . Hyperparathyroidism, primary ([place]) 04/24/2012  . Multiple thyroid nodules 04/24/2012   Discharged Condition: good  History of Present Illness:  Jerry Wise is a 77 yo AA male with PMH of tobacco abuse, Stage III CKD, Pernicious anemia, Hyperparathyroidism S/P Parathyroidectomy, prostate cancer treated with RA seeds, hypertension, and gouty arthritis.  He began participation in the lung cancer screening program in 2016.  CT scan obtained in September 2018 showed a new 6.8 mm right upper lobe nodule.  Repeat CT scan in December showed the nodule increased in size to 8.57mm.  PET CT scan was obtained in January that showed metabolic uptake in the nodule.  There was also markedly hypermetabolic bilateral mediastinal hilar and infrahilar adenopathy.  He underwent a navigational bronchoscopy and endobronchial ultrasound.  Biopsies obtained  at that time showed squamous cell carcinoma.  He was referred to Dr. Roxan Hockey who ultimately recommended VATS procedure.  This was initially performed on 03/03/2017.  Unfortunately the specimen obtained did not have the nodule present and due to difficulty anatomy it was felt the tumor could still be in the lung.  The patient wanted to be discharged home.  He presented to follow up with Dr. Roxan Hockey who discussed with the patient at length the need for repeat VATS.  The risks and benefits of the procedure were again explained to the patient and he was agreeable to proceed.    Hospital Course:   Mr. [name] again presented to [facility] on [facility]/21/2017.  He was taken to the operating room and underwent Redo Video Assisted Thoracoscopy with wedge resection to remove remainder of right upper lobe.  He also had intercostal nerve block with Liposomal Bupivicaine.  He tolerated the procedure without difficulty, was extubated and was taken to the PACU in stable condition. The patient progressed without much difficulty post operatively.  His chest tubes had an air leak initially post operatively and were left to suction on POD #1.  His arterial line and foley catheter were removed without difficulty on POD #1 and 2.  His posterior chest tube was removed on 03/23/2017.  He was found to be hypokalemic and supplementation was initiated.  His remaining chest tube was placed to water seal on 03/24/2017.  There was evidence of tidaling from his chest tube.  His chest tube was clamped and repeat CXR was obtained this showed no evidence of pneumothorax and stable appearance of sub cutaneous emphysema.  His CXR remained stable in appearance with  subcutaneous emphysema but a small basilar space developed.  This has remained stable on subsequent CXR. He was hypertensive and restarted on home regimen of Amlodipine and HCTZ.   His creatinine was elevated during his hospital but returned to his baseline, with most recent level  being 1.44.  He is ambulating without difficulty.  His pain is well controlled.  He is tolerating a diet.  He is medically stable for discharge home today.  Final Pathology is pasted below.    Significant Diagnostic Studies: Pathology:  1. Lung, resection (segmental or lobe), RUL - SQUAMOUS CELL CARCINOMA, 0.9 CM. - MARGIN NOT INVOLVED. 2. Lung, wedge biopsy/resection, Portion of RUL - SQUAMOUS CELL CARCINOMA, 1.2 CM. - MARGINS NOT INVOLVED.  Treatments: surgery:   Redo right video-assisted thoracoscopy, takedown of adhesions, wedge resection to complete right upper lobectomy, intercostal nerve block with liposomal bupivacaine.  Disposition: Discharge disposition: 01-Home or Self Care      Home  Discharge medications:  Allergies as of 03/26/2017   No Known Allergies     Medication List    TAKE these medications   acetaminophen 500 MG tablet Commonly known as:  TYLENOL Take 1,000 mg by mouth every 8 (eight) hours as needed for mild pain or headache.   allopurinol 300 MG tablet Commonly known as:  ZYLOPRIM Take 300 mg by mouth daily.   amLODipine 10 MG tablet Commonly known as:  NORVASC Take 10 mg by mouth daily.   aspirin EC 81 MG tablet Take 81 mg by mouth daily.   atenolol 100 MG tablet Commonly known as:  TENORMIN Take 100 mg by mouth daily.   Cholecalciferol 2000 units Caps Take 2,000 Units by mouth daily.   hydrochlorothiazide 12.5 MG capsule Commonly known as:  MICROZIDE Take 12.5 mg by mouth daily.   oxyCODONE 5 MG immediate release tablet Commonly known as:  Oxy IR/ROXICODONE Take 1 tablet (5 mg total) by mouth every 4 (four) hours as needed for severe pain.      Follow-up Information    [name], MD Follow up on 04/12/2017.   Specialty:  Cardiothoracic Surgery Why:  Appointment is at 4:30, please get CXR at 4:00 at [facility] located on the first floor of our office building Contact information: [address] [place] [address] [phone]           Signed: Ellwood Handler 03/26/2017, 9:57 AM

## 2017-03-25 NOTE — Progress Notes (Signed)
4 Days Post-Op Procedure(s) (LRB): REDO VIDEO ASSISTED THORACOSCOPY (VATS)/WEDGE RESECTION (Right) Subjective: Some pain from tube  Objective: Vital signs in last 24 hours: Temp:  [97.6 F (36.4 C)-98.8 F (37.1 C)] 97.9 F (36.6 C) (03/22 0747) Pulse Rate:  [79-86] 84 (03/22 0747) Cardiac Rhythm: Normal sinus rhythm;Heart block (03/22 0701) Resp:  [13-23] 14 (03/22 0747) BP: (124-147)/(66-78) 147/75 (03/22 0747) SpO2:  [97 %-100 %] 100 % (03/22 0747)  Hemodynamic parameters for last 24 hours:    Intake/Output from previous day: 03/21 0701 - 03/22 0700 In: 1154 [P.O.:684; I.V.:470] Out: 881 [Urine:751; Chest Tube:130] Intake/Output this shift: No intake/output data recorded.  General appearance: alert, cooperative and no distress Neurologic: intact Heart: regular rate and rhythm Lungs: clear to auscultation bilaterally Wound: clean and dry large tidal movement in tubing with respiration, no apparent air leak  Lab Results: Recent Labs    03/23/17 0400  WBC 13.8*  HGB 8.4*  HCT 25.3*  PLT 348   BMET:  Recent Labs    03/23/17 0400 03/25/17 0309  NA 135 135  K 3.3* 3.7  CL 103 103  CO2 23 23  GLUCOSE 113* 98  BUN 19 16  CREATININE 1.61* 1.44*  CALCIUM 7.8* 8.2*    PT/INR: No results for input(s): LABPROT, INR in the last 72 hours. ABG    Component Value Date/Time   PHART 7.391 03/22/2017 0656   HCO3 18.6 (L) 03/22/2017 0656   TCO2 20 (L) 03/22/2017 0656   ACIDBASEDEF 6.0 (H) 03/22/2017 0656   O2SAT 96.0 03/22/2017 0656   CBG (last 3)  Recent Labs    03/24/17 1227 03/24/17 1700 03/24/17 2118  GLUCAP 131* 124* 230*    Assessment/Plan: S/P Procedure(s) (LRB): REDO VIDEO ASSISTED THORACOSCOPY (VATS)/WEDGE RESECTION (Right) -Overall doing well There is extreme tidal movement in tubing but I do not see a definite air leak. To be on the safe side will observe with tube clamped and get another CXR at noon. If OK will dc CT later today. RN knows to  unclamp tube if any issues develop Stage III CKD Creatinine below baseline BP trending up- resume amlodipine and HCTZ Continue ambulation   LOS: 4 days    [name] 03/25/2017

## 2017-03-25 NOTE — Progress Notes (Signed)
Jerry Wise has tolerated his chest tube being clamped all day without issue.  CXr showed no pneumo Will dc chest tube  Remo Lipps C. Roxan Hockey, MD [facility] [phone]

## 2017-03-25 NOTE — Care Management Important Message (Signed)
Important Message  Patient Details  Name: Jerry Wise MRN: [id number] Date of Birth: 09-Aug-1940   Medicare Important Message Given:  Yes    Barb Merino Idriss Quackenbush 03/25/2017, 2:44 PM

## 2017-03-25 NOTE — Progress Notes (Signed)
Chest tube has been removed per order.  Patient tolerated well.  Denies SOB or pain. Chest xray ordered.  Will continue to monitor.

## 2017-03-25 NOTE — Progress Notes (Signed)
Wasted 33mls fentanyl with Curator

## 2017-03-26 ENCOUNTER — Inpatient Hospital Stay (HOSPITAL_COMMUNITY): Payer: Medicare Other

## 2017-03-26 LAB — GLUCOSE, CAPILLARY: GLUCOSE-CAPILLARY: 104 mg/dL — AB (ref 65–99)

## 2017-03-26 MED ORDER — OXYCODONE HCL 5 MG PO TABS: 5.0000 mg | ORAL_TABLET | ORAL | 0 refills | Status: DC | PRN

## 2017-03-26 NOTE — Progress Notes (Addendum)
      [address]       ,[place] [address]             [phone]      5 Days Post-Op Procedure(s) (LRB): REDO VIDEO ASSISTED THORACOSCOPY (VATS)/WEDGE RESECTION (Right)   Subjective:  No complaints.  States he feels pretty good.  He has already been up walking in the hallways.  He wants to go home today.  Objective: Vital signs in last 24 hours: Temp:  [98.8 F (37.1 C)-99 F (37.2 C)] 98.9 F (37.2 C) (03/23 0732) Pulse Rate:  [77-88] 83 (03/23 0357) Cardiac Rhythm: Heart block (03/23 0701) Resp:  [16-25] 17 (03/23 0357) BP: (119-154)/(66-71) 131/67 (03/23 0357) SpO2:  [98 %-99 %] 99 % (03/23 0357)  Intake/Output from previous day: 03/22 0701 - 03/23 0700 In: 1247.5 [P.O.:1080; I.V.:167.5] Out: 1150 [Urine:1150]  General appearance: alert, cooperative and no distress Heart: regular rate and rhythm Lungs: clear to auscultation bilaterally Abdomen: soft, non-tender; bowel sounds normal; no masses,  no organomegaly Extremities: extremities normal, atraumatic, no cyanosis or edema Wound: clean and dry, sub q air mild on right side  Lab Results: No results for input(s): WBC, HGB, HCT, PLT in the last 72 hours. BMET:  Recent Labs    03/25/17 0309  NA 135  K 3.7  CL 103  CO2 23  GLUCOSE 98  BUN 16  CREATININE 1.44*  CALCIUM 8.2*    PT/INR: No results for input(s): LABPROT, INR in the last 72 hours. ABG    Component Value Date/Time   PHART 7.391 03/22/2017 0656   HCO3 18.6 (L) 03/22/2017 0656   TCO2 20 (L) 03/22/2017 0656   ACIDBASEDEF 6.0 (H) 03/22/2017 0656   O2SAT 96.0 03/22/2017 0656   CBG (last 3)  Recent Labs    03/25/17 1653 03/25/17 2116 03/26/17 0819  GLUCAP 125* 128* 104*    Assessment/Plan: S/P Procedure(s) (LRB): REDO VIDEO ASSISTED THORACOSCOPY (VATS)/WEDGE RESECTION (Right)  1. CV- NSR, hemodynamically stable, BP improved 2. Pulm- CT removed yesterday, follow up CXR with basilar pneumothorax that is small, CXR  this morning shows stable appearance of sub q emphysema and stable appearance of basilar space 3. Dispo- patient is stable, will review CXR with Dr. Roxy Manns and likely d/c patient home today   LOS: 5 days    Ellwood Handler 03/26/2017  I have seen and examined the patient and agree with the assessment and plan as outlined.  CXR stable.  D/C home  Rexene Alberts, MD 03/26/2017 10:20 AM

## 2017-03-26 NOTE — Progress Notes (Signed)
Reviewed discharge instructions with pt including follow up appts, medications and signs and symptoms of infection and when to call MD. Reviewed MD address and phone number. Reviewed wound care for VATs and chest tube sites.

## 2017-04-12 ENCOUNTER — Ambulatory Visit
Admission: RE | Admit: 2017-04-12 | Discharge: 2017-04-12 | Disposition: A | Discharge status: Home / Self Care | Payer: Medicare Other | Source: Ambulatory Visit | Attending: Thoracic Surgery (Cardiothoracic Vascular Surgery) | Admitting: Thoracic Surgery (Cardiothoracic Vascular Surgery)

## 2017-04-12 ENCOUNTER — Other Ambulatory Visit: Payer: Self-pay | Admitting: Thoracic Surgery (Cardiothoracic Vascular Surgery)

## 2017-04-12 ENCOUNTER — Ambulatory Visit (INDEPENDENT_AMBULATORY_CARE_PROVIDER_SITE_OTHER): Payer: Self-pay | Admitting: Thoracic Surgery (Cardiothoracic Vascular Surgery)

## 2017-04-12 VITALS — BP 127/76 | HR 80 | Resp 20 | Ht 74.0 in | Wt 160.0 lb

## 2017-04-12 DIAGNOSIS — Z902 Acquired absence of lung [part of]: Secondary | ICD-10-CM

## 2017-04-12 DIAGNOSIS — C3411 Malignant neoplasm of upper lobe, right bronchus or lung: Secondary | ICD-10-CM

## 2017-04-12 DIAGNOSIS — C3491 Malignant neoplasm of unspecified part of right bronchus or lung: Secondary | ICD-10-CM

## 2017-04-12 MED ORDER — OXYCODONE HCL 5 MG PO TABS: 5.0000 mg | ORAL_TABLET | Freq: Four times a day (QID) | ORAL | 0 refills | Status: DC | PRN

## 2017-04-12 NOTE — Progress Notes (Signed)
[address]       [place] [address]             [phone]     HPI: Jerry Wise returns for a scheduled follow-up visit  Mr. Lowrey is a 77 year old gentleman with a history of tobacco abuse who had a right upper lobe nodule found on a low-dose screening CT.  A PET/CT showed the nodule was hypermetabolic.  He had markedly hypermetabolic mediastinal and hilar adenopathy.  Dr. Lamonte Sakai did a navigational bronchoscopy.  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  The nodes were nondiagnostic.  I did a mediastinoscopy and the node showed some old granulomatous disease but no malignancy.  I did a thoracoscopic right upper lobectomy on 03/04/2017.  He had severe adhesions and incomplete fissures.  This turned out to be an incomplete resection and the nodule was still present.  All of the lymph nodes that were removed were negative.  I advised him to go back to surgery but he refused and was discharged home.  I was able to convince him on follow-up to go back for completion of this right upper lobectomy.  That was done on 03/21/2017.  He went home on day 5.  Says that he does still have some incisional pain.  He is taking oxycodone a couple of times a day.  He is always taking it at night before he goes to bed.  He sleeps on his right side and says he cannot sleep without it.  He is not having any shortness of breath at rest but does notice some shortness of breath with exertion.  Past Medical History:  Diagnosis Date  . Asthma    as child  . Cancer ([place])    lung cancer  . Chronic renal insufficiency    NEPHROLOGIST-  DR Justin Mend -- LAST VISIT MARCH 2014  . Dyspnea   . GERD (gastroesophageal reflux disease)   . Gout   . Gouty arthritis    ALL JOINTS  . History of kidney stones   . History of prostate cancer    S/P RADIACTIVE SEED IMPLANTS  . Hydronephrosis, bilateral   . Hypertension   . Parathyroid abnormality (HCC)    RIGHT SIDE NODULES--  ELEVATED CALCIUM LEVEL--  CURRENT FURTHER TESTING BEING DONE  . Pernicious anemia FOLLOWED BY DR WEBB   x3 feraheme injection --- last one 03-22-2012  . Urethral stricture     Current Outpatient Medications  Medication Sig Dispense Refill  . acetaminophen (TYLENOL) 500 MG tablet Take 1,000 mg by mouth every 8 (eight) hours as needed for mild pain or headache.    . allopurinol (ZYLOPRIM) 300 MG tablet Take 300 mg by mouth daily.     Marland Kitchen amLODipine (NORVASC) 10 MG tablet Take 10 mg by mouth daily.     Marland Kitchen aspirin EC 81 MG tablet Take 81 mg by mouth daily.    Marland Kitchen atenolol (TENORMIN) 100 MG tablet Take 100 mg by mouth daily.     . Cholecalciferol 2000 units CAPS Take 2,000 Units by mouth daily.    . hydrochlorothiazide (MICROZIDE) 12.5 MG capsule Take 12.5 mg by mouth daily.    Marland Kitchen oxyCODONE (OXY IR/ROXICODONE) 5 MG immediate release tablet Take 1 tablet (5 mg total) by mouth every 6 (six) hours as needed for severe pain. 30 tablet 0   No current facility-administered medications for this visit.     Physical Exam BP 127/76   Pulse 80  Resp 20   Ht 6\' 2"  (1.88 m)   Wt 160 lb (72.6 kg)   SpO2 96% Comment: RA  BMI 20.2 kg/m  77 year old man in no acute distress Alert and oriented x3 with no focal deficits Lungs diminished breath sounds at right base, otherwise clear Mild subcutaneous emphysema Incisions healing well No peripheral edema   Diagnostic Tests: CHEST - 2 VIEW  COMPARISON:  Chest radiograph March 26, 2017  FINDINGS: Small RIGHT pneumothorax increased from prior examination, status post RIGHT lobectomy. Small RIGHT pleural effusion. No mediastinal shift. No LEFT consolidation or pleural effusion. Persistent RIGHT greater than LEFT chest wall subcutaneous emphysema. Osseous structures are nonsuspicious.  IMPRESSION: Small RIGHT hydropneumothorax. Persistent RIGHT greater than LEFT chest wall subcutaneous emphysema.  Status post RIGHT lobectomy.  Electronically Signed: By: Elon Alas M.D. On: 04/12/2017 16:13 I personally reviewed the chest x-ray images and concur with the findings noted above  Impression: Mr. 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  This was done in 2 parts as the nodule was not in the original operative specimen.  He did end up with a complete resection.  All of the lymph nodes were negative.  He is doing reasonably well presently.  He is not having any shortness of breath at rest.  He does get short of breath with activity.  I encouraged him to gradually increase his activities.  He should not drive until he is no longer taking the oxycodone.  He is about out of his prescription for oxycodone.  He is using it primarily at night before he goes to bed.  I gave him a prescription for oxycodone 5 mg tablets 1 every 6 hours as needed for pain, dispense 30 tablets, no refills.  I did encourage him to start gradually weaning himself off of that medication over time.  I am going to arrange for him to be seen in our multidisciplinary thoracic oncology clinic by Dr. Julien Nordmann.  He should not need any adjuvant treatment.  Plan: Referral to Dr. Julien Nordmann Return in 1 month with PA and lateral chest x-ray He knows to call if he has any issues arise in the meantime.  [name], MD [facility] [phone]

## 2017-04-22 ENCOUNTER — Telehealth: Payer: Self-pay | Admitting: *Deleted

## 2017-04-22 NOTE — Telephone Encounter (Signed)
Oncology Nurse Navigator Documentation  Oncology Nurse Navigator Flowsheets 04/22/2017  Navigator Location [facility]  Navigator Encounter Type Telephone/I received referral from Dr. Leonarda Salon office that he would like Jerry Wise to be seen by Dr. Julien Nordmann. I called and spoke with Jerry Wise and his wife today.  Jerry Wise states "I am not coming to see another doctor" .  I listened as he explained more about not wanting to come to see Dr. Julien Nordmann. I tried to explained the reason for the referral.  He stated he could not afford to see another doctor and I tried to help him with this but he would not listen. I will update Dr. Leonarda Salon office.   Telephone Outgoing Call  Barriers/Navigation Needs Coordination of Care  Interventions Coordination of Care  Acuity Level 2  Acuity Level 2 Other  Time Spent with Patient 30

## 2017-04-25 ENCOUNTER — Encounter: Payer: Self-pay | Admitting: *Deleted

## 2017-04-25 NOTE — Progress Notes (Signed)
Oncology Nurse Navigator Documentation  Oncology Nurse Navigator Flowsheets 04/25/2017  Navigator Location [facility]  Navigator Encounter Type /I updated Dr. Roxan Hockey on patient not wanting to come see Dr. Julien Nordmann, medical oncology.  He will continue to follow patient.   Support Groups/Services Other  Acuity Level 1  Time Spent with Patient 15

## 2017-05-09 ENCOUNTER — Other Ambulatory Visit: Payer: Self-pay | Admitting: Thoracic Surgery (Cardiothoracic Vascular Surgery)

## 2017-05-09 DIAGNOSIS — R911 Solitary pulmonary nodule: Secondary | ICD-10-CM

## 2017-05-10 ENCOUNTER — Other Ambulatory Visit: Payer: Self-pay

## 2017-05-10 ENCOUNTER — Encounter: Payer: Self-pay | Admitting: Thoracic Surgery (Cardiothoracic Vascular Surgery)

## 2017-05-10 ENCOUNTER — Ambulatory Visit
Admission: RE | Admit: 2017-05-10 | Discharge: 2017-05-10 | Disposition: A | Discharge status: Home / Self Care | Payer: Medicare Other | Source: Ambulatory Visit | Attending: Thoracic Surgery (Cardiothoracic Vascular Surgery) | Admitting: Thoracic Surgery (Cardiothoracic Vascular Surgery)

## 2017-05-10 ENCOUNTER — Ambulatory Visit (INDEPENDENT_AMBULATORY_CARE_PROVIDER_SITE_OTHER): Payer: Self-pay | Admitting: Thoracic Surgery (Cardiothoracic Vascular Surgery)

## 2017-05-10 VITALS — BP 121/73 | HR 77 | Resp 16 | Ht 74.0 in | Wt 159.4 lb

## 2017-05-10 DIAGNOSIS — Z902 Acquired absence of lung [part of]: Secondary | ICD-10-CM

## 2017-05-10 DIAGNOSIS — C3411 Malignant neoplasm of upper lobe, right bronchus or lung: Secondary | ICD-10-CM

## 2017-05-10 DIAGNOSIS — R911 Solitary pulmonary nodule: Secondary | ICD-10-CM

## 2017-05-10 NOTE — Progress Notes (Signed)
[address]       [place] [address]             [phone]     HPI: Mr. Borntreger returns for scheduled follow-up visit  Bonifacio Hallas is a 77 year old gentleman with a history of tobacco abuse who had a right upper lobe nodule found on a low-dose screening CT.  The nodule was hypermetabolic on PET/CT.  He also had hypermetabolic mediastinal and hilar adenopathy.  EBUS showed no tumor in the nodes but navigational bronchoscopy confirmed squamous cell carcinoma in the right upper lobe.  I did a mediastinoscopy and the nodes showed granulomatous disease but no malignancy.  I did a thoracoscopic right upper lobectomy on 03/04/2017.  He had severe adhesions and incomplete fissures and it turned out to be an incomplete resection.  I had to go back and do a completion of the lobectomy.  He refused at first but ultimately consented and we did that on 03/21/2017.  He ended up having stage IA disease.  He did well after the second operation went home on day 5.  I saw him in the office on 04/12/2017.  Was still having a fair amount of incisional pain was still using oxycodone.  Since his last visit his pain has improved significantly.  He says he is essentially off the oxycodone now.  He does not need any more prescribed.  He is not having any significant issues with his breathing.  His primary complaint is that he has a poor appetite.  He also has occasional vomiting which she says is attributable to phlegm in his throat that he has difficulty clearing and causes him to gag.  He only has nausea when he is gagging.  He was referred to [facility] but refused.  He said he could not afford to have so many co-pays.  Past Medical History:  Diagnosis Date  . Asthma    as child  . Cancer ([place])    lung cancer  . Chronic renal insufficiency    NEPHROLOGIST-  DR Justin Mend -- LAST VISIT MARCH 2014  . Dyspnea   . GERD (gastroesophageal reflux disease)   . Gout   . Gouty arthritis    ALL JOINTS  . History  of kidney stones   . History of prostate cancer    S/P RADIACTIVE SEED IMPLANTS  . Hydronephrosis, bilateral   . Hypertension   . 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 Pernicious anemia FOLLOWED BY DR WEBB   x3 feraheme injection --- last one 03-22-2012  . Urethral stricture     Current Outpatient Medications  Medication Sig Dispense Refill  . acetaminophen (TYLENOL) 500 MG tablet Take 1,000 mg by mouth every 8 (eight) hours as needed for mild pain or headache.    . allopurinol (ZYLOPRIM) 300 MG tablet Take 300 mg by mouth daily.     Marland Kitchen amLODipine (NORVASC) 10 MG tablet Take 10 mg by mouth daily.     Marland Kitchen aspirin EC 81 MG tablet Take 81 mg by mouth daily.    Marland Kitchen atenolol (TENORMIN) 100 MG tablet Take 100 mg by mouth daily.     . Cholecalciferol 2000 units CAPS Take 2,000 Units by mouth daily.    . hydrochlorothiazide (MICROZIDE) 12.5 MG capsule Take 12.5 mg by mouth daily.     No current facility-administered medications for this visit.     Physical  Exam BP 121/73 (BP Location: Left Arm, Patient Position: Sitting, Cuff Size: Normal)   Pulse 77   Resp 16   Ht 6\' 2"  (1.88 m)   Wt 159 lb 6.4 oz (72.3 kg)   SpO2 99% Comment: ON RA  BMI 20.62 kg/m  77 year old man in no acute distress Alert and oriented x3 with no focal deficits Lungs diminished at right base, otherwise clear Incisions well-healed  Diagnostic Tests: CHEST - 2 VIEW  COMPARISON:  04/12/2017 and earlier.  FINDINGS: Resolved right neck and chest wall subcutaneous gas since April. Small to moderate right hydropneumothorax is stable to mildly decreased. Stable architectural distortion at the right hilum with staple lines and surgical clips in the right lung. Other mediastinal contours are stable. The left lung appears stable and clear. Visualized tracheal air column is within normal limits. Stable visualized osseous structures.  Negative visible bowel gas pattern.  IMPRESSION: 1. Postoperative changes to the right hemithorax with mildly decreased small to moderate right hydropneumothorax since April. Resolved subcutaneous emphysema. 2. No new cardiopulmonary abnormality.   Electronically Signed   By: Genevie Ann M.D.   On: 05/10/2017 15:26 I personally reviewed the chest x-ray images and concur with the findings noted above  Impression: Mr. Richter is a 77 year old man who had a right upper lobectomy for stage IA non-small cell carcinoma.  He does not need any adjuvant therapy.  I referred him to oncology but he does not want to go through with that due to the cost of co-pays.  I informed him of the need for continued follow-up he says he would like to do that with me.  I will plan to get a CT on him in 6 months.  He will need CTs every 6 months for the first 2 years and then annually after that.  His incisional pain is improved dramatically and is no longer requiring oxycodone.  Poor appetite-I reassured him that this is very common in the early stages after surgery and that his appetite will improve with time.  Plan: Return in 6 months with CT chest   [name], MD [facility] [phone]

## 2017-10-07 ENCOUNTER — Other Ambulatory Visit: Payer: Self-pay | Admitting: *Deleted

## 2017-10-07 DIAGNOSIS — C3490 Malignant neoplasm of unspecified part of unspecified bronchus or lung: Secondary | ICD-10-CM

## 2017-11-15 ENCOUNTER — Ambulatory Visit (INDEPENDENT_AMBULATORY_CARE_PROVIDER_SITE_OTHER): Payer: Medicare Other | Admitting: Thoracic Surgery (Cardiothoracic Vascular Surgery)

## 2017-11-15 ENCOUNTER — Other Ambulatory Visit: Payer: Self-pay

## 2017-11-15 ENCOUNTER — Ambulatory Visit
Admission: RE | Admit: 2017-11-15 | Discharge: 2017-11-15 | Disposition: A | Discharge status: Home / Self Care | Payer: Medicare Other | Source: Ambulatory Visit | Attending: Thoracic Surgery (Cardiothoracic Vascular Surgery) | Admitting: Thoracic Surgery (Cardiothoracic Vascular Surgery)

## 2017-11-15 ENCOUNTER — Encounter: Payer: Self-pay | Admitting: Thoracic Surgery (Cardiothoracic Vascular Surgery)

## 2017-11-15 VITALS — BP 130/70 | HR 77 | Resp 18 | Ht 74.0 in | Wt 156.5 lb

## 2017-11-15 DIAGNOSIS — Z902 Acquired absence of lung [part of]: Secondary | ICD-10-CM

## 2017-11-15 DIAGNOSIS — C3411 Malignant neoplasm of upper lobe, right bronchus or lung: Secondary | ICD-10-CM | POA: Diagnosis not present

## 2017-11-15 DIAGNOSIS — C3490 Malignant neoplasm of unspecified part of unspecified bronchus or lung: Secondary | ICD-10-CM

## 2017-11-15 MED ORDER — MEGESTROL ACETATE 40 MG PO TABS: 80.0000 mg | ORAL_TABLET | Freq: Every day | ORAL | 3 refills | Status: DC

## 2017-11-15 NOTE — Progress Notes (Signed)
[address]       [place] [address]             [phone]      HPI: [name] returns for a scheduled follow-up visit  [name] is a 77 year old gentleman who underwent a right upper lobectomy and node dissection for a stage IA non-small cell carcinoma in March 2019.  He refused to see oncology and now returns for a six-month follow-up visit.  He says he feels well.  Is not having any incisional pain.  He gets short of breath with activity but that has not changed.  His biggest complaint is that his appetite remains poor.  He is lost about 30 pounds this year since his surgery.  He just has never gotten his appetite back.  Past Medical History:  Diagnosis Date  . Asthma    as child  . Cancer ([place])    lung cancer  . Chronic renal insufficiency    NEPHROLOGIST-  Jerry Wise -- LAST VISIT MARCH 2014  . Dyspnea   . GERD (gastroesophageal reflux disease)   . Gout   . Gouty arthritis    ALL JOINTS  . History of kidney stones   . History of prostate cancer    S/P RADIACTIVE SEED IMPLANTS  . Hydronephrosis, bilateral   . Hypertension   . 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 Pernicious anemia FOLLOWED BY Jerry Wise   x3 feraheme injection --- last one 03-22-2012  . Urethral stricture      Current Outpatient Medications  Medication Sig Dispense Refill  . acetaminophen (TYLENOL) 500 MG tablet Take 1,000 mg by mouth every 8 (eight) hours as needed for mild pain or headache.    . allopurinol (ZYLOPRIM) 300 MG tablet Take 300 mg by mouth daily.     Marland Kitchen amLODipine (NORVASC) 10 MG tablet Take 10 mg by mouth daily.     Marland Kitchen aspirin EC 81 MG tablet Take 81 mg by mouth daily.    Marland Kitchen atenolol (TENORMIN) 100 MG tablet Take 100 mg by mouth daily.     . Cholecalciferol 2000 units CAPS Take 2,000 Units by mouth daily.    . hydrochlorothiazide (MICROZIDE) 12.5 MG capsule Take 12.5 mg by mouth daily.      . megestrol (MEGACE) 40 MG tablet Take 2 tablets (80 mg total) by mouth daily. 30 tablet 3   No current facility-administered medications for this visit.     Physical Exam BP 130/70 (BP Location: Left Arm, Patient Position: Sitting, Cuff Size: Normal)   Pulse 77   Resp 18   Ht 6\' 2"  (1.88 m)   Wt 156 lb 8 oz (71 kg)   SpO2 97% Comment: RA  BMI 20.09 kg/m  Thin 77 year old man in no acute distress Alert and oriented x3 with no focal deficits Lungs diminished breath sounds on right, otherwise clear Cardiac regular rate and rhythm normal S1-S2 No cervical or subclavicular adenopathy  Diagnostic Tests: CT CHEST WITHOUT CONTRAST  TECHNIQUE: Multidetector CT imaging of the chest was performed following the standard protocol without IV contrast.  COMPARISON:  PET-CT 01/24/2017  FINDINGS: Cardiovascular: Normal heart size. No pericardial effusion. The aortic atherosclerosis. Calcification within the LAD and RCA coronary arteries noted.  Mediastinum/Nodes: Normal appearance of the thyroid gland. The trachea appears patent and is midline. Normal appearance of the esophagus. Similar appearance of prominent mediastinal lymph  nodes without definite adenopathy. No axillary or supraclavicular adenopathy. Hilar structures are suboptimally evaluated due to lack of IV contrast material.  Lungs/Pleura: There is a large loculated pneumothorax overlying the anterior right lung. This measures 21.3 by 6.6 by 6.9 cm (volume = 510 cm^3). There are several small airways which extends from the hilum to the anterior margin of the right lung which is bordered by the pneumothorax. Along the anterolateral apical parietal pleura there is mild nodularity measuring 5.4 mm, image 34/8. Loculated right pleural effusion is noted overlying the posterior aspect of the right lung measuring 13.0 x 2.9 by 8.8 cm (volume = 170 cm^3) status post right upper lobectomy. Again noted is extensive  micro nodularity in a centrilobular distribution throughout both lungs. This has an upper lobe predominance and appears similar to previous exam. Pulmonary nodule in the left upper lobe is unchanged measuring 4 mm, image 71/8.  Upper Abdomen: No acute abnormality.  Musculoskeletal: No chest wall mass or suspicious bone lesions identified.  IMPRESSION: 1. Large loculated pneumothorax is identified overlying the anterior right lung with a volume of approximately 510 cc. Findings are favored to reflect trapped lung secondary to postsurgical inflammation/scarring. Of note, several small airways are noted extending to the anterior margin of the left lung. 2. Small loculated right pleural effusion overlying the posterior measures approximately 170 cc. 3. No specific findings identified to suggest residual/recurrent disease.   Electronically Signed   By: Kerby Moors M.D.   On: 11/15/2017 13:10 I personally reviewed the CT images and concur with the findings noted above  Impression: [name] is a 77 year old gentleman who underwent a right upper lobectomy for stage IA (T1, N0) squamous cell carcinoma earlier this year.  He is now a little over 6 months out from the surgery.  He has no evidence of recurrent disease.  He will need continued semiannual follow-up for the first 2 years and then annually after that.  He does have a relatively large space after lobectomy.  His respiratory status is stable.  He is concerned about his appetite and weight loss.  He says he is usually hungry in the mornings needs breakfast but really is not hungry after that.  He is tried using ensure but does not have a real affinity for that.  Offered him a trial of Megace to see if that would help.  He is interested in doing that.  Plan: Megace-80 mg p.o. Daily-we will start with a low dose and then if he does not have any adverse effects can consider increasing if it is not having the desired  effect.  Return in 6 months with CT angiogram of chest.  Jerry Nakayama, MD [facility] [phone]

## 2018-03-14 IMAGING — DX DG CHEST 2V
2 series · 2 of 2 positions shown · non-contrast
Comparison: Chest x-rays dated 03/07/2017, 03/03/2017 and
03/01/2017

CLINICAL DATA: Malignant neoplasm of the right upper lobe. Status
post VATS procedure on 03/03/2017 with right upper lobectomy.

EXAM:
CHEST - 2 VIEW

[dg chest 2 view (1 of 2)]
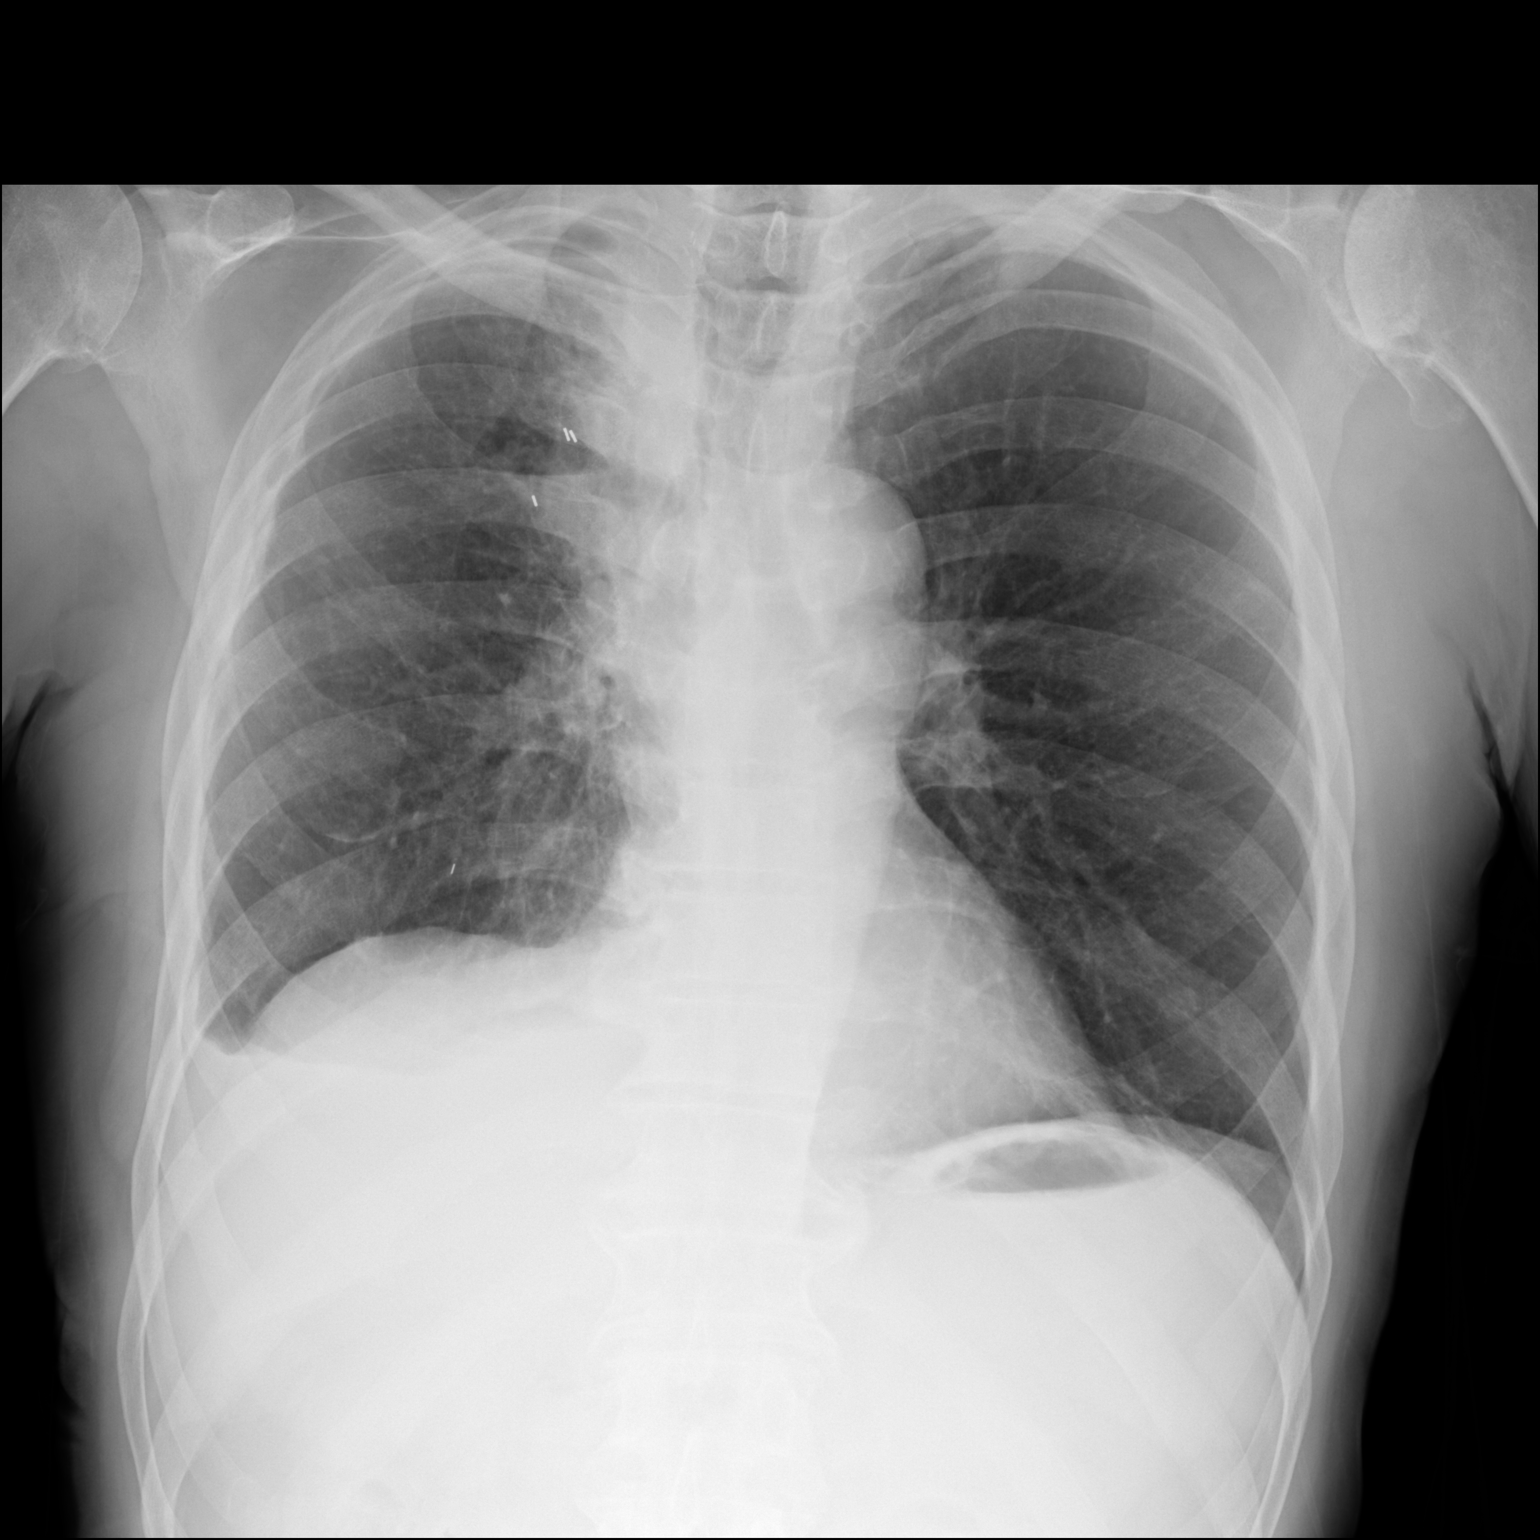

[dg chest 2 view (2 of 2)]
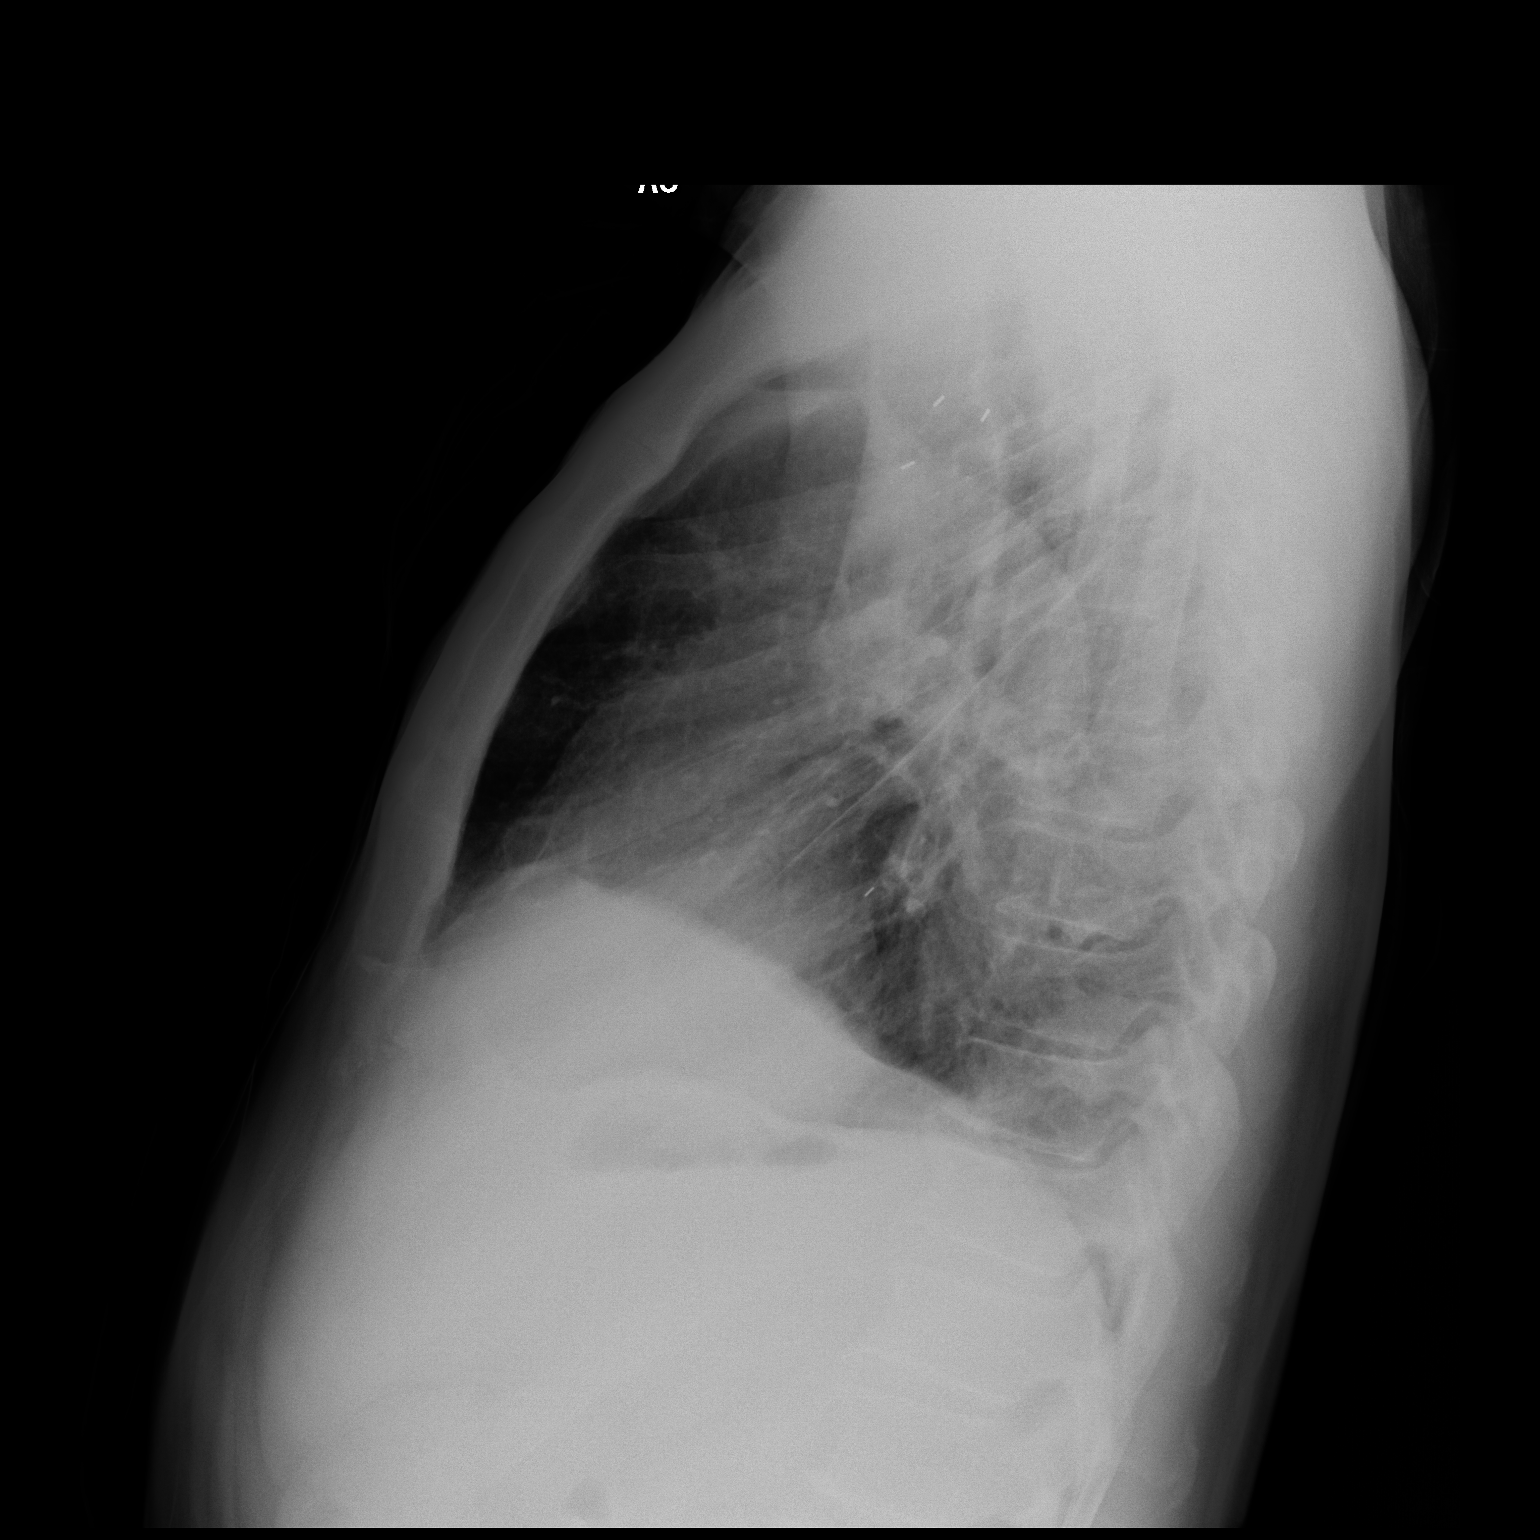

[2 of 2 positions shown; findings below may reference images not displayed]

FINDINGS: There is a tiny loculated hydropneumothorax anteromedially just
above the right hilum. Small right pleural effusion. Slight pleural
thickening medially at the right apex.

Left lung is clear.  Heart size and vascularity are normal.

No acute bone abnormality. Arthritic changes of both shoulders, left
more than right.
IMPRESSION: 1. Tiny right upper anterior hydropneumothorax.
2. Minimal right pleural effusion.

## 2018-03-17 IMAGING — CR DG CHEST 2V
2 series · 2 of 2 positions shown · non-contrast
Comparison: 03/15/2017.

CLINICAL DATA: Lung nodule.

EXAM:
CHEST - 2 VIEW

[w chest pa (1 of 2)]
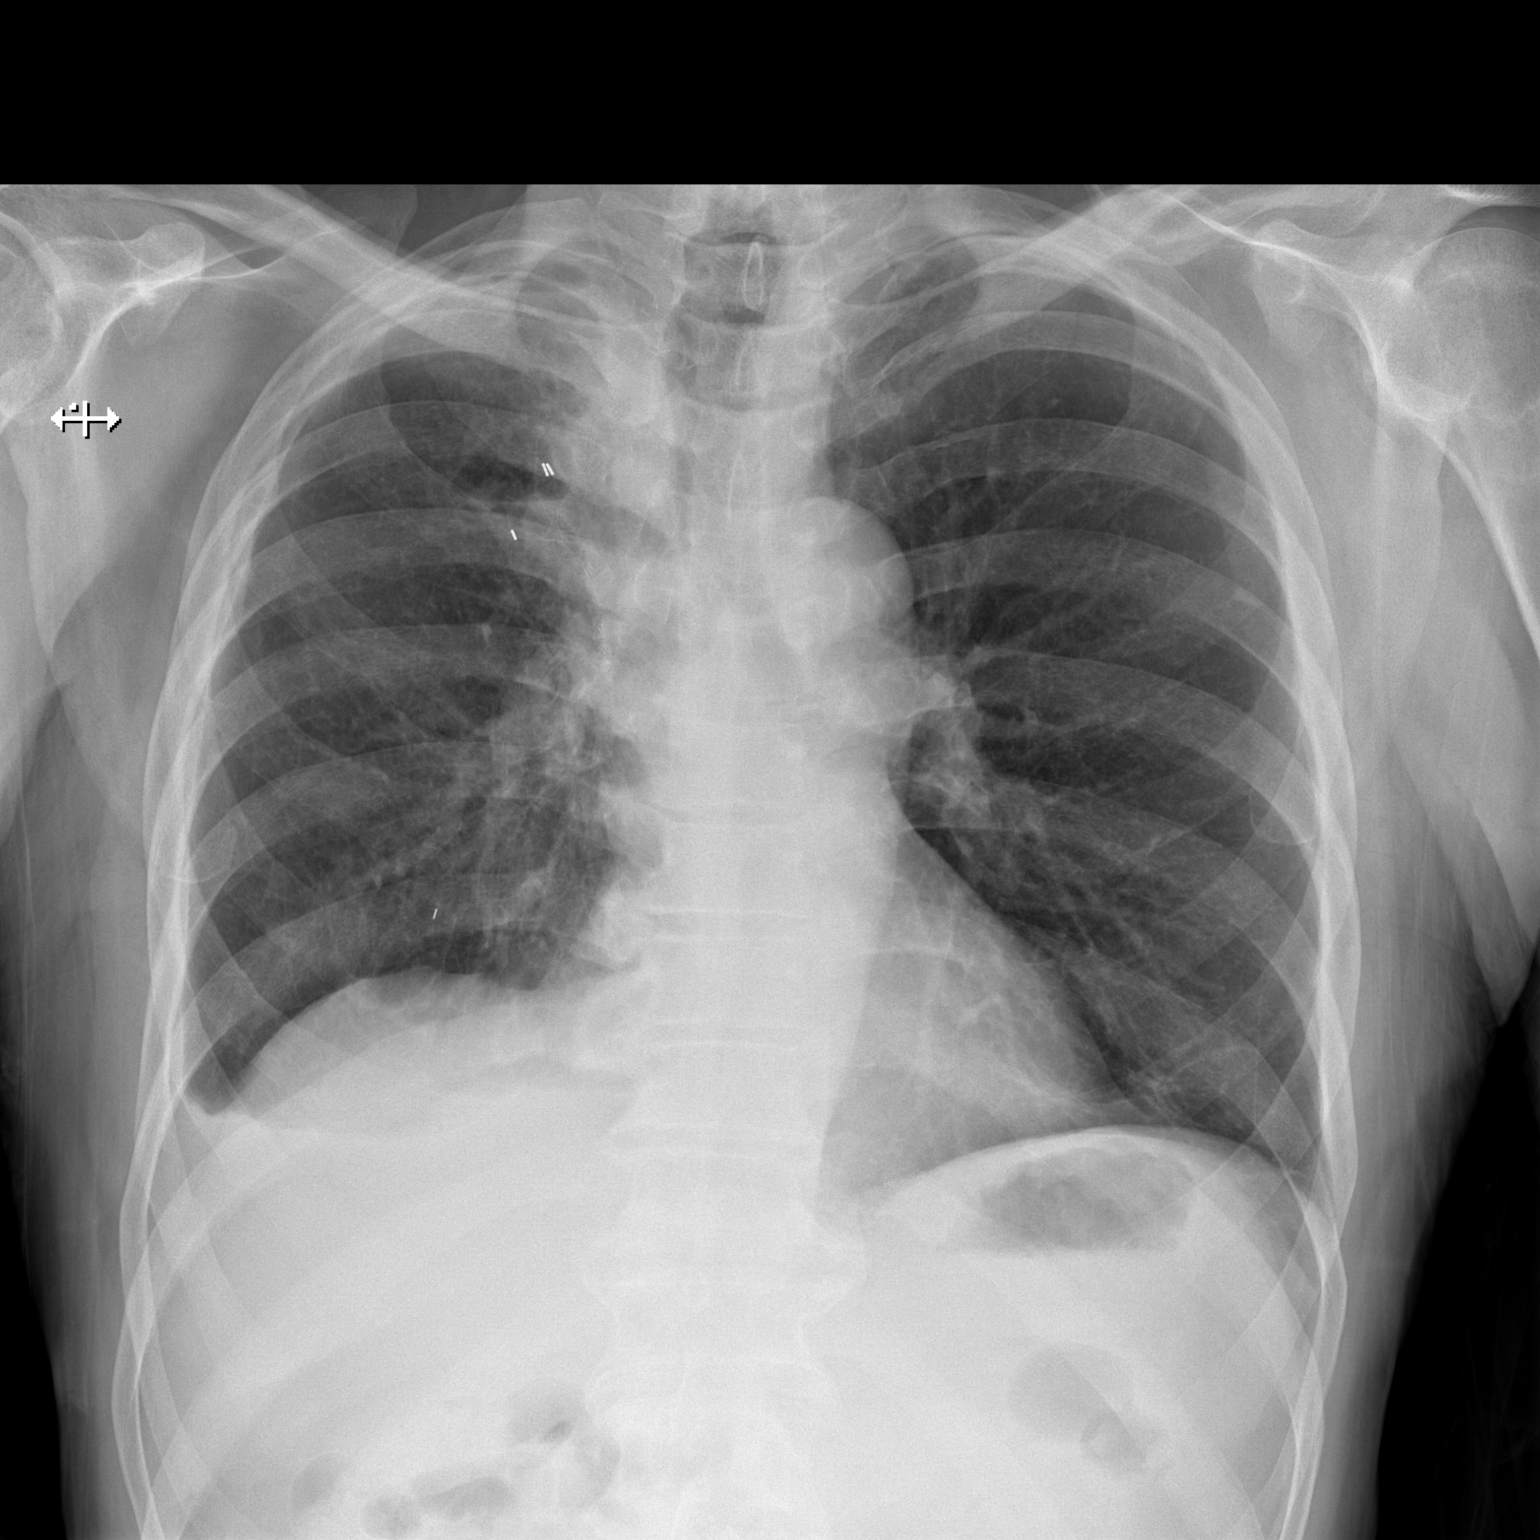

[w chest pa (2 of 2)]
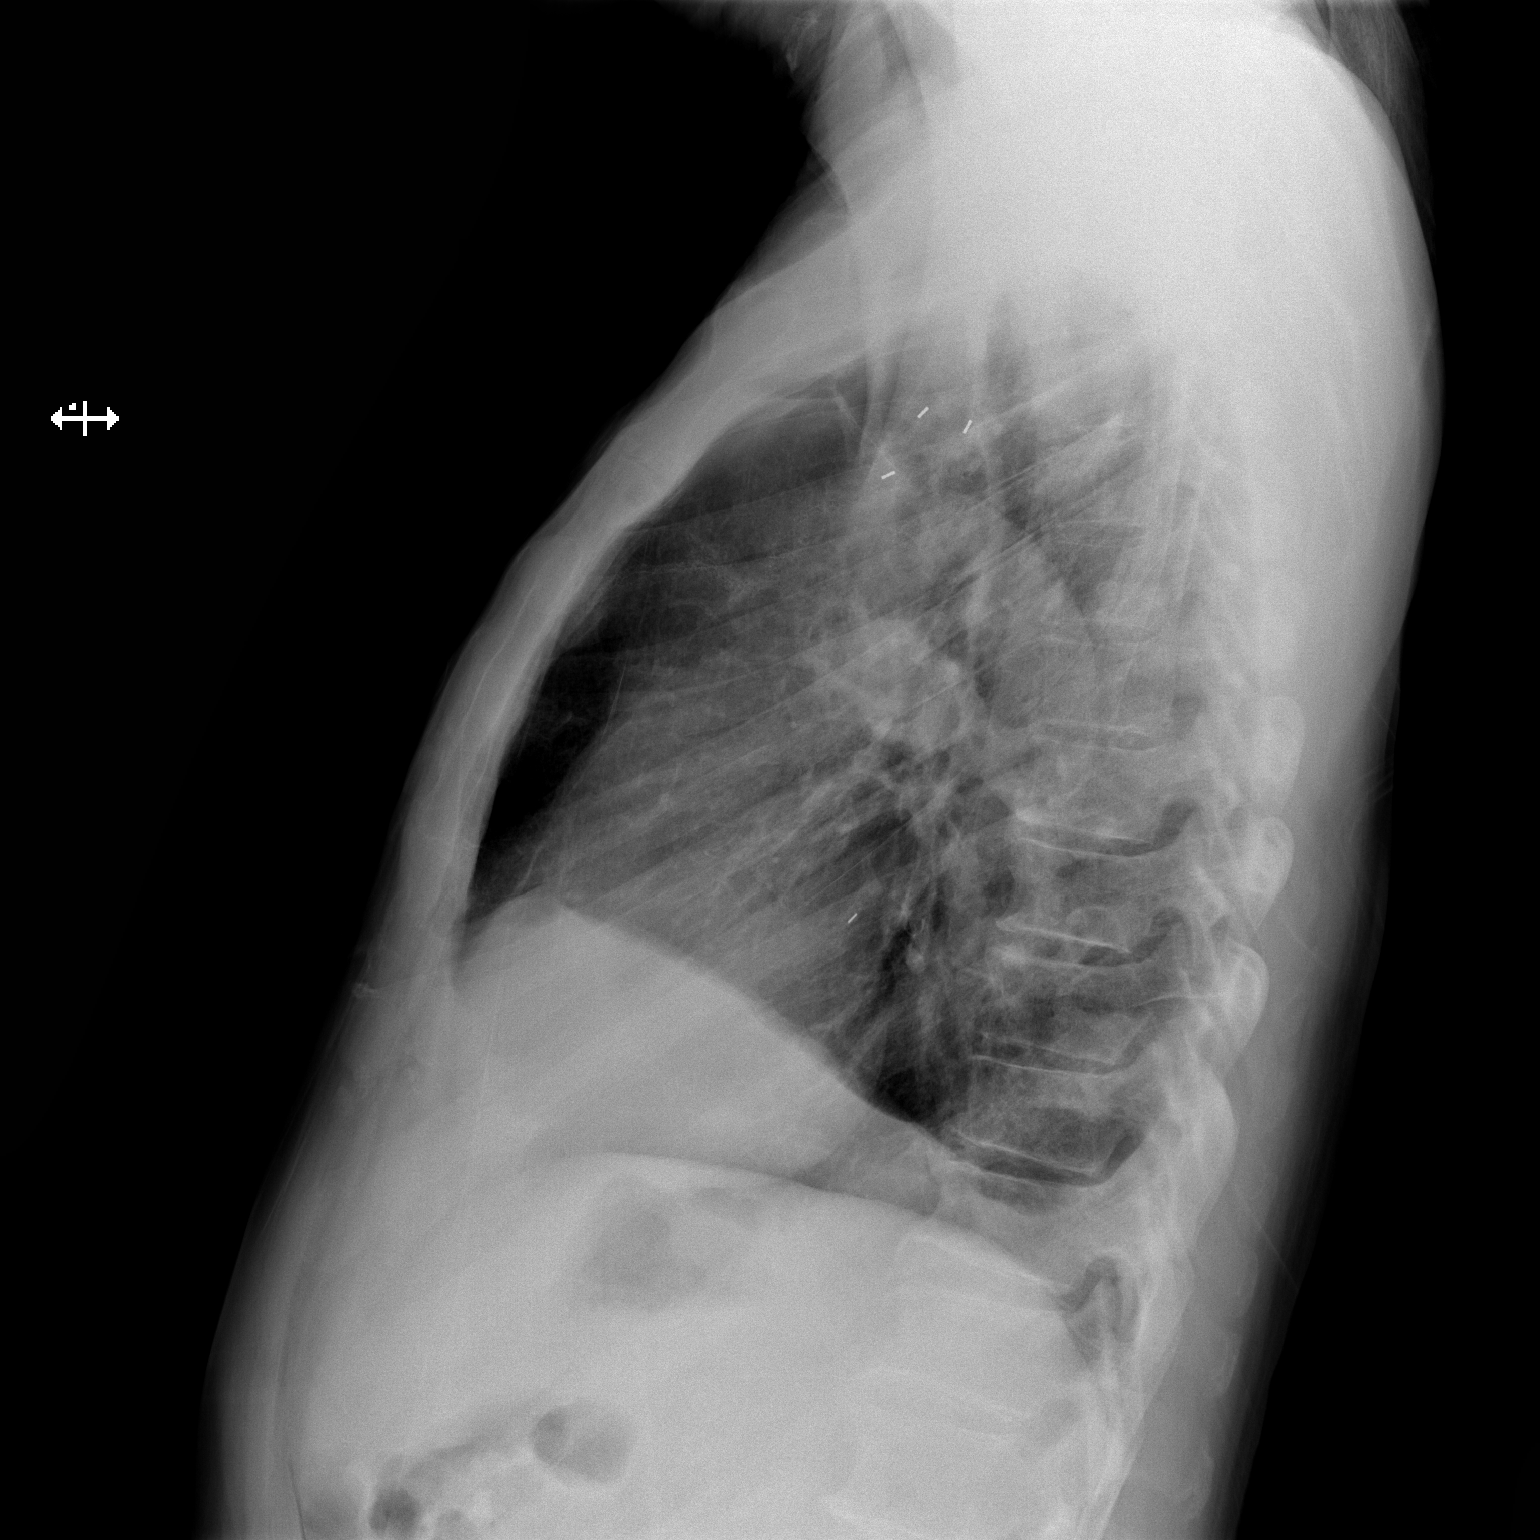

[2 of 2 positions shown; findings below may reference images not displayed]

FINDINGS: Surgical clips sutures right upper chest. Mild right upper anterior
hydropneumothorax versus cavitation again noted. No interim change.
Stable mediastinal prominence. No focal infiltrate. Low lung
volumes. Small right pleural effusion again noted. No acute bony
abnormality.
IMPRESSION: 1. Stable mild right upper anterior hydropneumothorax versus
cavitation again noted. No interim change. Stable mediastinal
prominence.

2. Low lung volumes. Stable small right pleural effusion. Chest is
unchanged from prior exam.

## 2018-03-20 IMAGING — DX DG CHEST 1V PORT
1 series · 1 of 1 positions shown · non-contrast
Comparison: 03/18/2017

CLINICAL DATA: Lung nodule

EXAM:
PORTABLE CHEST 1 VIEW

[chest]
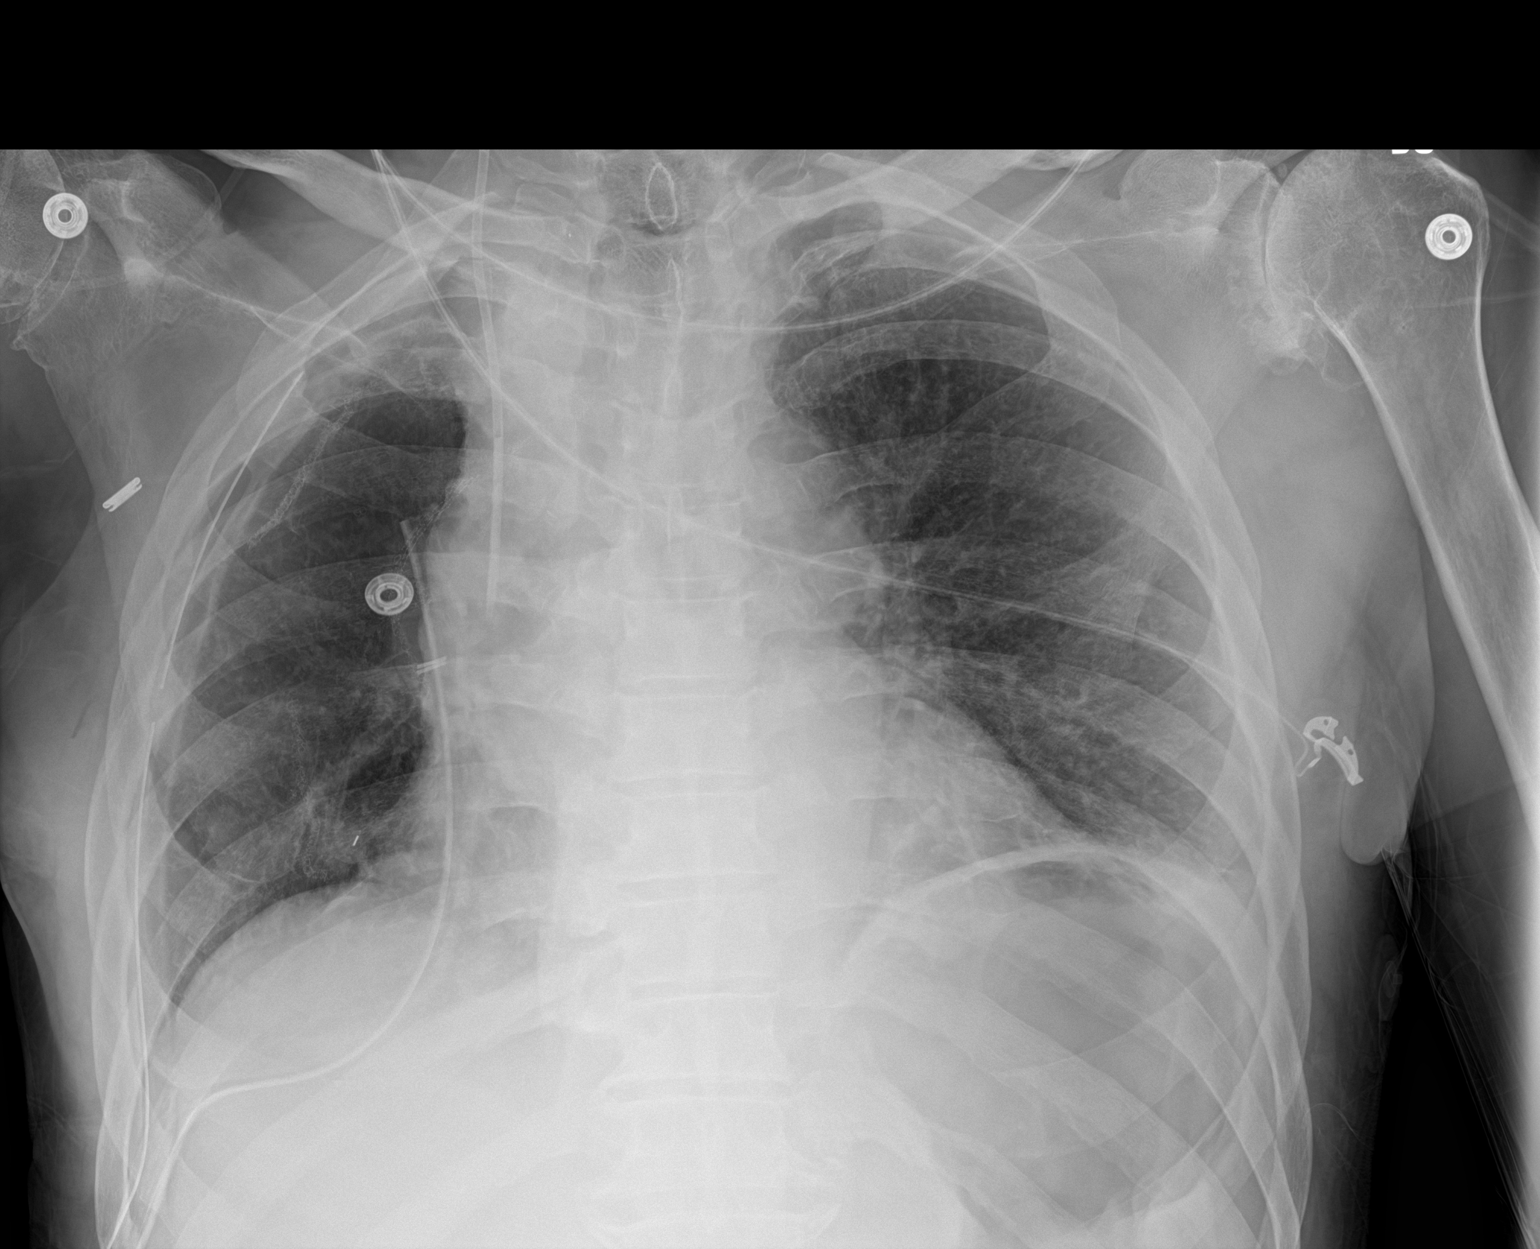

[1 of 1 positions shown; findings below may reference images not displayed]

FINDINGS: Postoperative chest. Two right-sided chest tubes are noted, one
along the periphery of the right hemithorax extending to the
posterolateral right third rib level and a second more medially
extending to the posterior right fifth rib level. The chain sutures
are seen in the right upper lung with volume loss. Right IJ central
line catheter is noted terminating in the distal SVC. No appreciable
pneumothorax. Mild vascular congestion noted of the left lung.
Minimal atelectasis is also seen the left lung base. Heart size is
mildly enlarged likely due to portable semi upright technique.
Osteoarthritis of the AC and glenohumeral joints bilaterally.
IMPRESSION: Postoperative appearance of the right lung with volume loss, support
lines and tubes as above. Interstitial edema of the left lung with
atelectasis at the left base.

## 2018-03-21 IMAGING — DX DG CHEST 1V PORT
1 series · 1 of 1 positions shown · non-contrast
Comparison: 03/21/2017.

CLINICAL DATA: VATS.  Right lung wedge resection.

EXAM:
PORTABLE CHEST 1 VIEW

[chest ap]
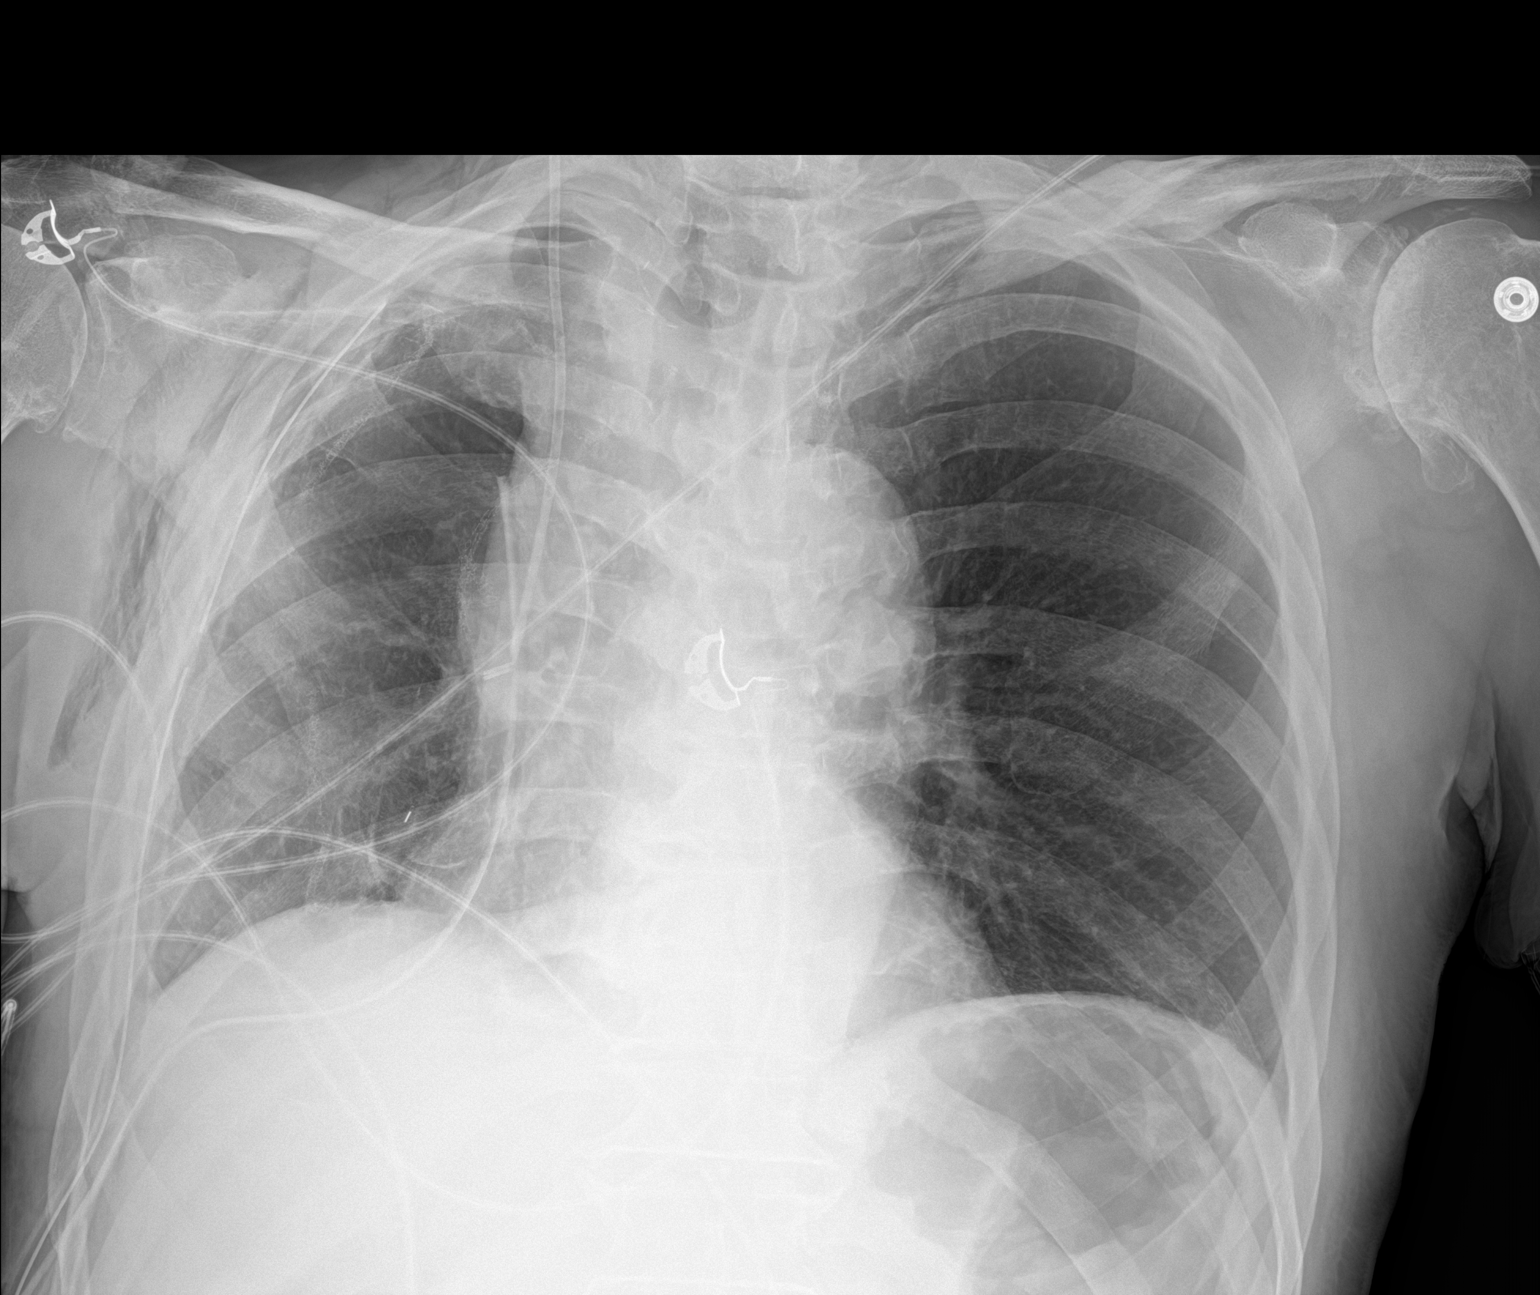

[1 of 1 positions shown; findings below may reference images not displayed]

FINDINGS: Right IJ line and right chest tubes in stable position. Tiny left
apical pneumothorax noted on today's exam. Postsurgical changes
right lung again noted. No acute infiltrates. Heart size stable.
Right chest wall and neck subcutaneous emphysema noted on today's
exam.
IMPRESSION: 1. Right IJ line right chest tube in stable position. Tiny right
apical pneumothorax on today's exam. Right chest wall and neck
subcutaneous emphysema noted on today's exam.

2.  Postsurgical changes right lung.

Critical Value/emergent results were called by telephone at the time
of interpretation on 03/22/2017 at [DATE] to nurse Dumree, who
verbally acknowledged these results.

## 2018-04-03 ENCOUNTER — Other Ambulatory Visit: Payer: Self-pay | Admitting: Thoracic Surgery (Cardiothoracic Vascular Surgery)

## 2018-04-03 DIAGNOSIS — R911 Solitary pulmonary nodule: Secondary | ICD-10-CM

## 2018-04-03 DIAGNOSIS — C3490 Malignant neoplasm of unspecified part of unspecified bronchus or lung: Secondary | ICD-10-CM

## 2018-04-03 DIAGNOSIS — C3411 Malignant neoplasm of upper lobe, right bronchus or lung: Secondary | ICD-10-CM

## 2018-06-08 ENCOUNTER — Other Ambulatory Visit: Payer: Self-pay

## 2018-06-08 ENCOUNTER — Ambulatory Visit
Admission: RE | Admit: 2018-06-08 | Discharge: 2018-06-08 | Disposition: A | Discharge status: Home / Self Care | Payer: Medicare Other | Source: Ambulatory Visit | Attending: Thoracic Surgery (Cardiothoracic Vascular Surgery) | Admitting: Thoracic Surgery (Cardiothoracic Vascular Surgery)

## 2018-06-08 DIAGNOSIS — C3490 Malignant neoplasm of unspecified part of unspecified bronchus or lung: Secondary | ICD-10-CM

## 2018-06-13 ENCOUNTER — Ambulatory Visit (INDEPENDENT_AMBULATORY_CARE_PROVIDER_SITE_OTHER): Payer: Medicare Other | Admitting: Thoracic Surgery (Cardiothoracic Vascular Surgery)

## 2018-06-13 ENCOUNTER — Other Ambulatory Visit: Payer: Self-pay

## 2018-06-13 ENCOUNTER — Other Ambulatory Visit: Payer: Self-pay | Admitting: *Deleted

## 2018-06-13 ENCOUNTER — Encounter: Payer: Self-pay | Admitting: Thoracic Surgery (Cardiothoracic Vascular Surgery)

## 2018-06-13 VITALS — BP 138/83 | HR 77 | Temp 97.7°F | Resp 16 | Ht 74.0 in | Wt 150.0 lb

## 2018-06-13 DIAGNOSIS — Z902 Acquired absence of lung [part of]: Secondary | ICD-10-CM

## 2018-06-13 DIAGNOSIS — J90 Pleural effusion, not elsewhere classified: Secondary | ICD-10-CM

## 2018-06-13 DIAGNOSIS — C3411 Malignant neoplasm of upper lobe, right bronchus or lung: Secondary | ICD-10-CM

## 2018-06-13 NOTE — Progress Notes (Signed)
[address]       [place] [address]             [phone]      HPI: 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  I did a thoracoscopic right upper lobectomy and node dissection in March 2019.  He had severe adhesions and incomplete fissures.  He had a complicated postoperative course.  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  He refused an oncology referral and so I have been following him.  At his last visit his appetite was poor him I gave him a prescription for Megace.  He did not seeing any difference with that so he stopped taking it.  His appetite remains poor.  He has not had any recent respiratory issues.  Current Outpatient Medications  Medication Sig Dispense Refill  . acetaminophen (TYLENOL) 500 MG tablet Take 1,000 mg by mouth every 8 (eight) hours as needed for mild pain or headache.    . allopurinol (ZYLOPRIM) 300 MG tablet Take 300 mg by mouth daily.     Marland Kitchen amLODipine (NORVASC) 10 MG tablet Take 10 mg by mouth daily.     Marland Kitchen aspirin EC 81 MG tablet Take 81 mg by mouth daily.    Marland Kitchen atenolol (TENORMIN) 100 MG tablet Take 100 mg by mouth daily.     . Cholecalciferol 2000 units CAPS Take 2,000 Units by mouth daily.    . hydrochlorothiazide (MICROZIDE) 12.5 MG capsule Take 12.5 mg by mouth daily.    . megestrol (MEGACE) 40 MG tablet Take 2 tablets (80 mg total) by mouth daily. 30 tablet 3   No current facility-administered medications for this visit.     Physical Exam BP 138/83 (BP Location: Right Arm, Patient Position: Sitting, Cuff Size: Normal)   Pulse 77   Temp 97.7 F (36.5 C) (Skin)   Resp 16   Ht 6\' 2"  (1.88 m)   Wt 150 lb (68 kg)   SpO2 98% Comment: RA  BMI 19.57 kg/m  78 year old man in no acute distress Alert and oriented x3  with no focal deficits No cervical or supraclavicular adenopathy Diminished breath sounds at right base, otherwise clear Cardiac regular rate and rhythm  Diagnostic Tests: CT CHEST WITHOUT CONTRAST  TECHNIQUE: Multidetector CT imaging of the chest was performed following the standard protocol without IV contrast.  COMPARISON:  11/15/2017  FINDINGS: Cardiovascular: Normal heart size. No pericardial effusion identified. Aortic atherosclerosis. Left main, lad, left circumflex coronary artery calcifications.  Mediastinum/Nodes: Normal appearance of the thyroid gland. No supraclavicular or axillary adenopathy. Index right hilar lymph node measures 1 cm, image 56/2. Previously this measured the same. Index pre-vascular lymph node measures 1 cm, image 64/2. Unchanged. The index subcarinal lymph node measures 1.2 cm, image 68/2. Previously 1.1 cm.  Lungs/Pleura: Postsurgical change in volume loss within the right hemithorax is again noted. There is a moderate size loculated right pleural effusion which is increased in volume from previous exam. This measures 10.0 x 4.5 by 12.3 cm (volume = 290 cm^3). On the previous exam this measured 8.8 x 2.4 by 13.0 cm (volume = 140 cm^3). Loculated scratch set chronic loculated pneumothorax overlying the anterior right lung is again noted. This measures 6.2 x 5.6 by 18.8 cm (volume =  340 cm^3). Previously 6.9 x 6.6 by 21.3 cm (volume = 510 cm^3). There is no suspicious pulmonary nodule or mass within the right hemithorax to suggest residual or recurrent tumor. Scattered tree-in-bud nodules are identified throughout the left lung, similar. 3 mm nodule in the left upper lobe is stable, image 27/8. 3 mm left upper lobe lung nodule, image 50/8. Also the 4 mm left upper lobe lung nodule is stable, image 71/8.  Upper Abdomen: Normal appearance of the adrenal glands. No acute findings within the visualized portions of the upper abdomen.   Musculoskeletal: No chest wall mass or suspicious bone lesions identified.  IMPRESSION: 1. The large loculated right-sided pneumothorax has decreased in volume from previous exam. Currently 340 cc versus 510 cc previously. 2. The loculated right pleural effusion has increased in volume from previous exam. 290 cc currently versus 140 cc previously. 3. No suspicious pulmonary nodule or mass identified to suggest residual/recurrent disease. 4. Aortic Atherosclerosis (ICD10-I70.0). Coronary artery calcifications.   Electronically Signed   By: Kerby Moors M.D.   On: 06/08/2018 13:40 I personally reviewed the CT images and concur with the findings noted above  Impression: Gurjit Mistry is a 78 year old gentleman with a history of tobacco abuse and COPD.  He underwent a right upper lobectomy and node dissection for a stage IA squamous cell carcinoma in March 2019.  He had a complicated postoperative course, but ultimately recovered and did well.  On his CT today he still has a space superiorly and anteriorly.  He does have a little more pleural fluid than he had back in November.  This may just be the evolution of the postoperative chest, but we need to make sure that it is not a malignant effusion.  I recommended that we do an ultrasound-guided thoracentesis to drain the effusion.  We will have the fluid sent for cytology.  He is agreeable to that procedure.  Plan: Ultrasound guided thoracentesis with fluid for cytology Return in 2 weeks to discuss results  [name], MD [facility] [phone]

## 2018-06-14 ENCOUNTER — Other Ambulatory Visit (HOSPITAL_COMMUNITY): Payer: Medicare Other

## 2018-06-16 ENCOUNTER — Ambulatory Visit (HOSPITAL_COMMUNITY): Payer: Medicare Other

## 2018-06-16 ENCOUNTER — Other Ambulatory Visit (HOSPITAL_COMMUNITY)
Admission: RE | Admit: 2018-06-16 | Discharge: 2018-06-16 | Disposition: A | Discharge status: Home / Self Care | Payer: Medicare Other | Source: Ambulatory Visit | Attending: Thoracic Surgery (Cardiothoracic Vascular Surgery) | Admitting: Thoracic Surgery (Cardiothoracic Vascular Surgery)

## 2018-06-16 DIAGNOSIS — Z1159 Encounter for screening for other viral diseases: Secondary | ICD-10-CM | POA: Diagnosis not present

## 2018-06-16 DIAGNOSIS — Z01812 Encounter for preprocedural laboratory examination: Secondary | ICD-10-CM | POA: Insufficient documentation

## 2018-06-16 DIAGNOSIS — J90 Pleural effusion, not elsewhere classified: Secondary | ICD-10-CM

## 2018-06-17 LAB — NOVEL CORONAVIRUS, NAA (HOSP ORDER, SEND-OUT TO REF LAB; TAT 18-24 HRS): SARS-CoV-2, NAA: NOT DETECTED

## 2018-06-19 ENCOUNTER — Ambulatory Visit (HOSPITAL_COMMUNITY)
Admission: RE | Admit: 2018-06-19 | Discharge: 2018-06-19 | Disposition: A | Discharge status: Home / Self Care | Payer: Medicare Other | Source: Ambulatory Visit | Attending: Thoracic Surgery (Cardiothoracic Vascular Surgery) | Admitting: Thoracic Surgery (Cardiothoracic Vascular Surgery)

## 2018-06-19 ENCOUNTER — Encounter (HOSPITAL_COMMUNITY): Payer: Self-pay | Admitting: Student

## 2018-06-19 ENCOUNTER — Other Ambulatory Visit (HOSPITAL_COMMUNITY): Payer: Medicare Other

## 2018-06-19 ENCOUNTER — Other Ambulatory Visit (HOSPITAL_COMMUNITY): Payer: Self-pay | Admitting: Thoracic Surgery (Cardiothoracic Vascular Surgery)

## 2018-06-19 ENCOUNTER — Ambulatory Visit (HOSPITAL_COMMUNITY): Admission: RE | Admit: 2018-06-19 | Payer: Medicare Other | Source: Ambulatory Visit

## 2018-06-19 ENCOUNTER — Other Ambulatory Visit: Payer: Self-pay

## 2018-06-19 ENCOUNTER — Other Ambulatory Visit (HOSPITAL_COMMUNITY): Payer: Self-pay | Admitting: Student

## 2018-06-19 ENCOUNTER — Ambulatory Visit (HOSPITAL_COMMUNITY)
Admission: RE | Admit: 2018-06-19 | Discharge: 2018-06-19 | Disposition: A | Discharge status: Home / Self Care | Payer: Medicare Other | Source: Ambulatory Visit | Attending: Student | Admitting: Student

## 2018-06-19 DIAGNOSIS — J90 Pleural effusion, not elsewhere classified: Secondary | ICD-10-CM

## 2018-06-19 DIAGNOSIS — Z9889 Other specified postprocedural states: Secondary | ICD-10-CM

## 2018-06-19 HISTORY — PX: IR THORACENTESIS ASP PLEURAL SPACE W/IMG GUIDE: IMG5380

## 2018-06-19 MED ORDER — LIDOCAINE HCL 1 % IJ SOLN
INTRAMUSCULAR | Status: DC | PRN
  Administered 2018-06-19: 10 mL

## 2018-06-19 MED ORDER — LIDOCAINE HCL 1 % IJ SOLN
INTRAMUSCULAR | Status: AC
  Filled 2018-06-19: qty 20

## 2018-06-19 NOTE — Procedures (Signed)
PROCEDURE SUMMARY:  Successful image-guided right thoracentesis. Yielded 200 milliliters of dark red fluid. Patient tolerated procedure well. No immediate complications. EBL = 5 mL.  Specimen was sent for labs. CXR ordered.  Earley Abide PA-C 06/19/2018 2:39 PM

## 2018-06-26 ENCOUNTER — Other Ambulatory Visit: Payer: Self-pay

## 2018-06-26 ENCOUNTER — Other Ambulatory Visit: Payer: Self-pay | Admitting: Thoracic Surgery (Cardiothoracic Vascular Surgery)

## 2018-06-26 DIAGNOSIS — C3411 Malignant neoplasm of upper lobe, right bronchus or lung: Secondary | ICD-10-CM

## 2018-06-27 ENCOUNTER — Ambulatory Visit (INDEPENDENT_AMBULATORY_CARE_PROVIDER_SITE_OTHER): Payer: Medicare Other | Admitting: Thoracic Surgery (Cardiothoracic Vascular Surgery)

## 2018-06-27 ENCOUNTER — Ambulatory Visit
Admission: RE | Admit: 2018-06-27 | Discharge: 2018-06-27 | Disposition: A | Discharge status: Home / Self Care | Payer: Medicare Other | Source: Ambulatory Visit | Attending: Thoracic Surgery (Cardiothoracic Vascular Surgery) | Admitting: Thoracic Surgery (Cardiothoracic Vascular Surgery)

## 2018-06-27 ENCOUNTER — Encounter: Payer: Self-pay | Admitting: Thoracic Surgery (Cardiothoracic Vascular Surgery)

## 2018-06-27 VITALS — BP 139/77 | HR 75 | Temp 97.9°F | Resp 16 | Ht 74.0 in | Wt 151.8 lb

## 2018-06-27 DIAGNOSIS — J90 Pleural effusion, not elsewhere classified: Secondary | ICD-10-CM

## 2018-06-27 DIAGNOSIS — C3411 Malignant neoplasm of upper lobe, right bronchus or lung: Secondary | ICD-10-CM

## 2018-06-27 DIAGNOSIS — C3491 Malignant neoplasm of unspecified part of right bronchus or lung: Secondary | ICD-10-CM | POA: Diagnosis not present

## 2018-06-27 DIAGNOSIS — Z902 Acquired absence of lung [part of]: Secondary | ICD-10-CM | POA: Diagnosis not present

## 2018-06-27 NOTE — Progress Notes (Signed)
[address]       [place] [address]             [phone]     HPI: [name] returns to discuss the results of his thoracentesis.  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  He had a thoracoscopic right upper lobectomy and node dissection in March 2019.  He refused an oncology referral and I have been following him since then.  I saw him earlier this month.  His CT of the chest showed slight increase in a pleural effusion compared to his previous CT scan.  We had a thoracentesis done by IR.  They drained about 200 mL of bloody fluid.  He thinks his breathing is a little better since the drainage.   Past Medical History:  Diagnosis Date  . Asthma    as child  . Cancer ([facility])    lung cancer  . Chronic renal insufficiency    NEPHROLOGIST-  DR Justin Mend -- LAST VISIT MARCH 2014  . Dyspnea   . GERD (gastroesophageal reflux disease)   . Gout   . Gouty arthritis    ALL JOINTS  . History of kidney stones   . History of prostate cancer    S/P RADIACTIVE SEED IMPLANTS  . Hydronephrosis, bilateral   . Hypertension   . 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 Pernicious anemia FOLLOWED BY DR WEBB   x3 feraheme injection --- last one 03-22-2012  . Urethral stricture      Current Outpatient Medications  Medication Sig Dispense Refill  . acetaminophen (TYLENOL) 500 MG tablet Take 1,000 mg by mouth every 8 (eight) hours as needed for mild pain or headache.    . allopurinol (ZYLOPRIM) 300 MG tablet Take 300 mg by mouth daily.     Marland Kitchen amLODipine (NORVASC) 10 MG tablet Take 10 mg by mouth daily.     Marland Kitchen aspirin EC 81 MG tablet Take 81 mg by mouth daily.    Marland Kitchen atenolol (TENORMIN) 100 MG tablet Take 100 mg by mouth daily.     .  Cholecalciferol 2000 units CAPS Take 2,000 Units by mouth daily.    . hydrochlorothiazide (MICROZIDE) 12.5 MG capsule Take 12.5 mg by mouth daily.    . megestrol (MEGACE) 40 MG tablet Take 2 tablets (80 mg total) by mouth daily. 30 tablet 3   No current facility-administered medications for this visit.     Physical Exam BP 139/77 (BP Location: Right Arm, Patient Position: Sitting, Cuff Size: Normal)   Pulse 75   Temp 97.9 F (36.6 C) (Other (Comment)) Comment (Src): thermal  Resp 16   Ht 6\' 2"  (1.88 m)   Wt 151 lb 12.8 oz (68.9 kg)   SpO2 98% Comment: RA  BMI 19.70 kg/m  78 year old man in no acute distress Alert and oriented x3 with no focal deficits Lungs absent breath sounds right base, otherwise clear Cardiac regular rate and rhythm, normal S1 and S2  Diagnostic Tests: CHEST - 2 VIEW  COMPARISON:  06/19/2018  Correlation CT chest 06/08/2018  FINDINGS: Volume loss in the RIGHT hemithorax of carina mediastinal shift to the RIGHT.  Postsurgical changes from RIGHT lung resection.  Normal heart size, mediastinal contours, and pulmonary vascularity  otherwise seen.  Probably kg lead projects over chest.  Mild RIGHT pleural effusion.  No acute infiltrate.  Probable loculated anterior RIGHT pneumothorax, decreased from prior CT.  Bones unremarkable.  IMPRESSION: Small RIGHT pleural effusion and decrease in loculated anterior RIGHT pneumothorax since prior CT exam.   Electronically Signed   By: Lavonia Dana M.D.   On: 06/27/2018 13:33 Diagnosis PLEURAL FLUID, RIGHT (SPECIMEN 1 OF 1, COLLECTED 06/19/18): NO MALIGNANT CELLS IDENTIFIED. Claudette Laws MD Pathologist, Electronic Signature (Case signed 06/20/2018)  I personally reviewed the chest x-ray images and concur with the findings noted above  Impression: [name] is a 78 year old gentleman who is a little over a year out from a thoracoscopic right upper lobectomy for stage I a squamous cell  carcinoma.  His CT a couple of weeks ago showed a slight increase in the size of a right pleural effusion.  He does still have a space loculated anteriorly as well.  Thoracentesis of the pleural effusion showed no evidence of malignancy.  Follow-up chest x-ray today is essentially unchanged.  No further intervention indicated at this time.  Plan: Return in 6 months with CT of chest  [name], MD [facility] [phone]

## 2018-10-28 ENCOUNTER — Encounter (INDEPENDENT_AMBULATORY_CARE_PROVIDER_SITE_OTHER): Payer: Self-pay

## 2019-01-23 ENCOUNTER — Other Ambulatory Visit: Payer: Self-pay | Admitting: *Deleted

## 2019-01-23 DIAGNOSIS — C3491 Malignant neoplasm of unspecified part of right bronchus or lung: Secondary | ICD-10-CM

## 2019-02-01 ENCOUNTER — Ambulatory Visit
Admission: RE | Admit: 2019-02-01 | Discharge: 2019-02-01 | Disposition: A | Discharge status: Home / Self Care | Payer: Medicare Other | Source: Ambulatory Visit | Attending: Thoracic Surgery (Cardiothoracic Vascular Surgery) | Admitting: Thoracic Surgery (Cardiothoracic Vascular Surgery)

## 2019-02-01 DIAGNOSIS — C3491 Malignant neoplasm of unspecified part of right bronchus or lung: Secondary | ICD-10-CM

## 2019-02-06 ENCOUNTER — Ambulatory Visit: Payer: Medicare Other | Admitting: Thoracic Surgery (Cardiothoracic Vascular Surgery)

## 2019-02-06 ENCOUNTER — Encounter: Payer: Self-pay | Admitting: Thoracic Surgery (Cardiothoracic Vascular Surgery)

## 2019-02-06 ENCOUNTER — Other Ambulatory Visit: Payer: Self-pay

## 2019-02-06 VITALS — BP 100/51 | HR 68 | Temp 97.5°F | Resp 16 | Ht 74.0 in | Wt 140.0 lb

## 2019-02-06 DIAGNOSIS — C3411 Malignant neoplasm of upper lobe, right bronchus or lung: Secondary | ICD-10-CM | POA: Diagnosis not present

## 2019-02-06 DIAGNOSIS — Z902 Acquired absence of lung [part of]: Secondary | ICD-10-CM | POA: Diagnosis not present

## 2019-02-06 NOTE — Progress Notes (Signed)
[address]       [place] [address]             [phone]     HPI: 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  He had a thoracoscopic right upper lobectomy in March 2019.  He had severe adhesions and incomplete fissures.  His postoperative course was complicated and ended up with an apical space.  He refused an oncology referral so I been following him ever since.  I last saw him in June.  At that time he had a basilar pleural effusion that appeared slightly larger.  Thoracentesis was performed.  Cytology was negative.  He says he has been feeling pretty well.  He has not had any acute respiratory issues since his last visit.  He has been staying isolated and using a mask.  Past Medical History:  Diagnosis Date  . Asthma    as child  . Cancer ([place])    lung cancer  . Chronic renal insufficiency    NEPHROLOGIST-  DR Justin Mend -- LAST VISIT MARCH 2014  . Dyspnea   . GERD (gastroesophageal reflux disease)   . Gout   . Gouty arthritis    ALL JOINTS  . History of kidney stones   . History of prostate cancer    S/P RADIACTIVE SEED IMPLANTS  . Hydronephrosis, bilateral   . Hypertension   . 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 Pernicious anemia FOLLOWED BY DR WEBB   x3 feraheme injection --- last one 03-22-2012  . Urethral stricture     Current Outpatient Medications  Medication Sig Dispense Refill  . acetaminophen (TYLENOL) 500 MG tablet Take 1,000 mg by mouth every 8 (eight) hours as needed for mild pain or headache.    . allopurinol (ZYLOPRIM) 300 MG tablet Take 300 mg by mouth daily.     Marland Kitchen amLODipine (NORVASC)  10 MG tablet Take 10 mg by mouth daily.     Marland Kitchen aspirin EC 81 MG tablet Take 81 mg by mouth daily.    Marland Kitchen atenolol (TENORMIN) 100 MG tablet Take 100 mg by mouth daily.     . Cholecalciferol 2000 units CAPS Take 2,000 Units by mouth daily.    . hydrochlorothiazide (MICROZIDE) 12.5 MG capsule Take 12.5 mg by mouth daily.    . megestrol (MEGACE) 40 MG tablet Take 2 tablets (80 mg total) by mouth daily. 30 tablet 3   No current facility-administered medications for this visit.    Physical Exam BP (!) 100/51 (BP Location: Right Arm, Patient Position: Sitting, Cuff Size: Normal)   Pulse 68   Temp (!) 97.5 F (36.4 C)   Resp 16   Ht 6\' 2"  (1.88 m)   Wt 140 lb (63.5 kg)   SpO2 96% Comment: RA  BMI 17.97 kg/m  Thin 79 year old man in no acute distress Alert and oriented x3 with no focal neurologic deficit Cardiac regular rate and rhythm Lungs diminished breath sounds bilaterally, absent breath sounds at right base Incisions well-healed No cervical or supraclavicular adenopathy  Diagnostic Tests: CT CHEST WITHOUT CONTRAST  TECHNIQUE: Multidetector CT imaging of the chest was performed following the standard protocol  without IV contrast.  COMPARISON:  Chest radiograph 06/27/2018. Most recent CT 06/08/2018.  FINDINGS: Cardiovascular: Bovine arch. Aortic atherosclerosis. Tortuous thoracic aorta. Normal heart size, without pericardial effusion. Multivessel coronary artery atherosclerosis. Pulmonary artery enlargement, outflow tract 3.1 cm.  Mediastinum/Nodes: Prominent right paratracheal nodes are similar. index 9 mm node on 50/2 is not significantly changed. Not pathologic by size criteria. Hilar regions poorly evaluated without intravenous contrast. Esophageal fluid level on 78/2.  Prevascular nodes of up to 7 mm are similar.  Lungs/Pleura: The loculated anterior right-sided pleural collection is slightly decreased. Currently approximately half fluid and  half gas.  Loculated inferior right pleural fluid and thickening, slightly decreased.  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 Status post right upper lobectomy.  Right lower lobe dependent atelectasis or scarring.  Superior segment right lower lobe wedge resection.  Redemonstration of left greater than right upper lung ill-defined "tree in bud" nodularity. Left upper lobe 4 mm nodule is similar on 71/8.  Upper Abdomen: Normal imaged portions of the liver, spleen, stomach, pancreas, right adrenal gland, kidneys. Left adrenal thickening with maintenance of adreniform shape. Gallstones or sludge.  Musculoskeletal: Degenerative changes of the left shoulder.  IMPRESSION: 1. 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 Decrease in right-sided hydropneumothorax with loculated component anteriorly. 2. Upper normal thoracic nodes, similar. No specific findings to suggest metastatic disease. 3. Upper lung predominant tree-in-bud nodularity is similar, likely the sequelae of prior atypical infectious bronchiolitis. 4. Pulmonary artery enlargement suggests pulmonary arterial hypertension. 5. Coronary artery atherosclerosis. Aortic Atherosclerosis (ICD10-I70.0). 6. Esophageal air fluid level suggests dysmotility or gastroesophageal reflux.   Electronically Signed   By: Abigail Miyamoto M.D.   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  Stage Ia non-small cell carcinoma right upper lobe-status post right upper lobectomy.  Now 2 years out from surgery.  CT shows nothing to suggest recurrent  or metastatic disease.  At this point guidelines suggest annual CT follow-up.  Tobacco abuse-quit smoking in 2019.  Plan: Return in 1 year with CT chest He knows to call if he has any issues in the interim.  [name], MD [facility] [phone]

## 2019-03-08 ENCOUNTER — Ambulatory Visit: Payer: Medicare Other | Attending: Internal Medicine

## 2019-03-08 DIAGNOSIS — Z23 Encounter for immunization: Secondary | ICD-10-CM | POA: Insufficient documentation

## 2019-03-08 NOTE — Progress Notes (Signed)
   Covid-19 Vaccination Clinic  Name:  KEVAN PROUTY    MRN: [id number] DOB: [birth year]  03/08/2019  Mr. Tullier was observed post Covid-19 immunization for 15 minutes without incident. He was provided with Vaccine Information Sheet and instruction to access the V-Safe system.   Mr. Bonet was instructed to call 911 with any severe reactions post vaccine: Marland Kitchen Difficulty breathing  . Swelling of face and throat  . A fast heartbeat  . A bad rash all over body  . Dizziness and weakness   Immunizations Administered    Name Date Dose VIS Date Route   Pfizer COVID-19 Vaccine 03/08/2019 12:38 PM 0.3 mL 12/15/2018 Intramuscular   Manufacturer: [place]   Lot: [id number]   [place]: [id number]

## 2019-03-28 DIAGNOSIS — N329 Bladder disorder, unspecified: Secondary | ICD-10-CM | POA: Diagnosis not present

## 2019-04-03 ENCOUNTER — Ambulatory Visit: Payer: Medicare HMO | Attending: Internal Medicine

## 2019-04-03 DIAGNOSIS — Z23 Encounter for immunization: Secondary | ICD-10-CM

## 2019-04-03 NOTE — Progress Notes (Signed)
   Covid-19 Vaccination Clinic  Name:  Jerry Wise    MRN: [id number] DOB: [birth year]  04/03/2019  [name] was observed post Covid-19 immunization for 15 minutes without incident. He was provided with Vaccine Information Sheet and instruction to access the V-Safe system.   [name] was instructed to call 911 with any severe reactions post vaccine: Marland Kitchen Difficulty breathing  . Swelling of face and throat  . A fast heartbeat  . A bad rash all over body  . Dizziness and weakness   Immunizations Administered    Name Date Dose VIS Date Route   Pfizer COVID-19 Vaccine 04/03/2019  3:41 PM 0.3 mL 12/15/2018 Intramuscular   Manufacturer: [employer]   Lot: [id number]   [place]: [id number]

## 2019-04-04 DIAGNOSIS — D649 Anemia, unspecified: Secondary | ICD-10-CM | POA: Diagnosis not present

## 2019-04-04 DIAGNOSIS — D510 Vitamin B12 deficiency anemia due to intrinsic factor deficiency: Secondary | ICD-10-CM | POA: Diagnosis not present

## 2019-04-04 DIAGNOSIS — N183 Chronic kidney disease, stage 3 unspecified: Secondary | ICD-10-CM | POA: Diagnosis not present

## 2019-04-04 DIAGNOSIS — I10 Essential (primary) hypertension: Secondary | ICD-10-CM | POA: Diagnosis not present

## 2019-04-04 DIAGNOSIS — C3410 Malignant neoplasm of upper lobe, unspecified bronchus or lung: Secondary | ICD-10-CM | POA: Diagnosis not present

## 2019-04-30 DIAGNOSIS — N320 Bladder-neck obstruction: Secondary | ICD-10-CM | POA: Diagnosis not present

## 2019-05-04 DIAGNOSIS — H524 Presbyopia: Secondary | ICD-10-CM | POA: Diagnosis not present

## 2019-05-04 DIAGNOSIS — Z0100 Encounter for examination of eyes and vision without abnormal findings: Secondary | ICD-10-CM | POA: Diagnosis not present

## 2019-06-15 DIAGNOSIS — N320 Bladder-neck obstruction: Secondary | ICD-10-CM | POA: Diagnosis not present

## 2019-07-04 DIAGNOSIS — D649 Anemia, unspecified: Secondary | ICD-10-CM | POA: Diagnosis not present

## 2019-07-04 DIAGNOSIS — D510 Vitamin B12 deficiency anemia due to intrinsic factor deficiency: Secondary | ICD-10-CM | POA: Diagnosis not present

## 2019-07-04 DIAGNOSIS — C3410 Malignant neoplasm of upper lobe, unspecified bronchus or lung: Secondary | ICD-10-CM | POA: Diagnosis not present

## 2019-07-04 DIAGNOSIS — I10 Essential (primary) hypertension: Secondary | ICD-10-CM | POA: Diagnosis not present

## 2019-07-04 DIAGNOSIS — J439 Emphysema, unspecified: Secondary | ICD-10-CM | POA: Diagnosis not present

## 2019-07-04 DIAGNOSIS — N183 Chronic kidney disease, stage 3 unspecified: Secondary | ICD-10-CM | POA: Diagnosis not present

## 2019-07-13 DIAGNOSIS — N320 Bladder-neck obstruction: Secondary | ICD-10-CM | POA: Diagnosis not present

## 2019-08-04 ENCOUNTER — Encounter (HOSPITAL_COMMUNITY): Payer: Self-pay | Admitting: Emergency Medicine

## 2019-08-04 ENCOUNTER — Emergency Department (HOSPITAL_COMMUNITY)
Admission: EM | Admit: 2019-08-04 | Discharge: 2019-08-05 | Disposition: A | Discharge status: Home / Self Care | Payer: Medicare HMO | Attending: Emergency Medicine | Admitting: Emergency Medicine

## 2019-08-04 ENCOUNTER — Emergency Department (HOSPITAL_COMMUNITY): Payer: Medicare HMO

## 2019-08-04 ENCOUNTER — Other Ambulatory Visit: Payer: Self-pay

## 2019-08-04 DIAGNOSIS — S0992XA Unspecified injury of nose, initial encounter: Secondary | ICD-10-CM | POA: Insufficient documentation

## 2019-08-04 DIAGNOSIS — Z5321 Procedure and treatment not carried out due to patient leaving prior to being seen by health care provider: Secondary | ICD-10-CM | POA: Diagnosis not present

## 2019-08-04 DIAGNOSIS — W010XXA Fall on same level from slipping, tripping and stumbling without subsequent striking against object, initial encounter: Secondary | ICD-10-CM | POA: Diagnosis not present

## 2019-08-04 DIAGNOSIS — Y939 Activity, unspecified: Secondary | ICD-10-CM | POA: Insufficient documentation

## 2019-08-04 DIAGNOSIS — Y92009 Unspecified place in unspecified non-institutional (private) residence as the place of occurrence of the external cause: Secondary | ICD-10-CM | POA: Insufficient documentation

## 2019-08-04 DIAGNOSIS — Y999 Unspecified external cause status: Secondary | ICD-10-CM | POA: Diagnosis not present

## 2019-08-04 NOTE — ED Triage Notes (Addendum)
Patient lost his balance and fell at home this evening injured his nose with skin tear and mild swelling . No LOC/ambulatory,+ETOH.

## 2019-08-05 NOTE — ED Notes (Addendum)
Pt was encouraged to stay and the risk. Pt cut stop bleeding. Pt said he could no longer wait.

## 2019-08-14 DIAGNOSIS — R338 Other retention of urine: Secondary | ICD-10-CM | POA: Diagnosis not present

## 2019-09-17 DIAGNOSIS — R338 Other retention of urine: Secondary | ICD-10-CM | POA: Diagnosis not present

## 2019-10-04 DIAGNOSIS — D510 Vitamin B12 deficiency anemia due to intrinsic factor deficiency: Secondary | ICD-10-CM | POA: Diagnosis not present

## 2019-10-04 DIAGNOSIS — I10 Essential (primary) hypertension: Secondary | ICD-10-CM | POA: Diagnosis not present

## 2019-10-04 DIAGNOSIS — N183 Chronic kidney disease, stage 3 unspecified: Secondary | ICD-10-CM | POA: Diagnosis not present

## 2019-10-04 DIAGNOSIS — C3410 Malignant neoplasm of upper lobe, unspecified bronchus or lung: Secondary | ICD-10-CM | POA: Diagnosis not present

## 2019-10-04 DIAGNOSIS — J439 Emphysema, unspecified: Secondary | ICD-10-CM | POA: Diagnosis not present

## 2019-10-04 DIAGNOSIS — D649 Anemia, unspecified: Secondary | ICD-10-CM | POA: Diagnosis not present

## 2019-10-17 DIAGNOSIS — N320 Bladder-neck obstruction: Secondary | ICD-10-CM | POA: Diagnosis not present

## 2019-11-02 DIAGNOSIS — N183 Chronic kidney disease, stage 3 unspecified: Secondary | ICD-10-CM | POA: Diagnosis not present

## 2019-11-02 DIAGNOSIS — C3410 Malignant neoplasm of upper lobe, unspecified bronchus or lung: Secondary | ICD-10-CM | POA: Diagnosis not present

## 2019-11-02 DIAGNOSIS — I10 Essential (primary) hypertension: Secondary | ICD-10-CM | POA: Diagnosis not present

## 2019-11-02 DIAGNOSIS — D649 Anemia, unspecified: Secondary | ICD-10-CM | POA: Diagnosis not present

## 2019-11-02 DIAGNOSIS — D510 Vitamin B12 deficiency anemia due to intrinsic factor deficiency: Secondary | ICD-10-CM | POA: Diagnosis not present

## 2019-11-02 DIAGNOSIS — J439 Emphysema, unspecified: Secondary | ICD-10-CM | POA: Diagnosis not present

## 2019-11-19 DIAGNOSIS — R338 Other retention of urine: Secondary | ICD-10-CM | POA: Diagnosis not present

## 2019-11-22 ENCOUNTER — Other Ambulatory Visit: Payer: Self-pay | Admitting: Thoracic Surgery (Cardiothoracic Vascular Surgery)

## 2019-11-22 DIAGNOSIS — C3411 Malignant neoplasm of upper lobe, right bronchus or lung: Secondary | ICD-10-CM

## 2019-12-14 ENCOUNTER — Other Ambulatory Visit: Payer: Medicare HMO

## 2019-12-17 DIAGNOSIS — N320 Bladder-neck obstruction: Secondary | ICD-10-CM | POA: Diagnosis not present

## 2019-12-18 ENCOUNTER — Ambulatory Visit: Payer: Medicare HMO | Admitting: Thoracic Surgery (Cardiothoracic Vascular Surgery)

## 2019-12-19 DIAGNOSIS — J439 Emphysema, unspecified: Secondary | ICD-10-CM | POA: Diagnosis not present

## 2019-12-19 DIAGNOSIS — K219 Gastro-esophageal reflux disease without esophagitis: Secondary | ICD-10-CM | POA: Diagnosis not present

## 2019-12-19 DIAGNOSIS — N183 Chronic kidney disease, stage 3 unspecified: Secondary | ICD-10-CM | POA: Diagnosis not present

## 2019-12-19 DIAGNOSIS — I10 Essential (primary) hypertension: Secondary | ICD-10-CM | POA: Diagnosis not present

## 2019-12-19 DIAGNOSIS — C3410 Malignant neoplasm of upper lobe, unspecified bronchus or lung: Secondary | ICD-10-CM | POA: Diagnosis not present

## 2019-12-19 DIAGNOSIS — D649 Anemia, unspecified: Secondary | ICD-10-CM | POA: Diagnosis not present

## 2019-12-19 DIAGNOSIS — D510 Vitamin B12 deficiency anemia due to intrinsic factor deficiency: Secondary | ICD-10-CM | POA: Diagnosis not present

## 2019-12-20 ENCOUNTER — Other Ambulatory Visit: Payer: Medicare HMO

## 2020-01-08 ENCOUNTER — Ambulatory Visit: Payer: Medicare HMO | Admitting: Thoracic Surgery (Cardiothoracic Vascular Surgery)

## 2020-01-09 DIAGNOSIS — N320 Bladder-neck obstruction: Secondary | ICD-10-CM | POA: Diagnosis not present

## 2020-02-12 ENCOUNTER — Other Ambulatory Visit: Payer: Medicare HMO

## 2020-02-12 ENCOUNTER — Ambulatory Visit
Admission: RE | Admit: 2020-02-12 | Discharge: 2020-02-12 | Disposition: A | Discharge status: Home / Self Care | Payer: Medicare HMO | Source: Ambulatory Visit | Attending: Thoracic Surgery (Cardiothoracic Vascular Surgery) | Admitting: Thoracic Surgery (Cardiothoracic Vascular Surgery)

## 2020-02-12 DIAGNOSIS — Z85118 Personal history of other malignant neoplasm of bronchus and lung: Secondary | ICD-10-CM | POA: Diagnosis not present

## 2020-02-12 DIAGNOSIS — Z8546 Personal history of malignant neoplasm of prostate: Secondary | ICD-10-CM | POA: Diagnosis not present

## 2020-02-12 DIAGNOSIS — R0600 Dyspnea, unspecified: Secondary | ICD-10-CM | POA: Diagnosis not present

## 2020-02-12 DIAGNOSIS — C3411 Malignant neoplasm of upper lobe, right bronchus or lung: Secondary | ICD-10-CM

## 2020-02-12 DIAGNOSIS — I2510 Atherosclerotic heart disease of native coronary artery without angina pectoris: Secondary | ICD-10-CM | POA: Diagnosis not present

## 2020-02-12 DIAGNOSIS — N320 Bladder-neck obstruction: Secondary | ICD-10-CM | POA: Diagnosis not present

## 2020-02-19 ENCOUNTER — Ambulatory Visit: Payer: Medicare HMO | Admitting: Thoracic Surgery (Cardiothoracic Vascular Surgery)

## 2020-02-19 ENCOUNTER — Encounter: Payer: Self-pay | Admitting: Thoracic Surgery (Cardiothoracic Vascular Surgery)

## 2020-02-19 ENCOUNTER — Other Ambulatory Visit: Payer: Self-pay

## 2020-02-19 VITALS — BP 120/74 | HR 90 | Temp 97.6°F | Resp 20 | Ht 74.0 in | Wt 135.0 lb

## 2020-02-19 DIAGNOSIS — C3491 Malignant neoplasm of unspecified part of right bronchus or lung: Secondary | ICD-10-CM

## 2020-02-19 DIAGNOSIS — Z902 Acquired absence of lung [part of]: Secondary | ICD-10-CM | POA: Diagnosis not present

## 2020-02-19 NOTE — Progress Notes (Signed)
[address]       [place] [address]             [phone]     HPI: Mr. 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  I did a thoracoscopic right upper lobectomy in March 2019.  He had superior lesions and incomplete fissures.  He ended up with an apical space postoperatively.  He refused an oncology referral so I been following him with CT scans since the time of surgery.  In June 2020 he had a basilar pleural effusion.  Thoracentesis was performed.  Cytology was negative.  I last saw him in February 2021.  He was doing reasonably well at that time.  Since I last saw him he has had considerable weight loss.  He has a chronic cough.  He says he occasionally sees white mucus.  No brown or yellow sputum.  No hemoptysis.  Denies fevers or chills.  Says his appetite is poor.  He does complain of feeling short of breath and wants to know if there are any medications I can give him for that.  Past Medical History:  Diagnosis Date  . Asthma    as child  . Cancer ([facility])    lung cancer  . Chronic renal insufficiency    NEPHROLOGIST-  DR Justin Mend -- LAST VISIT MARCH 2014  . Dyspnea   . GERD (gastroesophageal reflux disease)   . Gout   . Gouty arthritis    ALL JOINTS  . History of kidney stones   . History of prostate cancer    S/P RADIACTIVE SEED IMPLANTS  . Hydronephrosis, bilateral   . Hypertension   . 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 Pernicious anemia FOLLOWED BY DR WEBB   x3 feraheme injection --- last one 03-22-2012  . Urethral stricture     Current Outpatient Medications  Medication Sig Dispense Refill  . acetaminophen (TYLENOL) 500 MG tablet Take 1,000 mg by mouth  every 8 (eight) hours as needed for mild pain or headache.    . allopurinol (ZYLOPRIM) 300 MG tablet Take 300 mg by mouth daily.     Marland Kitchen amLODipine (NORVASC) 10 MG tablet Take 10 mg by mouth daily.     Marland Kitchen aspirin EC 81 MG tablet Take 81 mg by mouth daily.    Marland Kitchen atenolol (TENORMIN) 100 MG tablet Take 100 mg by mouth daily.     . Cholecalciferol 2000 units CAPS Take 2,000 Units by mouth daily.    . hydrochlorothiazide (MICROZIDE) 12.5 MG capsule Take 12.5 mg by mouth daily.    . megestrol (MEGACE) 40 MG tablet Take 2 tablets (80 mg total) by mouth daily. 30 tablet 3   No current facility-administered medications for this visit.    Physical Exam BP 120/74   Pulse 90   Temp 97.6 F (36.4 C) (Skin)   Resp 20   Ht 6\' 2"  (1.88 m)   Wt 135 lb (61.2 kg)   SpO2 95% Comment: RA  BMI 17.33 kg/m  Cachetic 80 year old man in no acute distress Alert and oriented x3 with no focal deficits Lungs diminished breath sounds on right, faint wheeze on left Cardiac regular rate and rhythm No  cervical or supraclavicular adenopathy  Diagnostic Tests: CT CHEST WITHOUT CONTRAST  TECHNIQUE: Multidetector CT imaging of the chest was performed following the standard protocol without IV contrast.  COMPARISON:  02/01/2019 chest CT.  FINDINGS: Cardiovascular: Normal heart size. No significant pericardial effusion/thickening. Three-vessel coronary atherosclerosis. Atherosclerotic nonaneurysmal thoracic aorta. Top-normal caliber main pulmonary artery (3.3 cm diameter).  Mediastinum/Nodes: No discrete thyroid nodules. Unremarkable esophagus. No axillary adenopathy. Mildly enlarged 1.2 cm right paratracheal node (series 2/image 60), previously 1.2 cm using similar measurement technique, stable. No new pathologically enlarged mediastinal nodes.  Lungs/Pleura: No pneumothorax. No pleural effusion. Stable postsurgical changes from right upper lobectomy and partial right middle lobectomy. Loculated small  to moderate anterior right hydropneumothorax, overall stable in size, with significantly decreased internal fluid, appearing contiguous with one of the right middle lobe airways (series 4/image 55), suggesting chronic bronchopleural fistula. Small loculated dependent basilar right pleural effusion with associated smooth right pleural thickening, unchanged. No left pleural effusion. No left pneumothorax. Persistent patchy centrilobular micronodularity throughout both lungs, slightly worsened. New mild patchy ground-glass opacities in the left upper and left lower lobes. New mild patchy foci of consolidation in the inferior lingula and medial basilar left lower lobe. No lung masses or significant discrete solid pulmonary nodules.  Upper abdomen: No acute abnormality.  Musculoskeletal: No aggressive appearing focal osseous lesions. Moderate thoracic spondylosis.  IMPRESSION: 1. Stable postsurgical changes in the right hemithorax. 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 2. Persistent patchy centrilobular micronodularity throughout both lungs, slightly worsened. New mild patchy ground-glass opacity and mild patchy consolidation in the left lung. Findings favor a nonspecific infectious or inflammatory bronchiolitis/bronchopneumonia, with the differential including recurrent aspiration. If the patient is a current smoker, respiratory bronchiolitis-interstitial lung disease (RB-ILD) is a consideration. 3. Stable mild mediastinal lymphadenopathy, nonspecific. No findings highly suspicious for metastatic disease in the chest. 4. Three-vessel coronary atherosclerosis. 5. Aortic Atherosclerosis (ICD10-I70.0).   Electronically Signed   By: Ilona Sorrel M.D.   On: 02/12/2020 13:55 I personally reviewed the CT images and concur with the findings noted above.  The space in the right  chest is unchanged in size although there has been a decrease in the fluid within it.  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  He had a right upper lobectomy for a stage Ia non-small cell carcinoma back in 2019.  He is now 3 years out from surgery.  He has no evidence of recurrent disease.  He does have a chronic space in the right apex.  There are no findings on his CT today to suggest recurrent disease.  He asked if there was anything that could help his shortness of breath.  I am going to refer him back to Dr. Lamonte Sakai to get his opinion on whether any inhalers might be of use.  Plan: Follow-up with Dr. Lamonte Sakai Return in 1 year with CT chest  I spent over 20 minutes in review of the images, records, and in consultation with Mr. Livesay today [name], MD [facility] [phone]

## 2020-03-07 ENCOUNTER — Encounter: Payer: Self-pay | Admitting: Emergency Medicine

## 2020-03-07 ENCOUNTER — Ambulatory Visit: Payer: Medicare HMO | Admitting: Emergency Medicine

## 2020-03-07 ENCOUNTER — Other Ambulatory Visit: Payer: Self-pay

## 2020-03-07 VITALS — BP 120/64 | HR 78 | Temp 97.2°F | Ht 74.0 in | Wt 134.9 lb

## 2020-03-07 DIAGNOSIS — R001 Bradycardia, unspecified: Secondary | ICD-10-CM

## 2020-03-07 DIAGNOSIS — J449 Chronic obstructive pulmonary disease, unspecified: Secondary | ICD-10-CM

## 2020-03-07 DIAGNOSIS — R42 Dizziness and giddiness: Secondary | ICD-10-CM

## 2020-03-07 DIAGNOSIS — R053 Chronic cough: Secondary | ICD-10-CM | POA: Diagnosis not present

## 2020-03-07 MED ORDER — STIOLTO RESPIMAT 2.5-2.5 MCG/ACT IN AERS: 2.0000 | INHALATION_SPRAY | Freq: Every day | RESPIRATORY_TRACT | 0 refills | Status: DC

## 2020-03-07 NOTE — Assessment & Plan Note (Signed)
May be related to the COPD but he does have poorly controlled GERD, has never used medication for this.  We will try omeprazole in the evening, 1 hour around dinner since the cough is mainly at night to see if he gets benefit.

## 2020-03-07 NOTE — Patient Instructions (Addendum)
Please start Stiolto 2 puffs once daily.  Take this every day on a schedule.  If your breathing benefits then we will write a prescription for you to fill your pharmacy. Walking oximetry today on room air. Try starting omeprazole 20 mg once daily 1 hour around food.  This is to help with stomach reflux which can often cause cough. We will consider repeating your pulmonary function testing at some point in the future. Follow Dr. Lamonte Sakai in 1 month or next available so that we can review your improvement on the inhaled medication.

## 2020-03-07 NOTE — Progress Notes (Addendum)
Subjective:    Patient ID: Jerry Wise, male    DOB: 05/08/1940, 80 y.o.   MRN: [id number]  HPI 80 year old man, former smoker (30 pack years) with a history of childhood asthma, lung cancer status post right upper lobe lobectomy 03/2017, hypertension, prostate cancer post radioactive seed implants, pernicious anemia, chronic renal insufficiency.  He has been dealing with exertional dyspnea, chronic cough with white mucus. This has worsened slowly over several months to a year. No CP. Not much cough except when he lays down to bed. He does have reflux.   Pulmonary function testing 01/20/2017 reviewed by me, showed moderately severe obstruction without a bronchodilator response, normal lung volumes and normal diffusion capacity   Review of Systems As per HPI  Past Medical History:  Diagnosis Date  . Asthma    as child  . Cancer ([facility])    lung cancer  . Chronic renal insufficiency    NEPHROLOGIST-  DR Justin Mend -- LAST VISIT MARCH 2014  . Dyspnea   . GERD (gastroesophageal reflux disease)   . Gout   . Gouty arthritis    ALL JOINTS  . History of kidney stones   . History of prostate cancer    S/P RADIACTIVE SEED IMPLANTS  . Hydronephrosis, bilateral   . Hypertension   . 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 Pernicious anemia FOLLOWED BY DR WEBB   x3 feraheme injection --- last one 03-22-2012  . Urethral stricture      No family history on file.   Social History   Socioeconomic History  . Marital status: Married    Spouse name: Not on file  . Number of children: Not on file  . Years of education: Not on file  . Highest education level: Not on file  Occupational History  . Not on file  Tobacco Use  . Smoking status: Former Smoker    Packs/day: 0.50    Years: 55.00    Pack years: 27.50    Types: Cigarettes    Start date: 51    Quit date: 01/2017    Years since quitting: 3.1  .  Smokeless tobacco: Never Used  Vaping Use  . Vaping Use: Never used  Substance and Sexual Activity  . Alcohol use: Yes    Alcohol/week: 14.0 standard drinks    Types: 14 Standard drinks or equivalent per week    Comment: 3 or more SHOT DAILY  . Drug use: No  . Sexual activity: Not on file  Other Topics Concern  . Not on file  Social History Narrative  . Not on file   Social Determinants of Health   Financial Resource Strain: Not on file  Food Insecurity: Not on file  Transportation Needs: Not on file  Physical Activity: Not on file  Stress: Not on file  Social Connections: Not on file  Intimate Partner Violence: Not on file    No known TB or asbestos exposure. 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 Shellfish-Derived Products Other (See Comments)     Outpatient Medications Prior to Visit  Medication Sig Dispense Refill  . acetaminophen (TYLENOL) 500 MG tablet Take 1,000 mg by mouth every 8 (eight) hours as needed for mild pain or headache.    . allopurinol (ZYLOPRIM) 300 MG tablet Take 300 mg by mouth  daily.     . amLODipine (NORVASC) 10 MG tablet Take 10 mg by mouth daily.     Marland Kitchen aspirin EC 81 MG tablet Take 81 mg by mouth daily.    Marland Kitchen atenolol (TENORMIN) 100 MG tablet Take 100 mg by mouth daily.     . Cholecalciferol 2000 units CAPS Take 2,000 Units by mouth daily.    . hydrochlorothiazide (MICROZIDE) 12.5 MG capsule Take 12.5 mg by mouth daily.    Marland Kitchen PARoxetine (PAXIL) 10 MG tablet     . megestrol (MEGACE) 40 MG tablet Take 2 tablets (80 mg total) by mouth daily. 30 tablet 3   No facility-administered medications prior to visit.        Objective:   Physical Exam Vitals:   03/07/20 1449  BP: 120/64  Pulse: 78  Temp: (!) 97.2 F (36.2 C)  TempSrc: Temporal  SpO2: 100%  Weight: 134 lb 14.4 oz (61.2 kg)  Height: 6\' 2"  (1.88 m)   Gen: Pleasant, very thin, in no distress,   normal affect  ENT: No lesions,  mouth clear,  oropharynx clear, no postnasal drip  Neck: No JVD, no stridor  Lungs: No use of accessory muscles, no crackles or wheezing on normal respiration, no wheeze on forced expiration  Cardiovascular: RRR, heart sounds normal, no murmur or gallops, no peripheral edema  Musculoskeletal: No deformities, no cyanosis or clubbing  Neuro: alert, awake, non focal  Skin: Warm, no lesions or rash     Assessment & Plan:  COPD (chronic obstructive pulmonary disease) (HCC) His pulmonary function testing from 2019 shows obstruction consistent with COPD.  He is not on any therapy.  Suspect he would benefit from maintenance bronchodilator.  I will try Stiolto to see if he tolerates and improves.  We could consider repeating his pulmonary function testing going forward.  I think he also needs a walking oximetry to ensure no occult desaturation.  ADDENDUM: Patient was able to ambulate without desaturation but then had to stop after short distance due to dyspnea.  He then needed to use the restroom and immediately afterwards was dizzy, found to have heart rate in the 40s.  This resolved to the 70s when he was placed in a wheelchair and rested.  No EKG or rhythm strip done.  Question whether this may be a recurrent problem, bradycardia may be contributing to his overall functional decline and dyspnea.  Certainly may be related to his episodic dizziness.  He has never had overt syncope.  I think he needs to be referred for cardiology evaluation and probably an event monitor so that we can figure out if this is happening frequently, is associated with symptoms.  Chronic cough May be related to the COPD but he does have poorly controlled GERD, has never used medication for this.  We will try omeprazole in the evening, 1 hour around dinner since the cough is mainly at night to see if he gets benefit.  [name], MD, PhD 03/07/2020, 3:57 PM [facility] [phone] or if no answer before 7:00PM call [phone] For any issues after 7:00PM please call eLink [phone]

## 2020-03-07 NOTE — Assessment & Plan Note (Addendum)
His pulmonary function testing from 2019 shows obstruction consistent with COPD.  He is not on any therapy.  Suspect he would benefit from maintenance bronchodilator.  I will try Stiolto to see if he tolerates and improves.  We could consider repeating his pulmonary function testing going forward.  I think he also needs a walking oximetry to ensure no occult desaturation.  ADDENDUM: Patient was able to ambulate without desaturation but then had to stop after short distance due to dyspnea.  He then needed to use the restroom and immediately afterwards was dizzy, found to have heart rate in the 40s.  This resolved to the 70s when he was placed in a wheelchair and rested.  No EKG or rhythm strip done.  Question whether this may be a recurrent problem, bradycardia may be contributing to his overall functional decline and dyspnea.  Certainly may be related to his episodic dizziness.  He has never had overt syncope.  I think he needs to be referred for cardiology evaluation and probably an event monitor so that we can figure out if this is happening frequently, is associated with symptoms.

## 2020-03-07 NOTE — Addendum Note (Signed)
Addended by: Lorretta Harp on: 03/07/2020 04:09 PM   Modules accepted: Orders

## 2020-03-11 DIAGNOSIS — N320 Bladder-neck obstruction: Secondary | ICD-10-CM | POA: Diagnosis not present

## 2020-03-27 ENCOUNTER — Encounter: Payer: Self-pay | Admitting: *Deleted

## 2020-03-27 ENCOUNTER — Ambulatory Visit (INDEPENDENT_AMBULATORY_CARE_PROVIDER_SITE_OTHER): Payer: Medicare HMO

## 2020-03-27 ENCOUNTER — Other Ambulatory Visit: Payer: Self-pay

## 2020-03-27 ENCOUNTER — Encounter: Payer: Self-pay | Admitting: Internal Medicine

## 2020-03-27 ENCOUNTER — Ambulatory Visit: Payer: Medicare HMO | Admitting: Internal Medicine

## 2020-03-27 VITALS — BP 114/60 | HR 73 | Ht 74.0 in | Wt 139.8 lb

## 2020-03-27 DIAGNOSIS — I493 Ventricular premature depolarization: Secondary | ICD-10-CM | POA: Diagnosis not present

## 2020-03-27 DIAGNOSIS — I700 Atherosclerosis of aorta: Secondary | ICD-10-CM | POA: Diagnosis not present

## 2020-03-27 DIAGNOSIS — I10 Essential (primary) hypertension: Secondary | ICD-10-CM | POA: Diagnosis not present

## 2020-03-27 LAB — LIPID PANEL
Chol/HDL Ratio: 2.3 ratio (ref 0.0–5.0)
Cholesterol, Total: 142 mg/dL (ref 100–199)
HDL: 63 mg/dL (ref >39)
LDL Chol Calc (NIH): 64 mg/dL (ref 0–99)
Triglycerides: 79 mg/dL (ref 0–149)
VLDL Cholesterol Cal: 15 mg/dL (ref 5–40)

## 2020-03-27 MED ORDER — LABETALOL HCL 100 MG PO TABS: 100.0000 mg | ORAL_TABLET | Freq: Two times a day (BID) | ORAL | 3 refills | Status: AC

## 2020-03-27 NOTE — Progress Notes (Signed)
Patient ID: Jerry Wise, male   DOB: October 29, 1940, 80 y.o.   MRN: [id number] Patient enrolled for [employer] to ship a 14 day ZIO XT long term holter monitor to his home.

## 2020-03-27 NOTE — Patient Instructions (Signed)
Medication Instructions:  Your physician has recommended you make the following change in your medication:  STOP: atenolol   START: labetalol 100 mg by mouth twice daily *If you need a refill on your cardiac medications before your next appointment, please call your pharmacy*   Lab Work: TODAY: lipid panel If you have labs (blood work) drawn today and your tests are completely normal, you will receive your results only by: Marland Kitchen MyChart Message (if you have MyChart) OR . A paper copy in the mail If you have any lab test that is abnormal or we need to change your treatment, we will call you to review the results.   Testing/Procedures: Your physician has requested that you have an echocardiogram. Echocardiography is a painless test that uses sound waves to create images of your heart. It provides your doctor with information about the size and shape of your heart and how well your heart's chambers and valves are working. This procedure takes approximately one hour. There are no restrictions for this procedure.  Your physician has requested that you wear a 14 day heart monitor.     Follow-Up: At [facility], you and your health needs are our priority.  As part of our continuing mission to provide you with exceptional heart care, we have created designated Provider Care Teams.  These Care Teams include your primary Cardiologist (physician) and Advanced Practice Providers (APPs -  Physician Assistants and Nurse Practitioners) who all work together to provide you with the care you need, when you need it.  We recommend signing up for the patient portal called "MyChart".  Sign up information is provided on this After Visit Summary.  MyChart is used to connect with patients for Virtual Visits (Telemedicine).  Patients are able to view lab/test results, encounter notes, upcoming appointments, etc.  Non-urgent messages can be sent to your provider as well.   To learn more about what you can do with  MyChart, go to [email or web address].    Your next appointment:   6-7 month(s)  The format for your next appointment:   In Person  Provider:   You may see Werner Lean, MD or one of the following Advanced Practice Providers on your designated Care Team:    Melina Copa, PA-C  Ermalinda Barrios, PA-C    Other Instructions Bryn Gulling- Long Term Monitor Instructions   Your physician has requested you wear your ZIO patch monitor___14____days.   This is a single patch monitor.  [employer] supplies one patch monitor per enrollment.  Additional stickers are not available.   Please do not apply patch if you will be having a Nuclear Stress Test, Echocardiogram, Cardiac CT, MRI, or Chest Xray during the time frame you would be wearing the monitor. The patch cannot be worn during these tests.  You cannot remove and re-apply the ZIO XT patch monitor.   Your ZIO patch monitor will be sent USPS Priority mail from [facility] directly to your home address. The monitor may also be mailed to a PO BOX if home delivery is not available.   It may take 3-5 days to receive your monitor after you have been enrolled.   Once you have received you monitor, please review enclosed instructions.  Your monitor has already been registered assigning a specific monitor serial # to you.   Applying the monitor   Shave hair from upper left chest.   Hold abrader disc by orange tab.  Rub abrader in 40 strokes over left upper chest as  indicated in your monitor instructions.   Clean area with 4 enclosed alcohol pads .  Use all pads to assure are is cleaned thoroughly.  Let dry.   Apply patch as indicated in monitor instructions.  Patch will be place under collarbone on left side of chest with arrow pointing upward.   Rub patch adhesive wings for 2 minutes.Remove white label marked "1".  Remove white label marked "2".  Rub patch adhesive wings for 2 additional minutes.   While looking in a mirror, press and  release button in center of patch.  A small green light will flash 3-4 times .  This will be your only indicator the monitor has been turned on.     Do not shower for the first 24 hours.  You may shower after the first 24 hours.   Press button if you feel a symptom. You will hear a small click.  Record Date, Time and Symptom in the Patient Log Book.   When you are ready to remove patch, follow instructions on last 2 pages of Patient Log Book.  Stick patch monitor onto last page of Patient Log Book.   Place Patient Log Book in [place] box.  Use locking tab on box and tape box closed securely.  The [employer] and [employer] has [employer] on it.  Please place in mailbox as soon as possible.  Your physician should have your test results approximately 7 days after the monitor has been mailed back to [facility].   Call [place] at [phone] if you have questions regarding your ZIO XT patch monitor.  Call them immediately if you see an orange light blinking on your monitor.   If your monitor falls off in less than 4 days contact our Monitor department at [phone].  If your monitor becomes loose or falls off after 4 days call [employer] at [phone] for suggestions on securing your monitor.

## 2020-03-27 NOTE — Progress Notes (Signed)
Cardiology Office Note:    Date:  03/27/2020   ID:  Jerry Wise, [place] 02/18/1940, MRN [id number]  "[name]"  PCP:  Josetta Huddle, [place]  Cardiologist:  Werner Lean, MD  Advanced Practice Provider:  No care team member to display Electrophysiologist:  None       CC: Low heart rates Consulted for the evaluation of  at the behest of Josetta Huddle, MD  History of Present Illness:    Jerry Wise is a 80 y.o. male with a hx of Prior Lung Cancer with prior resection but without chemo radiation, COPD, CKD IIIa, and PVCs who presents for evaluation.  Patient notes that he is feeling better.  Was walking in the the hall at [facility] visit and felt dizzy.  Heart rate by pulse oximeter was 35 but this resolved when seeing Dr. Lamonte Sakai.  Has had no chest pain, chest pressure, chest tightness, chest stinging.  Notes that he has heart burn on occasion.  Discomfort occurred spontaneously, and improves with time.  Patient exertion notable for walking here  and feels no symptoms.  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  No syncope, but felt dizzy the day after seeing Dr. Lamonte Sakai. Notes no palpitations or funny heart beats.     Patient reports NO prior cardiac testing including  echo,  stress test,  heart catheterizations,  cardioversion,  ablations.   Past Medical History:  Diagnosis Date  . Asthma    as child  . Cancer ([place])    lung cancer  . Chronic renal insufficiency    NEPHROLOGIST-  DR Justin Mend -- LAST VISIT MARCH 2014  . Dyspnea   . GERD (gastroesophageal reflux disease)   . Gout   . Gouty arthritis    ALL JOINTS  . History of kidney stones   . History of prostate cancer    S/P RADIACTIVE SEED IMPLANTS  . Hydronephrosis, bilateral   . Hypertension   . 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 Pernicious anemia FOLLOWED BY DR WEBB    x3 feraheme injection --- last one 03-22-2012  . Urethral stricture     Past Surgical History:  Procedure Laterality Date  . CLOSED REDUCTION CLAVICAL FX     NO HARDWARE  . COLONOSCOPY    . CYSTO/  BALLOON DILATION OF URETHRAL STRICTURE AND BLADDER BX WITH FULGERATION  09-14-2007  . CYSTOSCOPY W/ URETERAL STENT PLACEMENT N/A 04/06/2012   Procedure: CYSTOSCOPY Balloon DILATION OF STRICTURE, Fulgeration Bladder Neck, Cystogram;  Surgeon: Malka So, MD;  Location: [facility];  Service: Urology;  Laterality: N/A;  . DIRECT LARYNGOSCOPY N/A 03/03/2017   Procedure: DIRECT LARYNGOSCOPY WITH nasopharyngoscopy and BIOPSY;  Surgeon: Rozetta Nunnery, MD;  Location: [place];  Service: ENT;  Laterality: N/A;  . IR THORACENTESIS ASP PLEURAL SPACE W/IMG GUIDE  06/19/2018  . KNEE SURGERY    . MEDIASTINOSCOPY N/A 03/03/2017   Procedure: MEDIASTINOSCOPY;  Surgeon: [name], MD;  Location: [facility] OR;  Service: Thoracic;  Laterality: N/A;  . MULTIPLE TOOTH EXTRACTIONS    . PARATHYROIDECTOMY Right 04/28/2012   Procedure:  RIGHT INFERIOR PARATHYROIDECTOMY;  Surgeon: Earnstine Regal, MD;  Location: [facility];  Service: General;  Laterality: Right;  . RADIOACTIVE PROSTATE SEED IMPLANTS  2000   prostate cancer  . VIDEO ASSISTED THORACOSCOPY (  VATS)/ LOBECTOMY Right 03/03/2017   Procedure: VIDEO ASSISTED THORACOSCOPY (VATS)/RIGHT UPPER LOBECTOMY;  Surgeon: [name], MD;  Location: [place];  Service: Thoracic;  Laterality: Right;  Marland Kitchen VIDEO ASSISTED THORACOSCOPY (VATS)/WEDGE RESECTION Right 03/21/2017   Procedure: REDO VIDEO ASSISTED THORACOSCOPY (VATS)/WEDGE RESECTION;  Surgeon: [name], MD;  Location: [place];  Service: Thoracic;  Laterality: Right;  Marland Kitchen VIDEO BRONCHOSCOPY WITH ENDOBRONCHIAL NAVIGATION N/A 02/02/2017   Procedure: VIDEO BRONCHOSCOPY WITH ENDOBRONCHIAL NAVIGATION  with biopsies  and Fiducial Placement;  Surgeon: Collene Gobble, MD;  Location: [place];  Service:  Thoracic;  Laterality: N/A;  . VIDEO BRONCHOSCOPY WITH ENDOBRONCHIAL ULTRASOUND N/A 02/02/2017   Procedure: VIDEO BRONCHOSCOPY WITH ENDOBRONCHIAL ULTRASOUND  with node biopsies;  Surgeon: Collene Gobble, MD;  Location: [facility] OR;  Service: Thoracic;  Laterality: N/A;    Current Medications: Current Meds  Medication Sig  . acetaminophen (TYLENOL) 500 MG tablet Take 1,000 mg by mouth every 8 (eight) hours as needed for mild pain or headache.  . allopurinol (ZYLOPRIM) 300 MG tablet Take 300 mg by mouth daily.   Marland Kitchen amLODipine (NORVASC) 10 MG tablet Take 10 mg by mouth daily.   Marland Kitchen aspirin EC 81 MG tablet Take 81 mg by mouth daily.  . Cholecalciferol 2000 units CAPS Take 2,000 Units by mouth daily.  . hydrochlorothiazide (MICROZIDE) 12.5 MG capsule Take 12.5 mg by mouth daily.  Marland Kitchen labetalol (NORMODYNE) 100 MG tablet Take 1 tablet (100 mg total) by mouth 2 (two) times daily.  Marland Kitchen PARoxetine (PAXIL) 10 MG tablet   . Tiotropium Bromide-Olodaterol (STIOLTO RESPIMAT) 2.5-2.5 MCG/ACT AERS Inhale 2 puffs into the lungs daily.  . [DISCONTINUED] atenolol (TENORMIN) 100 MG tablet Take 100 mg by mouth daily.      Allergies:   Shellfish-derived products   Social History   Socioeconomic History  . Marital status: Married    Spouse name: Not on file  . Number of children: Not on file  . Years of education: Not on file  . Highest education level: Not on file  Occupational History  . Not on file  Tobacco Use  . Smoking status: Former Smoker    Packs/day: 0.50    Years: 55.00    Pack years: 27.50    Types: Cigarettes    Start date: 59    Quit date: 01/2017    Years since quitting: 3.2  . Smokeless tobacco: Never Used  Vaping Use  . Vaping Use: Never used  Substance and Sexual Activity  . Alcohol use: Yes    Alcohol/week: 14.0 standard drinks    Types: 14 Standard drinks or equivalent per week    Comment: 3 or more SHOT DAILY  . Drug use: No  . Sexual activity: Not on file  Other Topics Concern   . Not on file  Social History Narrative  . Not on file   Social Determinants of Health   Financial Resource Strain: Not on file  Food Insecurity: Not on file  Transportation Needs: Not on file  Physical Activity: Not on file  Stress: Not on file  Social Connections: Not on file     Family History: History of coronary artery disease notable for father. History of heart failure notable for no members. History of arrhythmia notable for no members.  ROS:   Please see the history of present illness.     All other systems reviewed and are negative.  EKGs/Labs/Other Studies Reviewed:    The following studies were reviewed today:  EKG:   Sr rate 73 with frequent PVCs  NonCardiac CT : Date: 02/12/20 Results: 1. Stable postsurgical changes in the right hemithorax. 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 2. Persistent patchy centrilobular micronodularity throughout both lungs, slightly worsened. New mild patchy ground-glass opacity and mild patchy consolidation in the left lung. Findings favor a nonspecific infectious or inflammatory bronchiolitis/bronchopneumonia, with the differential including recurrent aspiration. If the patient is a current smoker, respiratory bronchiolitis-interstitial lung disease (RB-ILD) is a consideration. 3. Stable mild mediastinal lymphadenopathy, nonspecific. No findings highly suspicious for metastatic disease in the chest. 4. Three-vessel coronary atherosclerosis. 5. Aortic Atherosclerosis (ICD10-I70.0).   Recent Labs: No results found for requested labs within last 8760 hours.  Recent Lipid Panel No results found for: CHOL, TRIG, HDL, CHOLHDL, VLDL, LDLCALC, LDLDIRECT   Risk Assessment/Calculations:     N/A  Physical Exam:    VS:  BP 114/60   Pulse 73   Ht 6\' 2"  (1.88 m)   Wt 139 lb 12.8 oz (63.4 kg)   SpO2 98%   BMI 17.95  kg/m     Wt Readings from Last 3 Encounters:  03/27/20 139 lb 12.8 oz (63.4 kg)  03/07/20 134 lb 14.4 oz (61.2 kg)  02/19/20 135 lb (61.2 kg)   GEN: Elderly male well developed in no acute distress HEENT: Normal NECK: No JVD; No carotid bruits LYMPHATICS: No lymphadenopathy CARDIAC: RRR with occasional funny heart beat, no murmurs, rubs, gallops; minimal radial pulse bilaterally  RESPIRATORY:  Clear to auscultation without rales, wheezing or rhonchi  ABDOMEN: Soft, non-tender, non-distended MUSCULOSKELETAL:  No edema; No deformity  SKIN: Warm and dry NEUROLOGIC:  Alert and oriented x 3 PSYCHIATRIC:  Normal affect; does not demonstrate strong fund of knowledge over his disease processes  ASSESSMENT:    1. Frequent PVCs   2. Aortic atherosclerosis ([place])   3. Hypertension, unspecified type    PLAN:    In order of problems listed above:  Dizziness with walking Frequent PVCs CKD Stage IIIa Aortic Atherosclerosis - suspect that this was related to his PVCs on the monitor and not true bradycardia - will place 14 day non-live ziopatch - Will stop atenolol given renal clearance of this drug for Labetalol 100 mg PO BID - give this and his coronary artery calcium; offered exercise stress test (NM) but patient would like to get non-invasive testing further  Aortic atherosclerosis - will lipid panel and likely start statin; LDL goal < 70  HTN - controled on norvasc and HCTZ - BB   Summer/Fall  follow up unless new symptoms or abnormal test results warranting change in plan  Would be reasonable for  Video Visit Follow up Would be reasonable for  APP Follow up   Medication Adjustments/Labs and Tests Ordered: Current medicines are reviewed at length with the patient today.  Concerns regarding medicines are outlined above.  Orders Placed This Encounter  Procedures  . Lipid panel  . LONG TERM MONITOR (3-14 DAYS)  . EKG 12-Lead  . ECHOCARDIOGRAM COMPLETE   Meds ordered this  encounter  Medications  . labetalol (NORMODYNE) 100 MG tablet    Sig: Take 1 tablet (100 mg total) by mouth 2 (two) times daily.    Dispense:  180 tablet    Refill:  3    Patient Instructions  Medication Instructions:  Your physician has recommended you make the following change in your medication:  STOP: atenolol   START:  labetalol 100 mg by mouth twice daily *If you need a refill on your cardiac medications before your next appointment, please call your pharmacy*   Lab Work: TODAY: lipid panel If you have labs (blood work) drawn today and your tests are completely normal, you will receive your results only by: Marland Kitchen MyChart Message (if you have MyChart) OR . A paper copy in the mail If you have any lab test that is abnormal or we need to change your treatment, we will call you to review the results.   Testing/Procedures: Your physician has requested that you have an echocardiogram. Echocardiography is a painless test that uses sound waves to create images of your heart. It provides your doctor with information about the size and shape of your heart and how well your heart's chambers and valves are working. This procedure takes approximately one hour. There are no restrictions for this procedure.  Your physician has requested that you wear a 14 day heart monitor.     Follow-Up: At [facility], you and your health needs are our priority.  As part of our continuing mission to provide you with exceptional heart care, we have created designated Provider Care Teams.  These Care Teams include your primary Cardiologist (physician) and Advanced Practice Providers (APPs -  Physician Assistants and Nurse Practitioners) who all work together to provide you with the care you need, when you need it.  We recommend signing up for the patient portal called "MyChart".  Sign up information is provided on this After Visit Summary.  MyChart is used to connect with patients for Virtual Visits  (Telemedicine).  Patients are able to view lab/test results, encounter notes, upcoming appointments, etc.  Non-urgent messages can be sent to your provider as well.   To learn more about what you can do with MyChart, go to [email or web address].    Your next appointment:   6-7 month(s)  The format for your next appointment:   In Person  Provider:   You may see Werner Lean, MD or one of the following Advanced Practice Providers on your designated Care Team:    Melina Copa, PA-C  Ermalinda Barrios, PA-C    Other Instructions Bryn Gulling- Long Term Monitor Instructions   Your physician has requested you wear your ZIO patch monitor___14____days.   This is a single patch monitor.  [employer] supplies one patch monitor per enrollment.  Additional stickers are not available.   Please do not apply patch if you will be having a Nuclear Stress Test, Echocardiogram, Cardiac CT, MRI, or Chest Xray during the time frame you would be wearing the monitor. The patch cannot be worn during these tests.  You cannot remove and re-apply the ZIO XT patch monitor.   Your ZIO patch monitor will be sent USPS Priority mail from [facility] directly to your home address. The monitor may also be mailed to a PO BOX if home delivery is not available.   It may take 3-5 days to receive your monitor after you have been enrolled.   Once you have received you monitor, please review enclosed instructions.  Your monitor has already been registered assigning a specific monitor serial # to you.   Applying the monitor   Shave hair from upper left chest.   Hold abrader disc by orange tab.  Rub abrader in 40 strokes over left upper chest as indicated in your monitor instructions.   Clean area with 4 enclosed alcohol pads .  Use all pads to assure  are is cleaned thoroughly.  Let dry.   Apply patch as indicated in monitor instructions.  Patch will be place under collarbone on left side of chest with arrow  pointing upward.   Rub patch adhesive wings for 2 minutes.Remove white label marked "1".  Remove white label marked "2".  Rub patch adhesive wings for 2 additional minutes.   While looking in a mirror, press and release button in center of patch.  A small green light will flash 3-4 times .  This will be your only indicator the monitor has been turned on.     Do not shower for the first 24 hours.  You may shower after the first 24 hours.   Press button if you feel a symptom. You will hear a small click.  Record Date, Time and Symptom in the Patient Log Book.   When you are ready to remove patch, follow instructions on last 2 pages of Patient Log Book.  Stick patch monitor onto last page of Patient Log Book.   Place Patient Log Book in [place] box.  Use locking tab on box and tape box closed securely.  The [employer] has [employer] on it.  Please place in mailbox as soon as possible.  Your physician should have your test results approximately 7 days after the monitor has been mailed back to [facility].   Call [place] at [phone] if you have questions regarding your ZIO XT patch monitor.  Call them immediately if you see an orange light blinking on your monitor.   If your monitor falls off in less than 4 days contact our Monitor department at [phone].  If your monitor becomes loose or falls off after 4 days call [employer] at [phone] for suggestions on securing your monitor.       Signed, Werner Lean, MD  03/27/2020 12:05 PM    [place]

## 2020-04-01 DIAGNOSIS — I493 Ventricular premature depolarization: Secondary | ICD-10-CM | POA: Diagnosis not present

## 2020-04-08 ENCOUNTER — Ambulatory Visit: Payer: Medicare HMO | Admitting: Emergency Medicine

## 2020-04-08 ENCOUNTER — Encounter: Payer: Self-pay | Admitting: Emergency Medicine

## 2020-04-08 ENCOUNTER — Other Ambulatory Visit: Payer: Self-pay

## 2020-04-08 DIAGNOSIS — C3411 Malignant neoplasm of upper lobe, right bronchus or lung: Secondary | ICD-10-CM

## 2020-04-08 DIAGNOSIS — I493 Ventricular premature depolarization: Secondary | ICD-10-CM | POA: Diagnosis not present

## 2020-04-08 DIAGNOSIS — R338 Other retention of urine: Secondary | ICD-10-CM | POA: Diagnosis not present

## 2020-04-08 DIAGNOSIS — J449 Chronic obstructive pulmonary disease, unspecified: Secondary | ICD-10-CM

## 2020-04-08 MED ORDER — ALBUTEROL SULFATE HFA 108 (90 BASE) MCG/ACT IN AERS: 2.0000 | INHALATION_SPRAY | Freq: Four times a day (QID) | RESPIRATORY_TRACT | 6 refills | Status: AC | PRN

## 2020-04-08 MED ORDER — STIOLTO RESPIMAT 2.5-2.5 MCG/ACT IN AERS: 2.0000 | INHALATION_SPRAY | Freq: Every day | RESPIRATORY_TRACT | 5 refills | Status: AC

## 2020-04-08 NOTE — Patient Instructions (Addendum)
Please continue Stiolto 2 puffs once daily.  We will send a prescription for this to your pharmacy. We will also give you a prescription for albuterol.  You can use this inhaler 2 puffs if needed for immediate symptoms of shortness of breath, coughing, wheezing. Follow Dr. Roxan Hockey as scheduled, get your repeat CT scan as planned Follow Dr. Lamonte Sakai in 6 months or sooner if your breathing changes.

## 2020-04-08 NOTE — Assessment & Plan Note (Signed)
He benefited from the [employer].  Suspect he does have fairly significant COPD.  We will continue.  Also start albuterol for him to use as needed.  He has had his COVID-19 vaccine, needs a booster and we discussed this today.

## 2020-04-08 NOTE — Assessment & Plan Note (Signed)
Following with Dr. Roxan Hockey.  He will have annual CT chest for surveillance.

## 2020-04-08 NOTE — Progress Notes (Signed)
Subjective:    Patient ID: Jerry Wise, male    DOB: 28-Feb-1940, 80 y.o.   MRN: [id number]  HPI 80 year old man, former smoker (30 pack years) with a history of childhood asthma, lung cancer status post right upper lobe lobectomy 03/2017, hypertension, prostate cancer post radioactive seed implants, pernicious anemia, chronic renal insufficiency.  He has been dealing with exertional dyspnea, chronic cough with white mucus. This has worsened slowly over several months to a year. No CP. Not much cough except when he lays down to bed. He does have reflux.   Pulmonary function testing 01/20/2017 reviewed by me, showed moderately severe obstruction without a bronchodilator response, normal lung volumes and normal diffusion capacity  ROV 04/08/20 --Jerry Wise is 80 with a history of tobacco use, childhood asthma, right upper lobe lobectomy 03/2017 for non-small cell lung cancer.  Also with hypertension, prostate cancer and chronic renal insufficiency.  He has chronic cough and I started him on omeprazole for uncontrolled GERD last visit.  We are treating him for COPD, started Stiolto 1 month ago.  He did not desaturate at our last visit but he did have an episode of dizziness and bradycardia to 40s.  He was seen by cardiology 3/24, suspected to have been due to frequent PVCs as opposed to true bradycardia.  His atenolol was changed to labetalol and 14 days of monitoring was recommended - thru 4/11. He feels that he has benefited from the [place], less wheeze, less cough. He is still limited w exertion - significant fatigue even w minimal exertion.   Review of Systems As per HPI  Past Medical History:  Diagnosis Date  . Asthma    as child  . Cancer ([place])    lung cancer  . Chronic renal insufficiency    NEPHROLOGIST-  Jerry Wise -- LAST VISIT MARCH 2014  . Dyspnea   . GERD (gastroesophageal reflux disease)   . Gout   . Gouty arthritis    ALL JOINTS  . History of kidney stones   . History of prostate  cancer    S/P RADIACTIVE SEED IMPLANTS  . Hydronephrosis, bilateral   . Hypertension   . 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 Pernicious anemia FOLLOWED BY Jerry Wise   x3 feraheme injection --- last one 03-22-2012  . Urethral stricture      No family history on file.   Social History   Socioeconomic History  . Marital status: Married    Spouse name: Not on file  . Number of children: Not on file  . Years of education: Not on file  . Highest education level: Not on file  Occupational History  . Not on file  Tobacco Use  . Smoking status: Former Smoker    Packs/day: 0.50    Years: 55.00    Pack years: 27.50    Types: Cigarettes    Start date: 51    Quit date: 01/2017    Years since quitting: 3.2  . Smokeless tobacco: Never Used  Vaping Use  . Vaping Use: Never used  Substance and Sexual Activity  . Alcohol use: Yes    Alcohol/week: 14.0 standard drinks    Types: 14 Standard drinks or equivalent per week    Comment: 3 or more SHOT DAILY  . Drug use: No  . Sexual activity: Not on file  Other Topics Concern  . Not on file  Social History  Narrative  . Not on file   Social Determinants of Health   Financial Resource Strain: Not on file  Food Insecurity: Not on file  Transportation Needs: Not on file  Physical Activity: Not on file  Stress: Not on file  Social Connections: Not on file  Intimate Partner Violence: Not on file    No known TB or asbestos exposure. 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 Shellfish-Derived Products Other (See Comments)     Outpatient Medications Prior to Visit  Medication Sig Dispense Refill  . acetaminophen (TYLENOL) 500 MG tablet Take 1,000 mg by mouth every 8 (eight) hours as needed for mild pain or headache.    . allopurinol (ZYLOPRIM) 300 MG tablet Take 300  mg by mouth daily.     Marland Kitchen amLODipine (NORVASC) 10 MG tablet Take 10 mg by mouth daily.     Marland Kitchen aspirin EC 81 MG tablet Take 81 mg by mouth daily.    . Cholecalciferol 2000 units CAPS Take 2,000 Units by mouth daily.    . hydrochlorothiazide (MICROZIDE) 12.5 MG capsule Take 12.5 mg by mouth daily.    Marland Kitchen labetalol (NORMODYNE) 100 MG tablet Take 1 tablet (100 mg total) by mouth 2 (two) times daily. 180 tablet 3  . PARoxetine (PAXIL) 10 MG tablet     . Tiotropium Bromide-Olodaterol (STIOLTO RESPIMAT) 2.5-2.5 MCG/ACT AERS Inhale 2 puffs into the lungs daily. 4 g 0   No facility-administered medications prior to visit.        Objective:   Physical Exam Vitals:   04/08/20 1459  BP: 118/68  Pulse: (!) 104  Temp: (!) 97.5 F (36.4 C)  TempSrc: Temporal  SpO2: 97%  Weight: 140 lb 6.4 oz (63.7 kg)  Height: 6\' 2"  (1.88 m)   Gen: Pleasant, very thin, in no distress,  normal affect  ENT: No lesions,  mouth clear,  oropharynx clear, no postnasal drip  Neck: No JVD, no stridor  Lungs: No use of accessory muscles, no crackles or wheezing on normal respiration, no wheeze on forced expiration  Cardiovascular: RRR, heart sounds normal, no murmur or gallops, no peripheral edema  Musculoskeletal: No deformities, no cyanosis or clubbing  Neuro: alert, awake, non focal  Skin: Warm, no lesions or rash     Assessment & Plan:  COPD (chronic obstructive pulmonary disease) ([place]) He benefited from the [employer].  Suspect he does have fairly significant COPD.  We will continue.  Also start albuterol for him to use as needed.  He has had his COVID-19 vaccine, needs a booster and we discussed this today.  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  He will have annual CT chest for surveillance.  Frequent PVCs Heart monitor in place and he will follow up with cardiology to review  [name], MD, PhD 04/08/2020, 3:23 PM [facility] [phone] or if no answer before 7:00PM call [phone] For any issues after 7:00PM please call eLink [phone]

## 2020-04-08 NOTE — Assessment & Plan Note (Signed)
Heart monitor in place and he will follow up with cardiology to review

## 2020-04-08 NOTE — Addendum Note (Signed)
Addended by: Gavin Potters R on: 04/08/2020 03:34 PM   Modules accepted: Orders

## 2020-04-17 DIAGNOSIS — I493 Ventricular premature depolarization: Secondary | ICD-10-CM | POA: Diagnosis not present

## 2020-04-21 ENCOUNTER — Telehealth: Payer: Self-pay

## 2020-04-21 DIAGNOSIS — I493 Ventricular premature depolarization: Secondary | ICD-10-CM

## 2020-04-21 NOTE — Telephone Encounter (Signed)
Notified patient wife Siddhanth Denk (OK per [facility]) of results.  I informed her of recommendation for stress test, I will go over instructions with patient and son at 04/28/20 ECHO appointment.  I informed wife to have son ask for me Elza Rafter) so that I can go over instructions.  Also, informed wife to check pt BP daily and bring readings to ECHO appointment.  She verbalized understanding.  Order placed for NM stress test.

## 2020-04-21 NOTE — Telephone Encounter (Signed)
-----   Message from Werner Lean, MD sent at 04/19/2020  2:12 PM EDT ----- Results: Frequent PVCs Plan: - recommend echocardiogram (ordered not scheduled) - recommend stress test (discussed risks and benefits at last visit, wanted to wait for studies to come back first) - need BP check:  If SBP 140/90 will increase labetalol to 200 mg PO BID  Werner Lean, MD

## 2020-04-23 NOTE — Addendum Note (Signed)
Addended by: Precious Gilding on: 04/23/2020 04:18 PM   Modules accepted: Orders

## 2020-04-23 NOTE — Telephone Encounter (Signed)
Attestation order pended for MD to sign.

## 2020-04-23 NOTE — Progress Notes (Signed)
Nuclear medicine stress test instruction letter created.

## 2020-04-24 NOTE — Addendum Note (Signed)
Addended by: Rudean Haskell A on: 04/24/2020 08:42 AM   Modules accepted: Orders

## 2020-04-28 ENCOUNTER — Other Ambulatory Visit: Payer: Self-pay

## 2020-04-28 ENCOUNTER — Telehealth (HOSPITAL_COMMUNITY): Payer: Self-pay | Admitting: *Deleted

## 2020-04-28 ENCOUNTER — Telehealth: Payer: Self-pay

## 2020-04-28 ENCOUNTER — Ambulatory Visit (HOSPITAL_COMMUNITY): Payer: Medicare HMO | Attending: Cardiology

## 2020-04-28 DIAGNOSIS — I10 Essential (primary) hypertension: Secondary | ICD-10-CM | POA: Diagnosis not present

## 2020-04-28 DIAGNOSIS — I700 Atherosclerosis of aorta: Secondary | ICD-10-CM | POA: Diagnosis not present

## 2020-04-28 DIAGNOSIS — I493 Ventricular premature depolarization: Secondary | ICD-10-CM | POA: Insufficient documentation

## 2020-04-28 LAB — ECHOCARDIOGRAM COMPLETE
Area-P 1/2: 2.01 cm2
S' Lateral: 3.1 cm

## 2020-04-28 NOTE — Telephone Encounter (Signed)
Patient given detailed instructions per Myocardial Perfusion Study Information Sheet for the test on 04/30/20 Patient notified to arrive 15 minutes early and that it is imperative to arrive on time for appointment to keep from having the test rescheduled.  If you need to cancel or reschedule your appointment, please call the office within 24 hours of your appointment. . Patient verbalized understanding. Kirstie Peri

## 2020-04-28 NOTE — Telephone Encounter (Signed)
Spoke with Lynnae January (OK per DPR).  Went over instructions for NM ST and cancellation policy.  Instructions were written down per Mariann Laster.  She will call the office if she has any further questions or concerns.

## 2020-04-28 NOTE — Telephone Encounter (Signed)
Tried to contact patient prior to leaving Echo appointment today.  However patient left prior to speaking to me to go over ST instructions.  I left message asking patient to call the office to go over instructions.  Stress Test instruction letter mailed to patient.

## 2020-04-30 ENCOUNTER — Ambulatory Visit (HOSPITAL_COMMUNITY): Payer: Medicare HMO | Attending: Internal Medicine

## 2020-04-30 ENCOUNTER — Other Ambulatory Visit: Payer: Self-pay

## 2020-04-30 DIAGNOSIS — I493 Ventricular premature depolarization: Secondary | ICD-10-CM | POA: Insufficient documentation

## 2020-04-30 LAB — MYOCARDIAL PERFUSION IMAGING
LV dias vol: 95 mL (ref 62–150)
LV sys vol: 39 mL
Peak HR: 95 {beats}/min
Rest HR: 86 {beats}/min
SDS: 1
SRS: 0
SSS: 1
TID: 0.99

## 2020-04-30 MED ORDER — TECHNETIUM TC 99M TETROFOSMIN IV KIT
31.2000 | PACK | Freq: Once | INTRAVENOUS | Status: AC | PRN
  Administered 2020-04-30: 31.2 via INTRAVENOUS
  Filled 2020-04-30: qty 32

## 2020-04-30 MED ORDER — TECHNETIUM TC 99M TETROFOSMIN IV KIT
10.7000 | PACK | Freq: Once | INTRAVENOUS | Status: AC | PRN
  Administered 2020-04-30: 10.7 via INTRAVENOUS
  Filled 2020-04-30: qty 11

## 2020-04-30 MED ORDER — TECHNETIUM TC 99M TETROFOSMIN IV KIT
10.7000 | PACK | Freq: Once | INTRAVENOUS | Status: AC | PRN
  Filled 2020-04-30: qty 11

## 2020-04-30 MED ORDER — REGADENOSON 0.4 MG/5ML IV SOLN: 0.4000 mg | Freq: Once | INTRAVENOUS | Status: AC

## 2020-04-30 MED ORDER — REGADENOSON 0.4 MG/5ML IV SOLN
0.4000 mg | Freq: Once | INTRAVENOUS | Status: AC
  Administered 2020-04-30: 0.4 mg via INTRAVENOUS

## 2020-04-30 MED ORDER — TECHNETIUM TC 99M TETROFOSMIN IV KIT
31.2000 | PACK | Freq: Once | INTRAVENOUS | Status: AC | PRN
  Filled 2020-04-30: qty 32

## 2020-05-06 DIAGNOSIS — I10 Essential (primary) hypertension: Secondary | ICD-10-CM | POA: Diagnosis not present

## 2020-05-06 DIAGNOSIS — J439 Emphysema, unspecified: Secondary | ICD-10-CM | POA: Diagnosis not present

## 2020-05-06 DIAGNOSIS — K219 Gastro-esophageal reflux disease without esophagitis: Secondary | ICD-10-CM | POA: Diagnosis not present

## 2020-05-06 DIAGNOSIS — E213 Hyperparathyroidism, unspecified: Secondary | ICD-10-CM | POA: Diagnosis not present

## 2020-05-06 DIAGNOSIS — D510 Vitamin B12 deficiency anemia due to intrinsic factor deficiency: Secondary | ICD-10-CM | POA: Diagnosis not present

## 2020-05-06 DIAGNOSIS — I700 Atherosclerosis of aorta: Secondary | ICD-10-CM | POA: Diagnosis not present

## 2020-05-06 DIAGNOSIS — R1030 Lower abdominal pain, unspecified: Secondary | ICD-10-CM | POA: Diagnosis not present

## 2020-05-06 DIAGNOSIS — C3410 Malignant neoplasm of upper lobe, unspecified bronchus or lung: Secondary | ICD-10-CM | POA: Diagnosis not present

## 2020-05-06 DIAGNOSIS — Z0001 Encounter for general adult medical examination with abnormal findings: Secondary | ICD-10-CM | POA: Diagnosis not present

## 2020-05-06 DIAGNOSIS — M109 Gout, unspecified: Secondary | ICD-10-CM | POA: Diagnosis not present

## 2020-05-06 DIAGNOSIS — E559 Vitamin D deficiency, unspecified: Secondary | ICD-10-CM | POA: Diagnosis not present

## 2020-05-06 DIAGNOSIS — N183 Chronic kidney disease, stage 3 unspecified: Secondary | ICD-10-CM | POA: Diagnosis not present

## 2020-05-06 DIAGNOSIS — E46 Unspecified protein-calorie malnutrition: Secondary | ICD-10-CM | POA: Diagnosis not present

## 2020-05-14 DIAGNOSIS — N320 Bladder-neck obstruction: Secondary | ICD-10-CM | POA: Diagnosis not present

## 2020-05-20 ENCOUNTER — Encounter (HOSPITAL_COMMUNITY): Payer: Self-pay | Admitting: Internal Medicine

## 2020-05-20 ENCOUNTER — Other Ambulatory Visit: Payer: Self-pay

## 2020-05-20 ENCOUNTER — Emergency Department (HOSPITAL_COMMUNITY): Payer: Medicare HMO

## 2020-05-20 ENCOUNTER — Inpatient Hospital Stay (HOSPITAL_COMMUNITY)
Admission: EM | Admit: 2020-05-20 | Discharge: 2020-06-04 | DRG: 853 | Disposition: E | Payer: Medicare HMO | Attending: Pulmonary Disease | Admitting: Pulmonary Disease

## 2020-05-20 ENCOUNTER — Inpatient Hospital Stay (HOSPITAL_COMMUNITY): Payer: Medicare HMO

## 2020-05-20 ENCOUNTER — Emergency Department (HOSPITAL_BASED_OUTPATIENT_CLINIC_OR_DEPARTMENT_OTHER): Payer: Medicare HMO

## 2020-05-20 DIAGNOSIS — M7989 Other specified soft tissue disorders: Secondary | ICD-10-CM

## 2020-05-20 DIAGNOSIS — M009 Pyogenic arthritis, unspecified: Secondary | ICD-10-CM | POA: Diagnosis present

## 2020-05-20 DIAGNOSIS — E872 Acidosis, unspecified: Secondary | ICD-10-CM

## 2020-05-20 DIAGNOSIS — M726 Necrotizing fasciitis: Secondary | ICD-10-CM | POA: Diagnosis present

## 2020-05-20 DIAGNOSIS — R911 Solitary pulmonary nodule: Secondary | ICD-10-CM | POA: Diagnosis present

## 2020-05-20 DIAGNOSIS — N390 Urinary tract infection, site not specified: Secondary | ICD-10-CM | POA: Diagnosis present

## 2020-05-20 DIAGNOSIS — R739 Hyperglycemia, unspecified: Secondary | ICD-10-CM | POA: Diagnosis not present

## 2020-05-20 DIAGNOSIS — N179 Acute kidney failure, unspecified: Secondary | ICD-10-CM | POA: Diagnosis not present

## 2020-05-20 DIAGNOSIS — L03115 Cellulitis of right lower limb: Secondary | ICD-10-CM | POA: Diagnosis not present

## 2020-05-20 DIAGNOSIS — Z902 Acquired absence of lung [part of]: Secondary | ICD-10-CM

## 2020-05-20 DIAGNOSIS — Z91013 Allergy to seafood: Secondary | ICD-10-CM

## 2020-05-20 DIAGNOSIS — M2548 Effusion, other site: Secondary | ICD-10-CM | POA: Diagnosis not present

## 2020-05-20 DIAGNOSIS — I700 Atherosclerosis of aorta: Secondary | ICD-10-CM | POA: Diagnosis present

## 2020-05-20 DIAGNOSIS — Z8546 Personal history of malignant neoplasm of prostate: Secondary | ICD-10-CM | POA: Diagnosis not present

## 2020-05-20 DIAGNOSIS — H9190 Unspecified hearing loss, unspecified ear: Secondary | ICD-10-CM | POA: Diagnosis present

## 2020-05-20 DIAGNOSIS — N1831 Chronic kidney disease, stage 3a: Secondary | ICD-10-CM | POA: Diagnosis present

## 2020-05-20 DIAGNOSIS — Z681 Body mass index (BMI) 19 or less, adult: Secondary | ICD-10-CM

## 2020-05-20 DIAGNOSIS — I10 Essential (primary) hypertension: Secondary | ICD-10-CM | POA: Diagnosis present

## 2020-05-20 DIAGNOSIS — I129 Hypertensive chronic kidney disease with stage 1 through stage 4 chronic kidney disease, or unspecified chronic kidney disease: Secondary | ICD-10-CM | POA: Diagnosis present

## 2020-05-20 DIAGNOSIS — D62 Acute posthemorrhagic anemia: Secondary | ICD-10-CM | POA: Diagnosis present

## 2020-05-20 DIAGNOSIS — Z66 Do not resuscitate: Secondary | ICD-10-CM | POA: Diagnosis not present

## 2020-05-20 DIAGNOSIS — Z7982 Long term (current) use of aspirin: Secondary | ICD-10-CM

## 2020-05-20 DIAGNOSIS — Z20822 Contact with and (suspected) exposure to covid-19: Secondary | ICD-10-CM | POA: Diagnosis not present

## 2020-05-20 DIAGNOSIS — J449 Chronic obstructive pulmonary disease, unspecified: Secondary | ICD-10-CM | POA: Diagnosis present

## 2020-05-20 DIAGNOSIS — M1711 Unilateral primary osteoarthritis, right knee: Secondary | ICD-10-CM | POA: Diagnosis present

## 2020-05-20 DIAGNOSIS — Z923 Personal history of irradiation: Secondary | ICD-10-CM

## 2020-05-20 DIAGNOSIS — K219 Gastro-esophageal reflux disease without esophagitis: Secondary | ICD-10-CM | POA: Diagnosis present

## 2020-05-20 DIAGNOSIS — L538 Other specified erythematous conditions: Secondary | ICD-10-CM

## 2020-05-20 DIAGNOSIS — R54 Age-related physical debility: Secondary | ICD-10-CM | POA: Diagnosis present

## 2020-05-20 DIAGNOSIS — L02415 Cutaneous abscess of right lower limb: Secondary | ICD-10-CM | POA: Diagnosis present

## 2020-05-20 DIAGNOSIS — R000 Tachycardia, unspecified: Secondary | ICD-10-CM | POA: Diagnosis not present

## 2020-05-20 DIAGNOSIS — Z515 Encounter for palliative care: Secondary | ICD-10-CM | POA: Diagnosis not present

## 2020-05-20 DIAGNOSIS — F32A Depression, unspecified: Secondary | ICD-10-CM | POA: Diagnosis present

## 2020-05-20 DIAGNOSIS — Z87891 Personal history of nicotine dependence: Secondary | ICD-10-CM

## 2020-05-20 DIAGNOSIS — R64 Cachexia: Secondary | ICD-10-CM | POA: Diagnosis present

## 2020-05-20 DIAGNOSIS — C3411 Malignant neoplasm of upper lobe, right bronchus or lung: Secondary | ICD-10-CM | POA: Diagnosis present

## 2020-05-20 DIAGNOSIS — M869 Osteomyelitis, unspecified: Secondary | ICD-10-CM | POA: Diagnosis present

## 2020-05-20 DIAGNOSIS — A419 Sepsis, unspecified organism: Secondary | ICD-10-CM | POA: Diagnosis not present

## 2020-05-20 DIAGNOSIS — Y846 Urinary catheterization as the cause of abnormal reaction of the patient, or of later complication, without mention of misadventure at the time of the procedure: Secondary | ICD-10-CM | POA: Diagnosis present

## 2020-05-20 DIAGNOSIS — J9601 Acute respiratory failure with hypoxia: Secondary | ICD-10-CM | POA: Diagnosis not present

## 2020-05-20 DIAGNOSIS — D510 Vitamin B12 deficiency anemia due to intrinsic factor deficiency: Secondary | ICD-10-CM | POA: Diagnosis present

## 2020-05-20 DIAGNOSIS — Z978 Presence of other specified devices: Secondary | ICD-10-CM

## 2020-05-20 DIAGNOSIS — R6521 Severe sepsis with septic shock: Secondary | ICD-10-CM | POA: Diagnosis present

## 2020-05-20 DIAGNOSIS — D689 Coagulation defect, unspecified: Secondary | ICD-10-CM | POA: Diagnosis not present

## 2020-05-20 DIAGNOSIS — M109 Gout, unspecified: Secondary | ICD-10-CM | POA: Diagnosis present

## 2020-05-20 DIAGNOSIS — Z9911 Dependence on respirator [ventilator] status: Secondary | ICD-10-CM | POA: Diagnosis not present

## 2020-05-20 DIAGNOSIS — C61 Malignant neoplasm of prostate: Secondary | ICD-10-CM | POA: Diagnosis present

## 2020-05-20 DIAGNOSIS — M79604 Pain in right leg: Secondary | ICD-10-CM

## 2020-05-20 DIAGNOSIS — E860 Dehydration: Secondary | ICD-10-CM | POA: Diagnosis present

## 2020-05-20 DIAGNOSIS — I9589 Other hypotension: Secondary | ICD-10-CM | POA: Diagnosis not present

## 2020-05-20 DIAGNOSIS — T83518A Infection and inflammatory reaction due to other urinary catheter, initial encounter: Secondary | ICD-10-CM | POA: Diagnosis present

## 2020-05-20 DIAGNOSIS — E876 Hypokalemia: Secondary | ICD-10-CM | POA: Diagnosis not present

## 2020-05-20 DIAGNOSIS — Z85118 Personal history of other malignant neoplasm of bronchus and lung: Secondary | ICD-10-CM

## 2020-05-20 DIAGNOSIS — Z452 Encounter for adjustment and management of vascular access device: Secondary | ICD-10-CM | POA: Diagnosis not present

## 2020-05-20 DIAGNOSIS — M25461 Effusion, right knee: Secondary | ICD-10-CM | POA: Diagnosis not present

## 2020-05-20 DIAGNOSIS — R6520 Severe sepsis without septic shock: Secondary | ICD-10-CM | POA: Diagnosis present

## 2020-05-20 DIAGNOSIS — Z720 Tobacco use: Secondary | ICD-10-CM | POA: Diagnosis present

## 2020-05-20 DIAGNOSIS — I959 Hypotension, unspecified: Secondary | ICD-10-CM | POA: Diagnosis not present

## 2020-05-20 DIAGNOSIS — M11261 Other chondrocalcinosis, right knee: Secondary | ICD-10-CM | POA: Diagnosis present

## 2020-05-20 DIAGNOSIS — R0902 Hypoxemia: Secondary | ICD-10-CM | POA: Diagnosis not present

## 2020-05-20 DIAGNOSIS — Z4659 Encounter for fitting and adjustment of other gastrointestinal appliance and device: Secondary | ICD-10-CM

## 2020-05-20 DIAGNOSIS — R0689 Other abnormalities of breathing: Secondary | ICD-10-CM | POA: Diagnosis not present

## 2020-05-20 DIAGNOSIS — Z4682 Encounter for fitting and adjustment of non-vascular catheter: Secondary | ICD-10-CM | POA: Diagnosis not present

## 2020-05-20 DIAGNOSIS — E43 Unspecified severe protein-calorie malnutrition: Secondary | ICD-10-CM | POA: Insufficient documentation

## 2020-05-20 DIAGNOSIS — Z79899 Other long term (current) drug therapy: Secondary | ICD-10-CM

## 2020-05-20 DIAGNOSIS — M25561 Pain in right knee: Secondary | ICD-10-CM | POA: Diagnosis not present

## 2020-05-20 DIAGNOSIS — J9600 Acute respiratory failure, unspecified whether with hypoxia or hypercapnia: Secondary | ICD-10-CM

## 2020-05-20 DIAGNOSIS — E861 Hypovolemia: Secondary | ICD-10-CM | POA: Diagnosis not present

## 2020-05-20 LAB — CBC WITH DIFFERENTIAL/PLATELET
Abs Immature Granulocytes: 2.56 10*3/uL — ABNORMAL HIGH (ref 0.00–0.07)
Basophils Absolute: 0 10*3/uL (ref 0.0–0.1)
Basophils Relative: 0 %
Eosinophils Absolute: 0 10*3/uL (ref 0.0–0.5)
Eosinophils Relative: 0 %
HCT: 25.4 % — ABNORMAL LOW (ref 39.0–52.0)
Hemoglobin: 8.8 g/dL — ABNORMAL LOW (ref 13.0–17.0)
Immature Granulocytes: 4 %
Lymphocytes Relative: 1 %
Lymphs Abs: 0.6 10*3/uL — ABNORMAL LOW (ref 0.7–4.0)
MCH: 28 pg (ref 26.0–34.0)
MCHC: 34.6 g/dL (ref 30.0–36.0)
MCV: 80.9 fL (ref 80.0–100.0)
Monocytes Absolute: 2.4 10*3/uL — ABNORMAL HIGH (ref 0.1–1.0)
Monocytes Relative: 4 %
Neutro Abs: 56.3 10*3/uL — ABNORMAL HIGH (ref 1.7–7.7)
Neutrophils Relative %: 91 %
Platelets: 417 10*3/uL — ABNORMAL HIGH (ref 150–400)
RBC: 3.14 MIL/uL — ABNORMAL LOW (ref 4.22–5.81)
RDW: 15 % (ref 11.5–15.5)
WBC: 61.9 10*3/uL (ref 4.0–10.5)
nRBC: 0 % (ref 0.0–0.2)

## 2020-05-20 LAB — URINALYSIS, ROUTINE W REFLEX MICROSCOPIC
Bilirubin Urine: NEGATIVE
Glucose, UA: NEGATIVE mg/dL
Ketones, ur: NEGATIVE mg/dL
Nitrite: NEGATIVE
Protein, ur: 100 mg/dL — AB
Specific Gravity, Urine: 1.016 (ref 1.005–1.030)
WBC, UA: >50 WBC/hpf — ABNORMAL HIGH (ref 0–5)
pH: 5 (ref 5.0–8.0)

## 2020-05-20 LAB — COMPREHENSIVE METABOLIC PANEL
ALT: 12 U/L (ref 0–44)
AST: 46 U/L — ABNORMAL HIGH (ref 15–41)
Albumin: 1.4 g/dL — ABNORMAL LOW (ref 3.5–5.0)
Alkaline Phosphatase: 117 U/L (ref 38–126)
Anion gap: 21 — ABNORMAL HIGH (ref 5–15)
BUN: 87 mg/dL — ABNORMAL HIGH (ref 8–23)
CO2: 17 mmol/L — ABNORMAL LOW (ref 22–32)
Calcium: 8.5 mg/dL — ABNORMAL LOW (ref 8.9–10.3)
Chloride: 96 mmol/L — ABNORMAL LOW (ref 98–111)
Creatinine, Ser: 2.37 mg/dL — ABNORMAL HIGH (ref 0.61–1.24)
GFR, Estimated: 27 mL/min — ABNORMAL LOW (ref >60)
Glucose, Bld: 73 mg/dL (ref 70–99)
Potassium: 4.4 mmol/L (ref 3.5–5.1)
Sodium: 134 mmol/L — ABNORMAL LOW (ref 135–145)
Total Bilirubin: 2.4 mg/dL — ABNORMAL HIGH (ref 0.3–1.2)
Total Protein: 5.2 g/dL — ABNORMAL LOW (ref 6.5–8.1)

## 2020-05-20 LAB — PROTIME-INR
INR: 1.4 — ABNORMAL HIGH (ref 0.8–1.2)
Prothrombin Time: 17.3 seconds — ABNORMAL HIGH (ref 11.4–15.2)

## 2020-05-20 LAB — LACTIC ACID, PLASMA
Lactic Acid, Venous: 6.6 mmol/L (ref 0.5–1.9)
Lactic Acid, Venous: 6.6 mmol/L (ref 0.5–1.9)
Lactic Acid, Venous: 4.7 mmol/L (ref 0.5–1.9)
Lactic Acid, Venous: 4.5 mmol/L (ref 0.5–1.9)

## 2020-05-20 LAB — RESP PANEL BY RT-PCR (FLU A&B, COVID) ARPGX2
Influenza A by PCR: NEGATIVE
Influenza B by PCR: NEGATIVE
SARS Coronavirus 2 by RT PCR: NEGATIVE

## 2020-05-20 LAB — APTT: aPTT: 36 seconds (ref 24–36)

## 2020-05-20 MED ORDER — LACTATED RINGERS IV BOLUS (SEPSIS)
1000.0000 mL | Freq: Once | INTRAVENOUS | Status: AC
  Administered 2020-05-20: 1000 mL via INTRAVENOUS

## 2020-05-20 MED ORDER — SODIUM CHLORIDE 0.9 % IV BOLUS
1000.0000 mL | Freq: Once | INTRAVENOUS | Status: AC
  Administered 2020-05-20: 1000 mL via INTRAVENOUS

## 2020-05-20 MED ORDER — SODIUM CHLORIDE 0.9 % IV SOLN: 2.0000 g | INTRAVENOUS | Status: DC

## 2020-05-20 MED ORDER — LORAZEPAM 2 MG/ML IJ SOLN
0.5000 mg | Freq: Once | INTRAMUSCULAR | Status: AC
  Administered 2020-05-20: 0.5 mg via INTRAVENOUS
  Filled 2020-05-20: qty 1

## 2020-05-20 MED ORDER — LACTATED RINGERS IV SOLN: INTRAVENOUS | Status: DC

## 2020-05-20 MED ORDER — SODIUM CHLORIDE 0.9 % IV SOLN
2.0000 g | Freq: Once | INTRAVENOUS | Status: AC
  Administered 2020-05-20: 2 g via INTRAVENOUS
  Filled 2020-05-20: qty 2

## 2020-05-20 MED ORDER — VANCOMYCIN HCL 1250 MG/250ML IV SOLN
1250.0000 mg | Freq: Once | INTRAVENOUS | Status: AC
  Administered 2020-05-20: 1250 mg via INTRAVENOUS
  Filled 2020-05-20: qty 250

## 2020-05-20 MED ORDER — VANCOMYCIN HCL 1250 MG/250ML IV SOLN: 1250.0000 mg | INTRAVENOUS | Status: DC

## 2020-05-20 NOTE — H&P (Incomplete)
History and Physical    Jerry Wise TGG:[id number] DOB: Apr 17, 1940 DOA: 06/03/2020  PCP: Josetta Huddle, MD Patient coming from: Home  Chief Complaint: Leg infection  HPI: Jerry Wise is a 80 y.o. male with medical history significant of former tobacco use, lung cancer status post right upper lobectomy on surveillance, history of prostate cancer status post radioactive seed implants, chronic indwelling Foley catheter, COPD not on home oxygen, CKD stage IIIa, PVCs, hypertension, gout presented to the ED today from his PCPs office for evaluation of redness and swelling of his right leg.  He was hypotensive with EMS with systolic in the 70J.  EMS reported the patient was on Levaquin for UTI about 2 weeks ago and has a Foley catheter in place.  On arrival to the ED, patient was afebrile but tachypneic and hypotensive.  He had redness and swelling from the medial aspect of the proximal thigh extending distally.  Patient was seen by critical care and since his blood pressure has improved after IV fluid resuscitation, they felt he was appropriate for admission to progressive care unit. ED PA discussed the case with Dr. Stann Mainland from orthopedics who recommended admission for antibiotics and MRI of the right lower extremity.  If patient cannot tolerate MRI, he recommended obtaining CT.  MRI could not be done as patient had difficulty sitting still.  ED PA discussed CT findings with general surgery, recommended consulting orthopedics.  She subsequently spoke to orthopedics regarding CT findings.  Recommended keeping the patient n.p.o. with plans for consultation in the morning.     Foley catheter was changed in the ED.  ED Course: ***  Review of Systems:  All systems reviewed and apart from history of presenting illness, are negative.  Past Medical History:  Diagnosis Date  . Asthma    as child  . Cancer ([place])    lung cancer  . Chronic renal insufficiency    NEPHROLOGIST-  DR Jerry Wise -- LAST VISIT  MARCH 2014  . Dyspnea   . GERD (gastroesophageal reflux disease)   . Gout   . Gouty arthritis    ALL JOINTS  . History of kidney stones   . History of prostate cancer    S/P RADIACTIVE SEED IMPLANTS  . Hydronephrosis, bilateral   . Hypertension   . 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 Pernicious anemia FOLLOWED BY DR WEBB   x3 feraheme injection --- last one 03-22-2012  . Urethral stricture     Past Surgical History:  Procedure Laterality Date  . CLOSED REDUCTION CLAVICAL FX     NO HARDWARE  . COLONOSCOPY    . CYSTO/  BALLOON DILATION OF URETHRAL STRICTURE AND BLADDER BX WITH FULGERATION  09-14-2007  . CYSTOSCOPY W/ URETERAL STENT PLACEMENT N/A 04/06/2012   Procedure: CYSTOSCOPY Balloon DILATION OF STRICTURE, Fulgeration Bladder Neck, Cystogram;  Surgeon: Malka So, MD;  Location: [facility];  Service: Urology;  Laterality: N/A;  . DIRECT LARYNGOSCOPY N/A 03/03/2017   Procedure: DIRECT LARYNGOSCOPY WITH nasopharyngoscopy and BIOPSY;  Surgeon: Jerry Nunnery, MD;  Location: [place];  Service: ENT;  Laterality: N/A;  . IR THORACENTESIS ASP PLEURAL SPACE W/IMG GUIDE  06/19/2018  . KNEE SURGERY    . MEDIASTINOSCOPY N/A 03/03/2017   Procedure: MEDIASTINOSCOPY;  Surgeon: Jerry Nakayama, MD;  Location: [facility] OR;  Service: Thoracic;  Laterality: N/A;  . MULTIPLE TOOTH EXTRACTIONS    . PARATHYROIDECTOMY  Right 04/28/2012   Procedure:  RIGHT INFERIOR PARATHYROIDECTOMY;  Surgeon: Jerry Regal, MD;  Location: [facility];  Service: General;  Laterality: Right;  . RADIOACTIVE PROSTATE SEED IMPLANTS  2000   prostate cancer  . VIDEO ASSISTED THORACOSCOPY (VATS)/ LOBECTOMY Right 03/03/2017   Procedure: VIDEO ASSISTED THORACOSCOPY (VATS)/RIGHT UPPER LOBECTOMY;  Surgeon: Jerry Nakayama, MD;  Location: [place];  Service: Thoracic;  Laterality: Right;  Marland Kitchen VIDEO ASSISTED THORACOSCOPY (VATS)/WEDGE  RESECTION Right 03/21/2017   Procedure: REDO VIDEO ASSISTED THORACOSCOPY (VATS)/WEDGE RESECTION;  Surgeon: Jerry Nakayama, MD;  Location: [place];  Service: Thoracic;  Laterality: Right;  Marland Kitchen VIDEO BRONCHOSCOPY WITH ENDOBRONCHIAL NAVIGATION N/A 02/02/2017   Procedure: VIDEO BRONCHOSCOPY WITH ENDOBRONCHIAL NAVIGATION  with biopsies  and Fiducial Placement;  Surgeon: Jerry Gobble, MD;  Location: [place];  Service: Thoracic;  Laterality: N/A;  . VIDEO BRONCHOSCOPY WITH ENDOBRONCHIAL ULTRASOUND N/A 02/02/2017   Procedure: VIDEO BRONCHOSCOPY WITH ENDOBRONCHIAL ULTRASOUND  with node biopsies;  Surgeon: Jerry Gobble, MD;  Location: [place];  Service: Thoracic;  Laterality: N/A;     reports that he quit smoking about 3 years ago. His smoking use included cigarettes. He started smoking about 66 years ago. He has a 27.50 pack-year smoking history. He has never used smokeless tobacco. He reports current alcohol use of about 14.0 standard drinks of alcohol per week. He reports that he does not use drugs.  Allergies  Allergen Reactions  . Shellfish-Derived Products Other (See Comments)    History reviewed. No pertinent family history.  Prior to Admission medications   Medication Sig Start Date End Date Taking? Authorizing Provider  acetaminophen (TYLENOL) 500 MG tablet Take 1,000 mg by mouth every 8 (eight) hours as needed for mild pain or headache.   Yes [provider]  albuterol (VENTOLIN HFA) 108 (90 Base) MCG/ACT inhaler Inhale 2 puffs into the lungs every 6 (six) hours as needed for wheezing or shortness of breath. 04/08/20  Yes Jerry Gobble, MD  allopurinol (ZYLOPRIM) 300 MG tablet Take 300 mg by mouth daily.    Yes [provider]  amLODipine (NORVASC) 10 MG tablet Take 10 mg by mouth daily.    Yes [provider]  aspirin EC 81 MG tablet Take 81 mg by mouth daily.   Yes [provider]  Cholecalciferol 2000 units CAPS Take 2,000 Units by mouth daily.   Yes  [provider]  hydrochlorothiazide (MICROZIDE) 12.5 MG capsule Take 12.5 mg by mouth daily.   Yes [provider]  HYDROcodone-acetaminophen (NORCO/VICODIN) 5-325 MG tablet Take 1-2 tablets by mouth every 6 (six) hours as needed for moderate pain. 05/10/20  Yes [provider]  labetalol (NORMODYNE) 100 MG tablet Take 1 tablet (100 mg total) by mouth 2 (two) times daily. 03/27/20  Yes Chandrasekhar, Mahesh A, MD  PARoxetine (PAXIL) 10 MG tablet Take 10 mg by mouth daily. 12/12/19  Yes [provider]  Tiotropium Bromide-Olodaterol (STIOLTO RESPIMAT) 2.5-2.5 MCG/ACT AERS Inhale 2 puffs into the lungs daily. 04/08/20  Yes Jerry Gobble, MD  levofloxacin (LEVAQUIN) 500 MG tablet Take 500 mg by mouth See admin instructions. Qd x 7 days Patient not taking: No sig reported 05/08/20   [provider]  predniSONE (DELTASONE) 5 MG tablet Take 5 mg by mouth See admin instructions. 5 tabs daily for 2 days,then decrease by 1 tab every other day until gone. Patient not taking: No sig reported 05/06/20   [provider]    Physical  Exam: Vitals:   05/17/2020 1945 05/11/2020 2000 05/25/2020 2015 06/01/2020 2324  BP: 116/67 112/67 (!) 118/57 (!) 104/57  Pulse: 98 98 (!) 101 99  Resp: (!) 28 (!) 24 (!) 21 20  Temp:    98.3 F (36.8 C)  TempSrc:    Axillary  SpO2: 93% 93% 94% 93%  Weight:    65.3 kg  Height:    6\' 2"  (1.88 m)    ***  Labs on Admission: I have personally reviewed following labs and imaging studies  CBC: Recent Labs  Lab 05/19/2020 1135  WBC 61.9*  NEUTROABS 56.3*  HGB 8.8*  HCT 25.4*  MCV 80.9  PLT 431*   Basic Metabolic Panel: Recent Labs  Lab 05/23/2020 1135  NA 134*  K 4.4  CL 96*  CO2 17*  GLUCOSE 73  BUN 87*  CREATININE 2.37*  CALCIUM 8.5*   GFR: Estimated Creatinine Clearance: 23 mL/min (A) (by C-G formula based on SCr of 2.37 mg/dL (H)). Liver Function Tests: Recent Labs  Lab 05/22/2020 1135  AST 46*  ALT 12  ALKPHOS  117  BILITOT 2.4*  PROT 5.2*  ALBUMIN 1.4*   No results for input(s): LIPASE, AMYLASE in the last 168 hours. No results for input(s): AMMONIA in the last 168 hours. Coagulation Profile: Recent Labs  Lab 05/08/2020 1135  INR 1.4*   Cardiac Enzymes: No results for input(s): CKTOTAL, CKMB, CKMBINDEX, TROPONINI in the last 168 hours. BNP (last 3 results) No results for input(s): PROBNP in the last 8760 hours. HbA1C: No results for input(s): HGBA1C in the last 72 hours. CBG: No results for input(s): GLUCAP in the last 168 hours. Lipid Profile: No results for input(s): CHOL, HDL, LDLCALC, TRIG, CHOLHDL, LDLDIRECT in the last 72 hours. Thyroid Function Tests: No results for input(s): TSH, T4TOTAL, FREET4, T3FREE, THYROIDAB in the last 72 hours. Anemia Panel: No results for input(s): VITAMINB12, FOLATE, FERRITIN, TIBC, IRON, RETICCTPCT in the last 72 hours. Urine analysis:    Component Value Date/Time   COLORURINE AMBER (A) 05/10/2020 1345   APPEARANCEUR TURBID (A) 05/19/2020 1345   LABSPEC 1.016 05/18/2020 1345   PHURINE 5.0 05/19/2020 1345   GLUCOSEU NEGATIVE 06/03/2020 1345   HGBUR MODERATE (A) 05/30/2020 1345   BILIRUBINUR NEGATIVE 06/02/2020 1345   KETONESUR NEGATIVE 05/23/2020 1345   PROTEINUR 100 (A) 05/05/2020 1345   NITRITE NEGATIVE 05/27/2020 1345   LEUKOCYTESUR LARGE (A) 05/10/2020 1345    Radiological Exams on Admission: CT Knee Right Wo Contrast  Result Date: 05/28/2020 CLINICAL DATA:  Pain and swelling EXAM: CT OF THE right KNEE WITHOUT CONTRAST TECHNIQUE: Multidetector CT imaging of the right knee was performed according to the standard protocol. Multiplanar CT image reconstructions were also generated. COMPARISON:  None available FINDINGS: Bones/Joint/Cartilage Moderate to large knee effusion containing scattered foci of gas. No fracture or malalignment is visualized. Diffuse joint space calcifications. Moderate severe arthritis involving the medial tibiofemoral  compartment with joint space narrowing, subarticular sclerosis and subarticular cysts. Question early erosive change along the medial joint margin, series 6, image 51, cortex slightly irregular and indistinct. Moderate arthritis of the lateral tibiofemoral joint space with prominent spurring. Ligaments Suboptimally assessed by CT. Muscles and Tendons Quadriceps tendon grossly intact. Undulating appearance of the patellar ligament. Extensive gas collections within the visible portions of the vastus medialis muscle at the distal thigh with additional gas collections visualized in the medial vastus muscles as well. Cranial extent of gas is incompletely included. Soft tissues Extensive subcutaneous edema. Mild rim  enhancing gas and fluid collection along the medial aspect of the knee measuring approximately 5.1 cm AP by 1.7 cm transverse by 5.6 cm craniocaudad, suspicious for abscess. Coarse probable dystrophic calcifications in the prepatellar soft tissues. IMPRESSION: 1. Extensive gas collections within the medial and lateral vastus muscles of the distal thigh, concerning for necrotizing infection. Cranial extent of gas is incompletely visualized. Moderate to large knee effusion also containing gas bubbles, raising concern for joint space involvement of infection. Generalized edema and cellulitis about the knee with 5.6 cm mildly rim enhancing gas and fluid collection along the medial aspect of the knee concerning for soft tissue abscess. 2. Arthritis of the knee with chondrocalcinosis. Slightly irregular for joint space margin medial side of the knee, raising concern for osteomyelitis/septic arthritis given above findings, correlation with MRI is recommended. Critical Value/emergent results were called by telephone at the time of interpretation on 05/04/2020 at 11:20 pm to provider [facility] , who verbally acknowledged these results. Electronically Signed   By: Donavan Foil M.D.   On: 05/11/2020 23:20   DG  Chest Port 1 View  Result Date: 05/31/2020 CLINICAL DATA:  Sepsis. EXAM: PORTABLE CHEST 1 VIEW COMPARISON:  June 27, 2018. FINDINGS: The heart size and mediastinal contours are within normal limits. No pneumothorax or pleural effusion is noted. Left lung is clear. Stable postsurgical changes and pleural thickening is seen involving the right lung. No definite acute abnormality is noted. The visualized skeletal structures are unremarkable. IMPRESSION: No active disease. Electronically Signed   By: Marijo Conception M.D.   On: 05/18/2020 12:16   VAS [place] LOWER EXTREMITY VENOUS (DVT) (ONLY [facility] & [facility] 7a-7p)  Result Date: 05/28/2020  Lower Venous DVT Study Patient Name:  OLLIVANDER SEE  Date of Exam:   05/16/2020 Medical Rec #: [id number]      Accession #:    [id number] Date of Birth: 03-May-1940      Patient Gender: M Patient Age:   80Y Exam Location:  [facility] Procedure:      VAS [place] LOWER EXTREMITY VENOUS (DVT) Referring Phys: [id number] [facility] PATEL --------------------------------------------------------------------------------  Indications: Pain, Erythema, and Swelling.  Comparison Study: No prior studies. Performing Technologist: Darlin Coco RDMS,RVT  Examination Guidelines: A complete evaluation includes B-mode imaging, spectral Doppler, color Doppler, and power Doppler as needed of all accessible portions of each vessel. Bilateral testing is considered an integral part of a complete examination. Limited examinations for reoccurring indications may be performed as noted. The reflux portion of the exam is performed with the patient in reverse Trendelenburg.  +---------+---------------+---------+-----------+----------+--------------+ RIGHT    CompressibilityPhasicitySpontaneityPropertiesThrombus Aging +---------+---------------+---------+-----------+----------+--------------+ CFV      Full           Yes      Yes                                  +---------+---------------+---------+-----------+----------+--------------+ SFJ      Full                                                        +---------+---------------+---------+-----------+----------+--------------+ FV Prox  Full                                                        +---------+---------------+---------+-----------+----------+--------------+  FV Mid   Full                                                        +---------+---------------+---------+-----------+----------+--------------+ FV DistalFull                                                        +---------+---------------+---------+-----------+----------+--------------+ PFV      Full                                                        +---------+---------------+---------+-----------+----------+--------------+ POP      Full           Yes      Yes                                 +---------+---------------+---------+-----------+----------+--------------+ PTV      Full                                                        +---------+---------------+---------+-----------+----------+--------------+ PERO     Full                                                        +---------+---------------+---------+-----------+----------+--------------+   +----+---------------+---------+-----------+----------+--------------+ LEFTCompressibilityPhasicitySpontaneityPropertiesThrombus Aging +----+---------------+---------+-----------+----------+--------------+ CFV Full           Yes      Yes                                 +----+---------------+---------+-----------+----------+--------------+     Summary: RIGHT: - There is no evidence of deep vein thrombosis in the lower extremity.  - No cystic structure found in the popliteal fossa.  - Incidental: Large, irregular focal collection containing mixed echogenicities identified from the proximal to distal medial thigh.  LEFT: - No evidence  of common femoral vein obstruction.  *See table(s) above for measurements and observations. Electronically signed by Deitra Mayo MD on 05/12/2020 at 3:45:30 PM.    Final     EKG: Independently reviewed. ***  Assessment/Plan Active Problems:   Severe sepsis (HCC)     *** HIV screening*** The patient falls between the ages of 13-64 and should be screened for HIV, therefore HIV testing ordered.  DVT prophylaxis: ***  Code Status: *** Family Communication: ***  Disposition Plan: Status is: Inpatient  {Inpatient:23812}  Dispo: The patient is from: {From:23814}              Anticipated d/c is to: {To:23815}  Patient currently {Medically stable:23817}   Difficult to place patient {Yes/No:25151}       Consults called: *** Admission status: *** Level of care: Level of care: Progressive The medical decision making on this patient was of high complexity and the patient is at high risk for clinical deterioration, therefore this is a level 3 visit.***  The medical decision making is of moderate complexity, therefore this is a level 2 visit.***  Shela Leff MD [facility]  If 7PM-7AM, please contact night-coverage [email or web address]  05/06/2020, 11:50 PM

## 2020-05-20 NOTE — ED Notes (Signed)
Vascular tech at bedside

## 2020-05-20 NOTE — Progress Notes (Addendum)
Pharmacy Antibiotic Note  Jerry Wise is a 80 y.o. male admitted on 05/25/2020 with sepsis.  Pharmacy has been consulted for vancomycin and cefepime dosing.  Has infection in leg and was being seen at PCP office - found to be hypotensive. CXR showing no acute processes. UA neg nitrite, WBC >50, and 11-20 squamous epithelial cells. WBC 61.9, LA 6.6, temp 36.3. Scr 2.37 (CrCl 21.5 mL/min).   Plan: Vancomycin 1250 mg IV every 48 hours (estAUC 499) Cefepime 2g IV every 24 hours  Monitor renal fx, cx results, clinical pic, and LOT   Height: 6\' 2"  (188 cm) Weight: 61.2 kg (135 lb) IBW/kg (Calculated) : 82.2  Temp (24hrs), Avg:97.4 F (36.3 C), Min:97.4 F (36.3 C), Max:97.4 F (36.3 C)  No results for input(s): WBC, CREATININE, LATICACIDVEN, VANCOTROUGH, VANCOPEAK, VANCORANDOM, GENTTROUGH, GENTPEAK, GENTRANDOM, TOBRATROUGH, TOBRAPEAK, TOBRARND, AMIKACINPEAK, AMIKACINTROU, AMIKACIN in the last 168 hours.  CrCl cannot be calculated (Patient's most recent lab result is older than the maximum 21 days allowed.).    Allergies  Allergen Reactions  . Shellfish-Derived Products Other (See Comments)    Antimicrobials this admission: Vancomycin 5/17 >>  Cefepime 5/17 >>   Dose adjustments this admission: N/A  Microbiology results: 5/17 BCx: sent 5/17 UCx: sent  5/17 COVID PCR: neg  Thank you for allowing pharmacy to be a part of this patient's care.  Antonietta Jewel, PharmD, BCCCP Clinical Pharmacist  Phone: [phone] 05/15/2020 2:09 PM  Please check AMION for all [place] phone numbers After 10:00 PM, call [place] [phone]

## 2020-05-20 NOTE — Sepsis Progress Note (Signed)
Notified bedside nurse of need to draw repeat lactic acid. 

## 2020-05-20 NOTE — H&P (Signed)
History and Physical    Jerry Wise DQQ:[id number] DOB: [birth year] DOA: 05/16/2020  PCP: Josetta Huddle, MD Patient coming from: Home  Chief Complaint: Leg infection  HPI: Jerry Wise is a 80 y.o. male with medical history significant of former tobacco use, lung cancer status post right upper lobectomy on surveillance, history of prostate cancer status post radioactive seed implants, chronic indwelling Foley catheter, COPD not on home oxygen, CKD stage IIIa, PVCs, hypertension, gout presented to the ED today from his PCPs office for evaluation of redness and swelling of his right leg.  He was hypotensive with EMS with systolic in the 19E.  EMS reported the patient was on Levaquin for UTI about 2 weeks ago and has a Foley catheter in place.  On arrival to the ED, patient was afebrile but tachypneic and hypotensive.  He had redness and swelling from the medial aspect of the proximal thigh extending distally.  Labs showing WBC 17.4 with neutrophilic predominance, hemoglobin 8.8 (stable), platelet count 417K.  Sodium 134, potassium 4.4, chloride 96, bicarb 17, anion gap 21, BUN 87, creatinine 2.3 (baseline 1.4), glucose 73.  Albumin 1.4.  T bili 2.4, no significant elevation of remainder of LFTs.  Lactic acid 6.6.  INR 1.4.  COVID and influenza PCR negative.  Blood culture x2 pending.  UA with large amount of leukocytes, greater than 50 WBCs, and few bacteria.  Urine culture pending.  Chest x-ray showing no active disease. Patient was seen by critical care and since his blood pressure had improved after IV fluid resuscitation, they felt he was appropriate for admission to progressive care unit.  Right lower extremity Doppler negative for DVT but showing large, irregular focal collection containing mixed echogenicities identified from the proximal to distal medial thigh.  ED PA discussed the case with Dr. Stann Mainland from orthopedics who recommended admission for antibiotics and MRI of the right lower  extremity.  If patient cannot tolerate MRI, he recommended obtaining CT.  MRI could not be done as patient had difficulty sitting still.    CT of right knee without contrast showing extensive gas collections within the medial and lateral vastus muscles of the distal thigh concerning for necrotizing infection.  Also showing moderate to large knee effusion containing gas bubbles concerning for joint space involvement of infection.  Showing generalized edema and cellulitis about the knee with 5.6 cm mildly rim enhancing gas and fluid collection along the medial aspect of the knee concerning for soft tissue abscess.  Arthritis of the knee with chondrocalcinosis.  Slightly irregular for joint space margin medial side of the knee raising concern for osteomyelitis/septic arthritis.  ED PA discussed CT findings with general surgery, recommended consulting orthopedics.  She spoke to orthopedics regarding CT findings, recommended keeping the patient n.p.o. with plans for consultation in the morning.  Patient received vancomycin, cefepime, and 3 fluid boluses.  Lactate improved to 4.5 after fluid boluses. Foley catheter was changed in the ED.  Patient was given Ativan in the ED prior to MRI and is currently somnolent.  No history could be obtained from him.  Review of Systems:  All systems reviewed and apart from history of presenting illness, are negative.  Past Medical History:  Diagnosis Date  . Asthma    as child  . Cancer ([place])    lung cancer  . Chronic renal insufficiency    NEPHROLOGIST-  DR Justin Mend -- LAST VISIT MARCH 2014  . Dyspnea   . GERD (gastroesophageal reflux disease)   .  Gout   . Gouty arthritis    ALL JOINTS  . History of kidney stones   . History of prostate cancer    S/P RADIACTIVE SEED IMPLANTS  . Hydronephrosis, bilateral   . Hypertension   . 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 Pernicious anemia  FOLLOWED BY DR WEBB   x3 feraheme injection --- last one 03-22-2012  . Urethral stricture     Past Surgical History:  Procedure Laterality Date  . CLOSED REDUCTION CLAVICAL FX     NO HARDWARE  . COLONOSCOPY    . CYSTO/  BALLOON DILATION OF URETHRAL STRICTURE AND BLADDER BX WITH FULGERATION  09-14-2007  . CYSTOSCOPY W/ URETERAL STENT PLACEMENT N/A 04/06/2012   Procedure: CYSTOSCOPY Balloon DILATION OF STRICTURE, Fulgeration Bladder Neck, Cystogram;  Surgeon: Malka So, MD;  Location: [facility];  Service: Urology;  Laterality: N/A;  . DIRECT LARYNGOSCOPY N/A 03/03/2017   Procedure: DIRECT LARYNGOSCOPY WITH nasopharyngoscopy and BIOPSY;  Surgeon: Rozetta Nunnery, MD;  Location: [facility];  Service: ENT;  Laterality: N/A;  . IR THORACENTESIS ASP PLEURAL SPACE W/IMG GUIDE  06/19/2018  . KNEE SURGERY    . MEDIASTINOSCOPY N/A 03/03/2017   Procedure: MEDIASTINOSCOPY;  Surgeon: [name], MD;  [facility] OR;  Service: Thoracic;  Laterality: N/A;  . MULTIPLE TOOTH EXTRACTIONS    . PARATHYROIDECTOMY Right 04/28/2012   Procedure:  RIGHT INFERIOR PARATHYROIDECTOMY;  Surgeon: Earnstine Regal, MD;  Location: [facility];  Service: General;  Laterality: Right;  . RADIOACTIVE PROSTATE SEED IMPLANTS  2000   prostate cancer  . VIDEO ASSISTED THORACOSCOPY (VATS)/ LOBECTOMY Right 03/03/2017   Procedure: VIDEO ASSISTED THORACOSCOPY (VATS)/RIGHT UPPER LOBECTOMY;  Surgeon: [name], MD;  Location: [place];  Service: Thoracic;  Laterality: Right;  Marland Kitchen VIDEO ASSISTED THORACOSCOPY (VATS)/WEDGE RESECTION Right 03/21/2017   Procedure: REDO VIDEO ASSISTED THORACOSCOPY (VATS)/WEDGE RESECTION;  Surgeon: [name], MD;  Location: [place];  Service: Thoracic;  Laterality: Right;  Marland Kitchen VIDEO BRONCHOSCOPY WITH ENDOBRONCHIAL NAVIGATION N/A 02/02/2017   Procedure: VIDEO BRONCHOSCOPY WITH ENDOBRONCHIAL NAVIGATION  with biopsies  and Fiducial Placement;  Surgeon: Collene Gobble, MD;   Location: [place];  Service: Thoracic;  Laterality: N/A;  . VIDEO BRONCHOSCOPY WITH ENDOBRONCHIAL ULTRASOUND N/A 02/02/2017   Procedure: VIDEO BRONCHOSCOPY WITH ENDOBRONCHIAL ULTRASOUND  with node biopsies;  Surgeon: Collene Gobble, MD;  Location: [place];  Service: Thoracic;  Laterality: N/A;     reports that he quit smoking about 3 years ago. His smoking use included cigarettes. He started smoking about 66 years ago. He has a 27.50 pack-year smoking history. He has never used smokeless tobacco. He reports current alcohol use of about 14.0 standard drinks of alcohol per week. He reports that he does not use drugs.  Allergies  Allergen Reactions  . Shellfish-Derived Products Other (See Comments)    History reviewed. No pertinent family history.  Prior to Admission medications   Medication Sig Start Date End Date Taking? Authorizing Provider  acetaminophen (TYLENOL) 500 MG tablet Take 1,000 mg by mouth every 8 (eight) hours as needed for mild pain or headache.   Yes [provider]  albuterol (VENTOLIN HFA) 108 (90 Base) MCG/ACT inhaler Inhale 2 puffs into the lungs every 6 (six) hours as needed for wheezing or shortness of breath. 04/08/20  Yes Collene Gobble, MD  allopurinol (ZYLOPRIM) 300 MG tablet Take 300 mg by  mouth daily.    Yes [provider]  amLODipine (NORVASC) 10 MG tablet Take 10 mg by mouth daily.    Yes [provider]  aspirin EC 81 MG tablet Take 81 mg by mouth daily.   Yes [provider]  Cholecalciferol 2000 units CAPS Take 2,000 Units by mouth daily.   Yes [provider]  hydrochlorothiazide (MICROZIDE) 12.5 MG capsule Take 12.5 mg by mouth daily.   Yes [provider]  HYDROcodone-acetaminophen (NORCO/VICODIN) 5-325 MG tablet Take 1-2 tablets by mouth every 6 (six) hours as needed for moderate pain. 05/10/20  Yes [provider]  labetalol (NORMODYNE) 100 MG tablet Take 1 tablet (100 mg total) by mouth 2 (two)  times daily. 03/27/20  Yes Chandrasekhar, Mahesh A, MD  PARoxetine (PAXIL) 10 MG tablet Take 10 mg by mouth daily. 12/12/19  Yes [provider]  Tiotropium Bromide-Olodaterol (STIOLTO RESPIMAT) 2.5-2.5 MCG/ACT AERS Inhale 2 puffs into the lungs daily. 04/08/20  Yes Collene Gobble, MD  levofloxacin (LEVAQUIN) 500 MG tablet Take 500 mg by mouth See admin instructions. Qd x 7 days Patient not taking: No sig reported 05/08/20   [provider]  predniSONE (DELTASONE) 5 MG tablet Take 5 mg by mouth See admin instructions. 5 tabs daily for 2 days,then decrease by 1 tab every other day until gone. Patient not taking: No sig reported 05/06/20   [provider]    Physical Exam: Vitals:   05/07/2020 1945 05/17/2020 2000 05/26/2020 2015 05/18/2020 2324  BP: 116/67 112/67 (!) 118/57 (!) 104/57  Pulse: 98 98 (!) 101 99  Resp: (!) 28 (!) 24 (!) 21 20  Temp:    98.3 F (36.8 C)  TempSrc:    Axillary  SpO2: 93% 93% 94% 93%  Weight:    65.3 kg  Height:    6\' 2"  (1.88 m)    Physical Exam Constitutional:      General: He is not in acute distress. HENT:     Head: Normocephalic and atraumatic.  Cardiovascular:     Rate and Rhythm: Regular rhythm.     Pulses: Normal pulses.     Comments: Mildly tachycardic Pulmonary:     Effort: Pulmonary effort is normal. No respiratory distress.     Breath sounds: Normal breath sounds. No wheezing or rales.  Abdominal:     General: Bowel sounds are normal. There is no distension.     Palpations: Abdomen is soft.     Tenderness: There is no abdominal tenderness.  Musculoskeletal:     Cervical back: Normal range of motion and neck supple.     Right lower leg: Edema present.     Comments: Right lower extremity: Significant edema of the thigh and knee.  Erythema on the medial aspect of the upper thigh extending distally.  Skin:    General: Skin is warm and dry.  Neurological:     Mental Status: He is alert.     Comments: Somnolent              Labs on Admission: I have personally reviewed following labs and imaging studies  CBC: Recent Labs  Lab 05/29/2020 1135  WBC 61.9*  NEUTROABS 56.3*  HGB 8.8*  HCT 25.4*  MCV 80.9  PLT 992*   Basic Metabolic Panel: Recent Labs  Lab 05/30/2020 1135  NA 134*  K 4.4  CL 96*  CO2 17*  GLUCOSE 73  BUN 87*  CREATININE 2.37*  CALCIUM 8.5*   GFR:  Estimated Creatinine Clearance: 23 mL/min (A) (by C-G formula based on SCr of 2.37 mg/dL (H)). Liver Function Tests: Recent Labs  Lab 05/30/2020 1135  AST 46*  ALT 12  ALKPHOS 117  BILITOT 2.4*  PROT 5.2*  ALBUMIN 1.4*   No results for input(s): LIPASE, AMYLASE in the last 168 hours. No results for input(s): AMMONIA in the last 168 hours. Coagulation Profile: Recent Labs  Lab 05/30/2020 1135  INR 1.4*   Cardiac Enzymes: No results for input(s): CKTOTAL, CKMB, CKMBINDEX, TROPONINI in the last 168 hours. BNP (last 3 results) No results for input(s): PROBNP in the last 8760 hours. HbA1C: No results for input(s): HGBA1C in the last 72 hours. CBG: No results for input(s): GLUCAP in the last 168 hours. Lipid Profile: No results for input(s): CHOL, HDL, LDLCALC, TRIG, CHOLHDL, LDLDIRECT in the last 72 hours. Thyroid Function Tests: No results for input(s): TSH, T4TOTAL, FREET4, T3FREE, THYROIDAB in the last 72 hours. Anemia Panel: No results for input(s): VITAMINB12, FOLATE, FERRITIN, TIBC, IRON, RETICCTPCT in the last 72 hours. Urine analysis:    Component Value Date/Time   COLORURINE AMBER (A) 05/12/2020 1345   APPEARANCEUR TURBID (A) 05/09/2020 1345   LABSPEC 1.016 06/01/2020 1345   PHURINE 5.0 06/02/2020 1345   GLUCOSEU NEGATIVE 05/25/2020 1345   HGBUR MODERATE (A) 05/23/2020 1345   BILIRUBINUR NEGATIVE 05/08/2020 1345   KETONESUR NEGATIVE 05/16/2020 1345   PROTEINUR 100 (A) 05/28/2020 1345   NITRITE NEGATIVE 05/16/2020 1345   LEUKOCYTESUR LARGE (A) 05/16/2020 1345    Radiological Exams on  Admission: CT Knee Right Wo Contrast  Result Date: 05/30/2020 CLINICAL DATA:  Pain and swelling EXAM: CT OF THE right KNEE WITHOUT CONTRAST TECHNIQUE: Multidetector CT imaging of the right knee was performed according to the standard protocol. Multiplanar CT image reconstructions were also generated. COMPARISON:  None available FINDINGS: Bones/Joint/Cartilage Moderate to large knee effusion containing scattered foci of gas. No fracture or malalignment is visualized. Diffuse joint space calcifications. Moderate severe arthritis involving the medial tibiofemoral compartment with joint space narrowing, subarticular sclerosis and subarticular cysts. Question early erosive change along the medial joint margin, series 6, image 51, cortex slightly irregular and indistinct. Moderate arthritis of the lateral tibiofemoral joint space with prominent spurring. Ligaments Suboptimally assessed by CT. Muscles and Tendons Quadriceps tendon grossly intact. Undulating appearance of the patellar ligament. Extensive gas collections within the visible portions of the vastus medialis muscle at the distal thigh with additional gas collections visualized in the medial vastus muscles as well. Cranial extent of gas is incompletely included. Soft tissues Extensive subcutaneous edema. Mild rim enhancing gas and fluid collection along the medial aspect of the knee measuring approximately 5.1 cm AP by 1.7 cm transverse by 5.6 cm craniocaudad, suspicious for abscess. Coarse probable dystrophic calcifications in the prepatellar soft tissues. IMPRESSION: 1. Extensive gas collections within the medial and lateral vastus muscles of the distal thigh, concerning for necrotizing infection. Cranial extent of gas is incompletely visualized. Moderate to large knee effusion also containing gas bubbles, raising concern for joint space involvement of infection. Generalized edema and cellulitis about the knee with 5.6 cm mildly rim enhancing gas and fluid  collection along the medial aspect of the knee concerning for soft tissue abscess. 2. Arthritis of the knee with chondrocalcinosis. Slightly irregular for joint space margin medial side of the knee, raising concern for osteomyelitis/septic arthritis given above findings, correlation with MRI is recommended. Critical Value/emergent results were called by telephone at the time of interpretation on  05/31/2020 at 11:20 pm to provider [facility] , who verbally acknowledged these results. Electronically Signed   By: Donavan Foil M.D.   On: 05/25/2020 23:20   DG Chest Port 1 View  Result Date: 05/14/2020 CLINICAL DATA:  Sepsis. EXAM: PORTABLE CHEST 1 VIEW COMPARISON:  June 27, 2018. FINDINGS: The heart size and mediastinal contours are within normal limits. No pneumothorax or pleural effusion is noted. Left lung is clear. Stable postsurgical changes and pleural thickening is seen involving the right lung. No definite acute abnormality is noted. The visualized skeletal structures are unremarkable. IMPRESSION: No active disease. Electronically Signed   By: Marijo Conception M.D.   On: 05/04/2020 12:16   VAS [place] LOWER EXTREMITY VENOUS (DVT) (ONLY [facility] & [facility] 7a-7p)  Result Date: 05/12/2020  Lower Venous DVT Study Patient Name:  Jerry Wise  Date of Exam:   06/01/2020 Medical Rec #: [id number]      Accession #:    [id number] Date of Birth: 1941/01/04      Patient Gender: M Patient Age:   80Y Exam Location:  [facility] Procedure:      VAS [place] LOWER EXTREMITY VENOUS (DVT) Referring Phys: [id number] [facility] PATEL --------------------------------------------------------------------------------  Indications: Pain, Erythema, and Swelling.  Comparison Study: No prior studies. Performing Technologist: Darlin Coco RDMS,RVT  Examination Guidelines: A complete evaluation includes B-mode imaging, spectral Doppler, color Doppler, and power Doppler as needed of all accessible portions of each vessel. Bilateral testing is  considered an integral part of a complete examination. Limited examinations for reoccurring indications may be performed as noted. The reflux portion of the exam is performed with the patient in reverse Trendelenburg.  +---------+---------------+---------+-----------+----------+--------------+ RIGHT    CompressibilityPhasicitySpontaneityPropertiesThrombus Aging +---------+---------------+---------+-----------+----------+--------------+ CFV      Full           Yes      Yes                                 +---------+---------------+---------+-----------+----------+--------------+ SFJ      Full                                                        +---------+---------------+---------+-----------+----------+--------------+ FV Prox  Full                                                        +---------+---------------+---------+-----------+----------+--------------+ FV Mid   Full                                                        +---------+---------------+---------+-----------+----------+--------------+ FV DistalFull                                                        +---------+---------------+---------+-----------+----------+--------------+ PFV  Full                                                        +---------+---------------+---------+-----------+----------+--------------+ POP      Full           Yes      Yes                                 +---------+---------------+---------+-----------+----------+--------------+ PTV      Full                                                        +---------+---------------+---------+-----------+----------+--------------+ PERO     Full                                                        +---------+---------------+---------+-----------+----------+--------------+   +----+---------------+---------+-----------+----------+--------------+  LEFTCompressibilityPhasicitySpontaneityPropertiesThrombus Aging +----+---------------+---------+-----------+----------+--------------+ CFV Full           Yes      Yes                                 +----+---------------+---------+-----------+----------+--------------+     Summary: RIGHT: - There is no evidence of deep vein thrombosis in the lower extremity.  - No cystic structure found in the popliteal fossa.  - Incidental: Large, irregular focal collection containing mixed echogenicities identified from the proximal to distal medial thigh.  LEFT: - No evidence of common femoral vein obstruction.  *See table(s) above for measurements and observations. Electronically signed by Deitra Mayo MD on 05/28/2020 at 3:45:30 PM.    Final     EKG: Independently reviewed.  Sinus rhythm, PVCs  Assessment/Plan Principal Problem:   Necrotizing soft tissue infection Active Problems:   Severe sepsis (HCC)   Catheter-associated urinary tract infection (HCC)   AKI (acute kidney injury) ([place])   Metabolic acidosis   Severe sepsis secondary to right lower extremity necrotizing soft tissue infection with concern for abscess, osteomyelitis, knee septic arthritis Tachypneic and hypotensive on arrival to the ED.  Labs showing significant leukocytosis (WBC 94.8 with neutrophilic predominance) and significant lactic acidosis (lactic acid 6.6).  CT of right knee without contrast showing extensive gas collections within the medial and lateral vastus muscles of the distal thigh concerning for necrotizing infection.  Also showing moderate to large knee effusion containing gas bubbles concerning for joint space involvement of infection.  Showing generalized edema and cellulitis about the knee with 5.6 cm mildly rim enhancing gas and fluid collection along the medial aspect of the knee concerning for soft tissue abscess.  Arthritis of the knee with chondrocalcinosis.  Slightly irregular for joint space margin  medial side of the knee raising concern for osteomyelitis/septic arthritis. -Vancomycin, Zosyn, and clindamycin.  Patient received 3 L fluid boluses in the ED.  Blood pressure has now improved and lactate trending down.  Blood culture x2 pending.  Trend lactate and WBC count.  Keep n.p.o. General surgery recommended consulting orthopedics.  Orthopedics consulted and will see the patient in the morning.  Possible catheter associated urinary tract infection Also possibly contributing to severe sepsis.  Has a chronic indwelling Foley catheter and UA showing large amount of leukocytes, greater than 50 WBCs, and few bacteria.  Reportedly treated for UTI with Levaquin 2 weeks ago.  Foley was changed in the ED. -Continue antibiotics.  Urine culture pending.  AKI on CKD stage IIIa Likely prerenal azotemia from hypotension in the setting of severe sepsis.  Appears dehydrated on exam. BUN 87, creatinine 2.3 (baseline 1.4). -IV fluid hydration and continue to monitor renal function.  Avoid nephrotoxic agents/contrast.  High anion gap metabolic acidosis Likely due to AKI.  Bicarb 17, anion gap 21. -IV fluid hydration, continue to monitor  Mild hyperbilirubinemia Possibly due to severe sepsis.  T bili 2.4, no significant elevation of remainder of LFTs. -Check fractionated bilirubin levels, continue to monitor  COPD Stable.  No signs of acute exacerbation. -Continue home inhalers  Gout -Continue allopurinol  Depression -Continue Paxil  Alcohol use -CIWA monitoring  DVT prophylaxis: SCDs Code Status: Full code Family Communication: No family available at this time. Disposition Plan: Status is: Inpatient  Remains inpatient appropriate because:Inpatient level of care appropriate due to severity of illness   Dispo: The patient is from: Home              Anticipated d/c is to: SNF              Patient currently is not medically stable to d/c.   Difficult to place patient No  Level of care:  Level of care: Progressive   The medical decision making on this patient was of high complexity and the patient is at high risk for clinical deterioration, therefore this is a level 3 visit.  Shela Leff MD [facility]  If 7PM-7AM, please contact night-coverage [email or web address]  05/26/2020, 1:04 AM

## 2020-05-20 NOTE — ED Provider Notes (Signed)
[facility] EMERGENCY DEPARTMENT Provider Note   CSN: [id number] Arrival date & time: 05/23/2020  1128     History Chief Complaint  Patient presents with  . Hypotension    Jerry Wise is a 79 y.o. male-past medical history of hypertension, GERD, COPD, lung cancer status post right upper lobectomy in 2018, prostate cancer post reactive seed implants, pernicious anemia, chronic renal sufficiency that presents the emergency department today after visiting PCP office for leg infection.  Per EMS systolic was 76.  Patient is hard of hearing, difficult to obtain HPI.  Per EMS, states that he was on Levaquin for UTI about 2 weeks ago, patient has Foley catheter in.  Patient states that he is had this for about 5 years, last changed last week.  Patient went for checkup today with PCP, noted to have an infection with swelling on the right side.  Patient denies falling, is not anticoagulated.  Denies any fevers or chills.  Patient states that started Monday, denies any pain to the area.  Denies pain anywhere, no shortness of breath.  Patient is full code according to wife.  HPI     Past Medical History:  Diagnosis Date  . Asthma    as child  . Cancer ([place])    lung cancer  . Chronic renal insufficiency    NEPHROLOGIST-  DR Justin Mend -- LAST VISIT MARCH 2014  . Dyspnea   . GERD (gastroesophageal reflux disease)   . Gout   . Gouty arthritis    ALL JOINTS  . History of kidney stones   . History of prostate cancer    S/P RADIACTIVE SEED IMPLANTS  . Hydronephrosis, bilateral   . Hypertension   . 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 Pernicious anemia FOLLOWED BY DR WEBB   x3 feraheme injection --- last one 03-22-2012  . Urethral stricture     Patient Active Problem List   Diagnosis Date Noted  . Aortic atherosclerosis ([place]) 03/27/2020  . 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 COPD (chronic obstructive  pulmonary disease) ([place]) 03/07/2020  . Chronic cough 03/07/2020  . Lung nodule 03/21/2017  . S/P lobectomy of lung 03/03/2017  . 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 Tobacco use 01/26/2017  . Mediastinal lymphadenopathy 01/26/2017  . Prostate cancer ([place]) 01/26/2017  . Hypertension 01/26/2017  . Hyperparathyroidism, primary ([place]) 04/24/2012  . Multiple thyroid nodules 04/24/2012    Past Surgical History:  Procedure Laterality Date  . CLOSED REDUCTION CLAVICAL FX     NO HARDWARE  . COLONOSCOPY    . CYSTO/  BALLOON DILATION OF URETHRAL STRICTURE AND BLADDER BX WITH FULGERATION  09-14-2007  . CYSTOSCOPY W/ URETERAL STENT PLACEMENT N/A 04/06/2012   Procedure: CYSTOSCOPY Balloon DILATION OF STRICTURE, Fulgeration Bladder Neck, Cystogram;  Surgeon: Malka So, MD;  Location: [facility];  Service: Urology;  Laterality: N/A;  . DIRECT LARYNGOSCOPY N/A 03/03/2017   Procedure: DIRECT LARYNGOSCOPY WITH nasopharyngoscopy and BIOPSY;  Surgeon: Rozetta Nunnery, MD;  Location: [facility];  Service: ENT;  Laterality: N/A;  . IR THORACENTESIS ASP PLEURAL SPACE W/IMG GUIDE  06/19/2018  . KNEE SURGERY    . MEDIASTINOSCOPY N/A 03/03/2017   Procedure: MEDIASTINOSCOPY;  Surgeon: [name], MD;  Location: [facility] OR;  Service: Thoracic;  Laterality: N/A;  . MULTIPLE TOOTH EXTRACTIONS    . PARATHYROIDECTOMY Right 04/28/2012   Procedure:  RIGHT INFERIOR PARATHYROIDECTOMY;  Surgeon: Earnstine Regal, MD;  Location: [facility];  Service: General;  Laterality: Right;  . RADIOACTIVE PROSTATE SEED IMPLANTS  2000   prostate cancer  . VIDEO ASSISTED THORACOSCOPY (VATS)/ LOBECTOMY Right 03/03/2017   Procedure: VIDEO ASSISTED THORACOSCOPY (VATS)/RIGHT UPPER LOBECTOMY;  Surgeon: [name], MD;  Location: [facility];  Service: Thoracic;  Laterality: Right;  Marland Kitchen VIDEO ASSISTED THORACOSCOPY (VATS)/WEDGE RESECTION Right 03/21/2017   Procedure: REDO VIDEO ASSISTED  THORACOSCOPY (VATS)/WEDGE RESECTION;  Surgeon: [name], MD;  Location: [facility];  Service: Thoracic;  Laterality: Right;  Marland Kitchen VIDEO BRONCHOSCOPY WITH ENDOBRONCHIAL NAVIGATION N/A 02/02/2017   Procedure: VIDEO BRONCHOSCOPY WITH ENDOBRONCHIAL NAVIGATION  with biopsies  and Fiducial Placement;  Surgeon: Collene Gobble, MD;  Location: [facility];  Service: Thoracic;  Laterality: N/A;  . VIDEO BRONCHOSCOPY WITH ENDOBRONCHIAL ULTRASOUND N/A 02/02/2017   Procedure: VIDEO BRONCHOSCOPY WITH ENDOBRONCHIAL ULTRASOUND  with node biopsies;  Surgeon: Collene Gobble, MD;  Location: [facility];  Service: Thoracic;  Laterality: N/A;       No family history on file.  Social History   Tobacco Use  . Smoking status: Former Smoker    Packs/day: 0.50    Years: 55.00    Pack years: 27.50    Types: Cigarettes    Start date: 83    Quit date: 01/2017    Years since quitting: 3.3  . Smokeless tobacco: Never Used  Vaping Use  . Vaping Use: Never used  Substance Use Topics  . Alcohol use: Yes    Alcohol/week: 14.0 standard drinks    Types: 14 Standard drinks or equivalent per week    Comment: 3 or more SHOT DAILY  . Drug use: No    Home Medications Prior to Admission medications   Medication Sig Start Date End Date Taking? Authorizing Provider  acetaminophen (TYLENOL) 500 MG tablet Take 1,000 mg by mouth every 8 (eight) hours as needed for mild pain or headache.    [provider]  albuterol (VENTOLIN HFA) 108 (90 Base) MCG/ACT inhaler Inhale 2 puffs into the lungs every 6 (six) hours as needed for wheezing or shortness of breath. 04/08/20   Collene Gobble, MD  allopurinol (ZYLOPRIM) 300 MG tablet Take 300 mg by mouth daily.     [provider]  amLODipine (NORVASC) 10 MG tablet Take 10 mg by mouth daily.     [provider]  aspirin EC 81 MG tablet Take 81 mg by mouth daily.    [provider]  Cholecalciferol 2000 units CAPS Take 2,000 Units by mouth daily.     [provider]  hydrochlorothiazide (MICROZIDE) 12.5 MG capsule Take 12.5 mg by mouth daily.    [provider]  labetalol (NORMODYNE) 100 MG tablet Take 1 tablet (100 mg total) by mouth 2 (two) times daily. 03/27/20   Werner Lean, MD  PARoxetine (PAXIL) 10 MG tablet  12/12/19   [provider]  Tiotropium Bromide-Olodaterol (STIOLTO RESPIMAT) 2.5-2.5 MCG/ACT AERS Inhale 2 puffs into the lungs daily. 04/08/20   Collene Gobble, MD    Allergies    Shellfish-derived products  Review of Systems   Review of Systems  Constitutional: Negative for chills, diaphoresis, fatigue and fever.  HENT: Negative for congestion, sore throat and trouble swallowing.   Eyes: Negative for pain and visual disturbance.  Respiratory: Negative for cough, shortness of breath and wheezing.   Cardiovascular: Negative for chest pain, palpitations and leg swelling.  Gastrointestinal:  Negative for abdominal distention, abdominal pain, diarrhea, nausea and vomiting.  Genitourinary: Negative for difficulty urinating.  Musculoskeletal: Negative for back pain, neck pain and neck stiffness.  Skin: Negative for pallor.  Neurological: Negative for dizziness, speech difficulty, weakness and headaches.  Psychiatric/Behavioral: Negative for confusion.    Physical Exam Updated Vital Signs BP (!) 156/119   Pulse 95   Temp (!) 97.4 F (36.3 C) (Oral)   Resp (!) 31   Ht 6\' 2"  (1.88 m)   Wt 61.2 kg   SpO2 98%   BMI 17.33 kg/m   Physical Exam Constitutional:      General: He is in acute distress.     Appearance: Normal appearance. He is ill-appearing. He is not toxic-appearing or diaphoretic.  HENT:     Mouth/Throat:     Mouth: Mucous membranes are moist.     Pharynx: Oropharynx is clear.  Eyes:     General: No scleral icterus.    Extraocular Movements: Extraocular movements intact.     Pupils: Pupils are equal, round, and reactive to light.  Cardiovascular:     Rate and  Rhythm: Regular rhythm. Tachycardia present.     Pulses: Normal pulses.     Heart sounds: Normal heart sounds.  Pulmonary:     Effort: Pulmonary effort is normal. No respiratory distress.     Breath sounds: Normal breath sounds. No stridor. No wheezing, rhonchi or rales.  Chest:     Chest wall: No tenderness.  Abdominal:     General: Abdomen is flat. There is no distension.     Palpations: Abdomen is soft.     Tenderness: There is no abdominal tenderness. There is no guarding or rebound.  Genitourinary:    Comments: Foley in place Musculoskeletal:        General: No swelling or tenderness. Normal range of motion.     Cervical back: Normal range of motion and neck supple. No rigidity.     Right lower leg: Edema present.     Left lower leg: No edema.     Comments: Swelling noted from right hip down to right leg with tight compartments in thigh.  There is medial erythema and warmth to thigh in addition to medial side of knee. Unable to range knee, will minimally lift off leg from bed.  Tenderness to palpation of cellulitic area.  Pitting edema noted throughout the leg.  Warmth also noted to the medial aspect of leg where there is erythema. Leg is not blue or white. Doppler pulse 2+ PT   Normal left.   Skin:    General: Skin is warm and dry.     Capillary Refill: Capillary refill takes less than 2 seconds.     Coloration: Skin is not pale.  Neurological:     General: No focal deficit present.     Mental Status: He is alert and oriented to person, place, and time.  Psychiatric:        Mood and Affect: Mood normal.        Behavior: Behavior normal.         ED Results / Procedures / Treatments   Labs (all labs ordered are listed, but only abnormal results are displayed) Labs Reviewed  LACTIC ACID, PLASMA - Abnormal; Notable for the following components:      Result Value   Lactic Acid, Venous 6.6 (*)    All other components within normal limits  COMPREHENSIVE METABOLIC PANEL -  Abnormal; Notable for the following components:  Sodium 134 (*)    Chloride 96 (*)    CO2 17 (*)    BUN 87 (*)    Creatinine, Ser 2.37 (*)    Calcium 8.5 (*)    Total Protein 5.2 (*)    Albumin 1.4 (*)    AST 46 (*)    Total Bilirubin 2.4 (*)    GFR, Estimated 27 (*)    Anion gap 21 (*)    All other components within normal limits  CBC WITH DIFFERENTIAL/PLATELET - Abnormal; Notable for the following components:   WBC 61.9 (*)    RBC 3.14 (*)    Hemoglobin 8.8 (*)    HCT 25.4 (*)    Platelets 417 (*)    Neutro Abs 56.3 (*)    Lymphs Abs 0.6 (*)    Monocytes Absolute 2.4 (*)    Abs Immature Granulocytes 2.56 (*)    All other components within normal limits  PROTIME-INR - Abnormal; Notable for the following components:   Prothrombin Time 17.3 (*)    INR 1.4 (*)    All other components within normal limits  URINALYSIS, ROUTINE W REFLEX MICROSCOPIC - Abnormal; Notable for the following components:   Color, Urine AMBER (*)    APPearance TURBID (*)    Hgb urine dipstick MODERATE (*)    Protein, ur 100 (*)    Leukocytes,Ua LARGE (*)    WBC, UA >50 (*)    Bacteria, UA FEW (*)    Non Squamous Epithelial 0-5 (*)    All other components within normal limits  RESP PANEL BY RT-PCR (FLU A&B, COVID) ARPGX2  CULTURE, BLOOD (ROUTINE X 2)  CULTURE, BLOOD (ROUTINE X 2)  URINE CULTURE  APTT  LACTIC ACID, PLASMA    EKG None  Radiology DG Chest Port 1 View  Result Date: 05/12/2020 CLINICAL DATA:  Sepsis. EXAM: PORTABLE CHEST 1 VIEW COMPARISON:  June 27, 2018. FINDINGS: The heart size and mediastinal contours are within normal limits. No pneumothorax or pleural effusion is noted. Left lung is clear. Stable postsurgical changes and pleural thickening is seen involving the right lung. No definite acute abnormality is noted. The visualized skeletal structures are unremarkable. IMPRESSION: No active disease. Electronically Signed   By: Marijo Conception M.D.   On: 05/19/2020 12:16   VAS [place]  LOWER EXTREMITY VENOUS (DVT) (ONLY [facility] & [facility] 7a-7p)  Result Date: 05/17/2020  Lower Venous DVT Study Patient Name:  Jerry Wise  Date of Exam:   06/01/2020 Medical Rec #: [id number]      Accession #:    [id number] Date of Birth: 06/15/1940      Patient Gender: M Patient Age:   80Y Exam Location:  [facility] Procedure:      VAS [place] LOWER EXTREMITY VENOUS (DVT) Referring Phys: [id number] [facility] Keatyn Luck --------------------------------------------------------------------------------  Indications: Pain, Erythema, and Swelling.  Comparison Study: No prior studies. Performing Technologist: Darlin Coco RDMS,RVT  Examination Guidelines: A complete evaluation includes B-mode imaging, spectral Doppler, color Doppler, and power Doppler as needed of all accessible portions of each vessel. Bilateral testing is considered an integral part of a complete examination. Limited examinations for reoccurring indications may be performed as noted. The reflux portion of the exam is performed with the patient in reverse Trendelenburg.  +---------+---------------+---------+-----------+----------+--------------+ RIGHT    CompressibilityPhasicitySpontaneityPropertiesThrombus Aging +---------+---------------+---------+-----------+----------+--------------+ CFV      Full           Yes      Yes                                 +---------+---------------+---------+-----------+----------+--------------+  SFJ      Full                                                        +---------+---------------+---------+-----------+----------+--------------+ FV Prox  Full                                                        +---------+---------------+---------+-----------+----------+--------------+ FV Mid   Full                                                        +---------+---------------+---------+-----------+----------+--------------+ FV DistalFull                                                         +---------+---------------+---------+-----------+----------+--------------+ PFV      Full                                                        +---------+---------------+---------+-----------+----------+--------------+ POP      Full           Yes      Yes                                 +---------+---------------+---------+-----------+----------+--------------+ PTV      Full                                                        +---------+---------------+---------+-----------+----------+--------------+ PERO     Full                                                        +---------+---------------+---------+-----------+----------+--------------+   +----+---------------+---------+-----------+----------+--------------+ LEFTCompressibilityPhasicitySpontaneityPropertiesThrombus Aging +----+---------------+---------+-----------+----------+--------------+ CFV Full           Yes      Yes                                 +----+---------------+---------+-----------+----------+--------------+     Summary: RIGHT: - There is no evidence of deep vein thrombosis in the lower extremity.  - No cystic structure found in the popliteal fossa.  - Incidental: Large, irregular focal collection containing mixed echogenicities identified from the proximal to distal medial thigh.  LEFT: - No evidence of common  femoral vein obstruction.  *See table(s) above for measurements and observations.    Preliminary     Procedures .Critical Care Performed by: Alfredia Client, PA-C Authorized by: Alfredia Client, PA-C   Critical care provider statement:    Critical care time (minutes):  45   Critical care was necessary to treat or prevent imminent or life-threatening deterioration of the following conditions:  Sepsis and shock   Critical care was time spent personally by me on the following activities:  Discussions with consultants, evaluation of patient's response to treatment, examination of patient,  ordering and performing treatments and interventions, ordering and review of laboratory studies, ordering and review of radiographic studies, pulse oximetry, re-evaluation of patient's condition, obtaining history from patient or surrogate and review of old charts     Medications Ordered in ED Medications  lactated ringers infusion ( Intravenous New Bag/Given 05/10/2020 1159)  vancomycin (VANCOREADY) IVPB 1250 mg/250 mL (1,250 mg Intravenous New Bag/Given 05/09/2020 1320)  vancomycin (VANCOREADY) IVPB 1250 mg/250 mL (has no administration in time range)  ceFEPIme (MAXIPIME) 2 g in sodium chloride 0.9 % 100 mL IVPB (has no administration in time range)  lactated ringers bolus 1,000 mL (0 mLs Intravenous Stopped 06/03/2020 1306)    And  lactated ringers bolus 1,000 mL (0 mLs Intravenous Stopped 05/22/2020 1404)  ceFEPIme (MAXIPIME) 2 g in sodium chloride 0.9 % 100 mL IVPB (0 g Intravenous Stopped 05/28/2020 1319)    ED Course  I have reviewed the triage vital signs and the nursing notes.  Pertinent labs & imaging results that were available during my care of the patient were reviewed by me and considered in my medical decision making (see chart for details).    MDM Rules/Calculators/A&P                           Shields L Wilsey is a 80 y.o. male-past medical history of hypertension, GERD, COPD, lung cancer status post right upper lobectomy in 2018, prostate cancer post reactive seed implants, pernicious anemia, chronic renal sufficiency that presents the emergency department today after visiting PCP office for leg infection.  Patient appears chronically ill, leg appears infected with clear source and hypotension with a pulse of 97 and tachypnea of 26, patient meets septic criteria.  We will initiate sepsis order set, broad-spectrum antibiotics began.  Also was able to change out Foley catheter.  1237Discussed arthrocentesis of knee with Dr. Vanita Panda, we decided against this at this time.  Patient is having  cellulitis to [facility] real aspect of knee, any is not entirely red or warm.  Will be difficult to obtain fluid without going through cellulitis.  Will obtain rest of work-up first, BP now 96/54.  Patient is stable.  231Work-up today shows white count of 61 with lactic acid of 6.6. Pt also with AKI, Cr 2.47   Patient is already received 2700 cc of fluids, is currently receiving lactated Ringer infusion.  Patient has received vancomycin and cefepime, ultrasound of this area does not show DVT, however does show large heterogenous fluid collection suggesting abscess? Does not show large pocket of fluid in the knee itself, lower suspicion for septic joint.  Spoke to radiology who recommends MRI of the entire leg since patient does have GFR of 27 unable to obtain contrast at this time.  Patient is stable.  Pt care was handed off to L.Layden PA-C at hand off.  Complete history and physical and current plan have been communicated.  Please refer to their note for the remainder of ED care and ultimate disposition. Awaiting MRI studies, may need surgery if large abscess? pt will need to be admitted for sepsis with septic shock secondary to cellulitis from right leg and UTI in addition to AKI.  I discussed this case with my attending physician who cosigned this note including patient's presenting symptoms, physical exam, and planned diagnostics and interventions. Attending physician stated agreement with plan or made changes to plan which were implemented.   Attending physician assessed patient at bedside.   Final Clinical Impression(s) / ED Diagnoses Final diagnoses:  Sepsis with acute renal failure and septic shock, due to unspecified organism, unspecified acute renal failure type [facility])    Rx / [address] Orders ED Discharge Orders    None       Alfredia Client, PA-C 05/09/2020 1515    Carmin Muskrat, MD 05/19/2020 1315

## 2020-05-20 NOTE — Consult Note (Signed)
NAME:  Jerry Wise, MRN:  [id number], DOB:  [birth year], LOS: 0 ADMISSION DATE:  05/12/2020, CONSULTATION DATE:  05/12/2020 REFERRING MD:  EDP CHIEF COMPLAINT:   L leg infection  History of Present Illness:  Jerry Wise is a 80 y.o. M with PMH significant for COPD not on home O2, CKD, Gout, prostate Ca, RUL neoplasm under surveillance, HTN who started developing a swollen R knee approximately one week ago.   Denies any trauma, cuts or abrasions.  He developed worsening swelling and redness and was evaluated by PCP where SBP was in the 70's, so referred to the ED.  He denies any fevers or chills, but has had generalized malaise and poor appetite.  He was on Levaquin two weeks ago for UTI.     In the ED, blood pressure improved with IVF, he was given Vancomycin and Cefepime and labs revealed WBC of 61k, lactic acid 6.6, creatinine 2.3 (baseline 1.4-1.6), bedside US with large fluid collection.  ED provider spoke with ortho who opted not to perform arthrocentesis secondary to overlying cellulitis.  MRI of the knee ordered.  PCCM consulted for hypotension.  At the time of evaluation, pt awake and interactive with MAP of 66 after ~2.5L IVF.  Pertinent  Medical History    has a past medical history of Asthma, Cancer ([place]), Chronic renal insufficiency, Dyspnea, GERD (gastroesophageal reflux disease), Gout, Gouty arthritis, History of kidney stones, History of prostate cancer, Hydronephrosis, bilateral, Hypertension, Parathyroid abnormality ([place]), Pernicious anemia (FOLLOWED BY DR WEBB), and Urethral stricture.   Significant Hospital Events: Including procedures, antibiotic start and stop dates in addition to other pertinent events   . 5/17 Presented to ED, treated for sepsis with IVF, abx, BP improved.  PCCM consulted and recommend admission to the floor  Interim History / Subjective:  As above  Objective   Blood pressure 116/67, pulse 98, temperature (!) 97.4 F (36.3 C), temperature source Oral, resp.  rate (!) 28, height 6\' 2"  (1.88 m), weight 61.2 kg, SpO2 93 %.        Intake/Output Summary (Last 24 hours) at 05/06/2020 2013 Last data filed at 06/03/2020 1926 Gross per 24 hour  Intake 4349 ml  Output --  Net 4349 ml   Filed Weights   05/27/2020 1159  Weight: 61.2 kg   General:   VeryThin, elderly M resting in bed, awake and in no distress HEENT: MM pink/moist, sclera anicteric Neuro: awake, alert, hard of hearing but answers questions appropriately, moving all extremities CV: s1s2 tachycardic, regular, no m/r/g PULM:  Clear bilaterally without rhonchi or wheezing, on room air in no distress GI: soft, bsx4 active  Extremities: warm/dry, marked edema and erythema of the R knee, erythema and induration extending proximally to the medial thigh and edema to the R foot.  Tender to palpation with joint fluctuance and slight anterior crepitus  Skin: as above, + tenting   Labs/imaging that I havepersonally reviewed  (right click and "Reselect all SmartList Selections" daily)   CBC-WBC 61k Lactic acid BMP-AKI  Resolved Hospital Problem list   Hypotension  Assessment & Plan:   Sepsis likely secondary to cellulitis with septic arthritis or possible abscess with Hyperleukocytosis Ortho aware and to evaluate patient Received sepsis protocol in the ED P: -MRI of the RLE ordered, pt was moving too much to tolerate earlier.  If unable to obtain this evening would get plain films to evaluate for subcutaneous gas  -Defer arthrocentesis to Ortho -continue broad spectrum antibiotics and follow cultures -  He is no longer hypotensive after approximately 2.5L IVF, awake and alert and on RA, he is stable for admission to the floor, if his septic shock were to worsen we would be glad to transfer to ICU.  He appears volume down on exam, suspect would tolerate additional IVF if becomes hypotensive again  Thank you for this consult, we will sign off, but please re-engage PCCM for any concerns or  critical care needs.       Best practice   Per primary Full Code  Labs   CBC: Recent Labs  Lab 05/22/2020 1135  WBC 61.9*  NEUTROABS 56.3*  HGB 8.8*  HCT 25.4*  MCV 80.9  PLT 417*    Basic Metabolic Panel: Recent Labs  Lab 05/27/2020 1135  NA 134*  K 4.4  CL 96*  CO2 17*  GLUCOSE 73  BUN 87*  CREATININE 2.37*  CALCIUM 8.5*   GFR: Estimated Creatinine Clearance: 21.5 mL/min (A) (by C-G formula based on SCr of 2.37 mg/dL (H)). Recent Labs  Lab 05/18/2020 1135 05/12/2020 1422 05/31/2020 1836  WBC 61.9*  --   --   LATICACIDVEN 6.6* 6.6* 4.7*    Liver Function Tests: Recent Labs  Lab 05/09/2020 1135  AST 46*  ALT 12  ALKPHOS 117  BILITOT 2.4*  PROT 5.2*  ALBUMIN 1.4*   No results for input(s): LIPASE, AMYLASE in the last 168 hours. No results for input(s): AMMONIA in the last 168 hours.  ABG    Component Value Date/Time   PHART 7.391 03/22/2017 0656   PCO2ART 30.6 (L) 03/22/2017 0656   PO2ART 77.0 (L) 03/22/2017 0656   HCO3 18.6 (L) 03/22/2017 0656   TCO2 20 (L) 03/22/2017 0656   ACIDBASEDEF 6.0 (H) 03/22/2017 0656   O2SAT 96.0 03/22/2017 0656     Coagulation Profile: Recent Labs  Lab 06/03/2020 1135  INR 1.4*    Cardiac Enzymes: No results for input(s): CKTOTAL, CKMB, CKMBINDEX, TROPONINI in the last 168 hours.  HbA1C: No results found for: HGBA1C  CBG: No results for input(s): GLUCAP in the last 168 hours.  Review of Systems:   Review of Systems  Constitutional: Positive for diaphoresis and malaise/fatigue. Negative for chills and fever.  Cardiovascular: Negative.   Gastrointestinal: Negative.      Past Medical History:  He,  has a past medical history of Asthma, Cancer ([place]), Chronic renal insufficiency, Dyspnea, GERD (gastroesophageal reflux disease), Gout, Gouty arthritis, History of kidney stones, History of prostate cancer, Hydronephrosis, bilateral, Hypertension, Parathyroid abnormality ([place]), Pernicious anemia (FOLLOWED BY DR  WEBB), and Urethral stricture.   Surgical History:   Past Surgical History:  Procedure Laterality Date  . CLOSED REDUCTION CLAVICAL FX     NO HARDWARE  . COLONOSCOPY    . CYSTO/  BALLOON DILATION OF URETHRAL STRICTURE AND BLADDER BX WITH FULGERATION  09-14-2007  . CYSTOSCOPY W/ URETERAL STENT PLACEMENT N/A 04/06/2012   Procedure: CYSTOSCOPY Balloon DILATION OF STRICTURE, Fulgeration Bladder Neck, Cystogram;  Surgeon: Malka So, MD;  Location[facility];  Service: Urology;  Laterality: N/A;  . DIRECT LARYNGOSCOPY N/A 03/03/2017   Procedure: DIRECT LARYNGOSCOPY WITH nasopharyngoscopy and BIOPSY;  Surgeon: Rozetta Nunnery, MD;  Location: [facility];  Service: ENT;  Laterality: N/A;  . IR THORACENTESIS ASP PLEURAL SPACE W/IMG GUIDE  06/19/2018  . KNEE SURGERY    . MEDIASTINOSCOPY N/A 03/03/2017   Procedure: MEDIASTINOSCOPY;  Surgeon: [name], MD;  Location: [facility];  Service: Thoracic;  Laterality: N/A;  .  MULTIPLE TOOTH EXTRACTIONS    . PARATHYROIDECTOMY Right 04/28/2012   Procedure:  RIGHT INFERIOR PARATHYROIDECTOMY;  Surgeon: Earnstine Regal, MD;  Location: [facility];  Service: General;  Laterality: Right;  . RADIOACTIVE PROSTATE SEED IMPLANTS  2000   prostate cancer  . VIDEO ASSISTED THORACOSCOPY (VATS)/ LOBECTOMY Right 03/03/2017   Procedure: VIDEO ASSISTED THORACOSCOPY (VATS)/RIGHT UPPER LOBECTOMY;  Surgeon: [name], MD;  Location: [facility];  Service: Thoracic;  Laterality: Right;  Marland Kitchen VIDEO ASSISTED THORACOSCOPY (VATS)/WEDGE RESECTION Right 03/21/2017   Procedure: REDO VIDEO ASSISTED THORACOSCOPY (VATS)/WEDGE RESECTION;  Surgeon: [name], MD;  Location: [facility];  Service: Thoracic;  Laterality: Right;  Marland Kitchen VIDEO BRONCHOSCOPY WITH ENDOBRONCHIAL NAVIGATION N/A 02/02/2017   Procedure: VIDEO BRONCHOSCOPY WITH ENDOBRONCHIAL NAVIGATION  with biopsies  and Fiducial Placement;  Surgeon: Collene Gobble, MD;  Location: [facility];  Service: Thoracic;   Laterality: N/A;  . VIDEO BRONCHOSCOPY WITH ENDOBRONCHIAL ULTRASOUND N/A 02/02/2017   Procedure: VIDEO BRONCHOSCOPY WITH ENDOBRONCHIAL ULTRASOUND  with node biopsies;  Surgeon: Collene Gobble, MD;  Location: [facility];  Service: Thoracic;  Laterality: N/A;     Social History:   reports that he quit smoking about 3 years ago. His smoking use included cigarettes. He started smoking about 66 years ago. He has a 27.50 pack-year smoking history. He has never used smokeless tobacco. He reports current alcohol use of about 14.0 standard drinks of alcohol per week. He reports that he does not use drugs.   Family History:  His family history is not on file.   Allergies Allergies  Allergen Reactions  . Shellfish-Derived Products Other (See Comments)     Home Medications  Prior to Admission medications   Medication Sig Start Date End Date Taking? Authorizing Provider  acetaminophen (TYLENOL) 500 MG tablet Take 1,000 mg by mouth every 8 (eight) hours as needed for mild pain or headache.    [provider]  albuterol (VENTOLIN HFA) 108 (90 Base) MCG/ACT inhaler Inhale 2 puffs into the lungs every 6 (six) hours as needed for wheezing or shortness of breath. 04/08/20   Collene Gobble, MD  allopurinol (ZYLOPRIM) 300 MG tablet Take 300 mg by mouth daily.     [provider]  amLODipine (NORVASC) 10 MG tablet Take 10 mg by mouth daily.     [provider]  aspirin EC 81 MG tablet Take 81 mg by mouth daily.    [provider]  Cholecalciferol 2000 units CAPS Take 2,000 Units by mouth daily.    [provider]  hydrochlorothiazide (MICROZIDE) 12.5 MG capsule Take 12.5 mg by mouth daily.    [provider]  labetalol (NORMODYNE) 100 MG tablet Take 1 tablet (100 mg total) by mouth 2 (two) times daily. 03/27/20   Werner Lean, MD  PARoxetine (PAXIL) 10 MG tablet  12/12/19   [provider]  Tiotropium Bromide-Olodaterol (STIOLTO RESPIMAT)  2.5-2.5 MCG/ACT AERS Inhale 2 puffs into the lungs daily. 04/08/20   Collene Gobble, MD     Critical care time: 45 minutes    CRITICAL CARE Performed by: Otilio Carpen Kevionna Heffler   Total critical care time: 45 minutes  Critical care time was exclusive of separately billable procedures and treating other patients.  Critical care was necessary to treat or prevent imminent or life-threatening deterioration.  Critical care was time spent personally by me on the following activities: development of treatment plan with patient and/or surrogate as well as nursing, discussions with consultants, evaluation  of patient's response to treatment, examination of patient, obtaining history from patient or surrogate, ordering and performing treatments and interventions, ordering and review of laboratory studies, ordering and review of radiographic studies, pulse oximetry and re-evaluation of patient's condition.    Otilio Carpen Granville Whitefield, PA-C [facility] See Amion for pager If no response to pager , please call [phone] until 7pm After 7:00 pm call Elink  [phone]?[place]

## 2020-05-20 NOTE — Sepsis Progress Note (Signed)
Code Sepsis tracked by [facility].

## 2020-05-20 NOTE — Sepsis Progress Note (Signed)
Notified provider of need to order fluid bolus and a repeat Lactate.

## 2020-05-20 NOTE — ED Triage Notes (Signed)
PT BIB EMS due to PCP calling EMS while pt was at appointment. PCP stated pt presented septic. Pt has an infection in his left leg and is very swollen on the right side. Pt has a foley cath. EMS states BP was 70 systolic and Temp was 29.5 orally. Pt received 700 cc of fluid via EMS. Pt is axox4.

## 2020-05-20 NOTE — ED Notes (Signed)
PA notified of WBC.

## 2020-05-20 NOTE — ED Provider Notes (Signed)
11:28 PM: I was contacted by radiology regarding his CT scan.  CT scan shows extensive gas collections within the medial and lateral vastus muscles of the distal thigh concerning for necrotizing infection.  There is moderate to large knee effusion containing gas bubbles or any concern for joint space involvement of infection.  There is generalized edema and cellulitis about the knee with 5.6 cm mildly rim-enhancing gas and fluid collection along the medial aspect of the knee concerning for soft tissue abscess.  Discussed patient with Dr. Marlowe Sax (hospitalist) regarding CT findings.  She asked that I touch base with surgeon for further evaluation.  Discussed patient with Dr. Thermon Leyland (General surgery). He recommends me consulting ortho regarding these findings.   11:36 PM: Discussed with Dr. Stann Mainland (Ortho) regarding CT knee findings. He recommends keeping NPO with plans for consultation in the morning.   Secure chat Dr. Marlowe Sax regarding ortho recommendations.   Portions of this note were generated with Lobbyist. Dictation errors may occur despite best attempts at proofreading.      Volanda Napoleon, PA-C 05/25/2020 Broughton, April, MD 05/12/2020 2328

## 2020-05-20 NOTE — ED Notes (Signed)
PA notified of lactic 6.6.  ([place])

## 2020-05-20 NOTE — ED Notes (Signed)
Patient transported to MRI 

## 2020-05-20 NOTE — ED Notes (Signed)
PT transported to MRI. Janett Billow, RN 2C will pick up pt from MRI when scan is over.

## 2020-05-20 NOTE — ED Provider Notes (Signed)
Care assumed from  [facility], PA-C at shift change with MRI pending .  In brief, this patient is a 80 y.o. M with MRI pending.  Patient sent over by PCP for evaluation of redness, swelling to the right lower leg.  On initial ED arrival, patient was afebrile but was tachypneic, hypertensive.  Patient has redness and swelling noted from the medial aspect of the proximal thigh extending distally.  Please see note from previous provider for full history/physical exam.     Physical Exam  BP (!) 112/48   Pulse (!) 130   Temp (!) 97.4 F (36.3 C) (Oral)   Resp 20   Ht 6\' 2"  (1.88 m)   Wt 61.2 kg   SpO2 (!) 84%   BMI 17.33 kg/m   Physical Exam           ED Course/Procedures     Procedures   Results for orders placed or performed during the hospital encounter of 05/15/2020 (from the past 24 hour(s))  Lactic acid, plasma     Status: Abnormal   Collection Time: 05/10/2020 11:35 AM  Result Value Ref Range   Lactic Acid, Venous 6.6 (HH) 0.5 - 1.9 mmol/L  Comprehensive metabolic panel     Status: Abnormal   Collection Time: 05/18/2020 11:35 AM  Result Value Ref Range   Sodium 134 (L) 135 - 145 mmol/L   Potassium 4.4 3.5 - 5.1 mmol/L   Chloride 96 (L) 98 - 111 mmol/L   CO2 17 (L) 22 - 32 mmol/L   Glucose, Bld 73 70 - 99 mg/dL   BUN 87 (H) 8 - 23 mg/dL   Creatinine, Ser 2.37 (H) 0.61 - 1.24 mg/dL   Calcium 8.5 (L) 8.9 - 10.3 mg/dL   Total Protein 5.2 (L) 6.5 - 8.1 g/dL   Albumin 1.4 (L) 3.5 - 5.0 g/dL   AST 46 (H) 15 - 41 U/L   ALT 12 0 - 44 U/L   Alkaline Phosphatase 117 38 - 126 U/L   Total Bilirubin 2.4 (H) 0.3 - 1.2 mg/dL   GFR, Estimated 27 (L) >60 mL/min   Anion gap 21 (H) 5 - 15  CBC WITH DIFFERENTIAL     Status: Abnormal   Collection Time: 05/18/2020 11:35 AM  Result Value Ref Range   WBC 61.9 (HH) 4.0 - 10.5 K/uL   RBC 3.14 (L) 4.22 - 5.81 MIL/uL   Hemoglobin 8.8 (L) 13.0 - 17.0 g/dL   HCT 25.4 (L) 39.0 - 52.0 %   MCV 80.9 80.0 - 100.0 fL   MCH 28.0 26.0 - 34.0  pg   MCHC 34.6 30.0 - 36.0 g/dL   RDW 15.0 11.5 - 15.5 %   Platelets 417 (H) 150 - 400 K/uL   nRBC 0.0 0.0 - 0.2 %   Neutrophils Relative % 91 %   Neutro Abs 56.3 (H) 1.7 - 7.7 K/uL   Lymphocytes Relative 1 %   Lymphs Abs 0.6 (L) 0.7 - 4.0 K/uL   Monocytes Relative 4 %   Monocytes Absolute 2.4 (H) 0.1 - 1.0 K/uL   Eosinophils Relative 0 %   Eosinophils Absolute 0.0 0.0 - 0.5 K/uL   Basophils Relative 0 %   Basophils Absolute 0.0 0.0 - 0.1 K/uL   Immature Granulocytes 4 %   Abs Immature Granulocytes 2.56 (H) 0.00 - 0.07 K/uL  Protime-INR     Status: Abnormal   Collection Time: 05/26/2020 11:35 AM  Result Value Ref Range   Prothrombin Time  17.3 (H) 11.4 - 15.2 seconds   INR 1.4 (H) 0.8 - 1.2  APTT     Status: None   Collection Time: 05/11/2020 11:35 AM  Result Value Ref Range   aPTT 36 24 - 36 seconds  Resp Panel by RT-PCR (Flu A&B, Covid) Nasopharyngeal Swab     Status: None   Collection Time: 05/22/2020 12:00 PM   Specimen: Nasopharyngeal Swab; Nasopharyngeal(NP) swabs in vial transport medium  Result Value Ref Range   SARS Coronavirus 2 by RT PCR NEGATIVE NEGATIVE   Influenza A by PCR NEGATIVE NEGATIVE   Influenza B by PCR NEGATIVE NEGATIVE  Urinalysis, Routine w reflex microscopic Urine, Catheterized     Status: Abnormal   Collection Time: 05/27/2020  1:45 PM  Result Value Ref Range   Color, Urine AMBER (A) YELLOW   APPearance TURBID (A) CLEAR   Specific Gravity, Urine 1.016 1.005 - 1.030   pH 5.0 5.0 - 8.0   Glucose, UA NEGATIVE NEGATIVE mg/dL   Hgb urine dipstick MODERATE (A) NEGATIVE   Bilirubin Urine NEGATIVE NEGATIVE   Ketones, ur NEGATIVE NEGATIVE mg/dL   Protein, ur 100 (A) NEGATIVE mg/dL   Nitrite NEGATIVE NEGATIVE   Leukocytes,Ua LARGE (A) NEGATIVE   RBC / HPF 11-20 0 - 5 RBC/hpf   WBC, UA >50 (H) 0 - 5 WBC/hpf   Bacteria, UA FEW (A) NONE SEEN   Squamous Epithelial / LPF 11-20 0 - 5   WBC Clumps PRESENT    Mucus PRESENT    Hyaline Casts, UA PRESENT    Non  Squamous Epithelial 0-5 (A) NONE SEEN  Lactic acid, plasma     Status: Abnormal   Collection Time: 05/13/2020  2:22 PM  Result Value Ref Range   Lactic Acid, Venous 6.6 (HH) 0.5 - 1.9 mmol/L    MDM    PLAN:  Patient septic.  Unclear etiology at this time.  He does have a UTI but does have cellulitis of right lower extremity.  Ultrasound showed questionable fluid collection that could be abscess.  Given patient's kidney function, cannot obtain a CT scan so patient will go to MRI.  MDM: I was notified by sepsis coordinator that patient's lactic was still high.  Patient given an additional liter of fluid.  She requested a repeat lactic drawn but patient is currently in MRI.  RN informed me that patient was sent back from MRI because he was having difficulty sitting still.  MRI was unable to be completed.  Discussed patient with Dr. Stann Mainland (Ortho).  He agrees with plan for admission, broad-spectrum antibiotics.  He would like the MRI to attempt to be completed.  If patient cannot tolerate MRI he recommends obtaining a CT scan. He will plan to see patient in consultation tomorrow.  Discussed patient with Eddie Dibbles ([place]). Will plan to see patient to see if patient will be admitted to the ICU.  Discussed with critical care PA.  They feel that patient's blood pressure has improved after fluid bolus.  They feel like patient is appropriate for stepdown.  Discussed with Dr. Marlowe Sax for (hospitalist) who accpets patient for admission. She requests that we obtain a CT. will obtain CT without contrast given iodine shortage.   1. Sepsis with acute renal failure and septic shock, due to unspecified organism, unspecified acute renal failure type [facility])     Portions of this note were generated with Dragon dictation software. Dictation errors may occur despite best attempts at proofreading.  Volanda Napoleon, PA-C 06/03/2020 2101    Carmin Muskrat, MD 05/30/2020 1314

## 2020-05-20 NOTE — Progress Notes (Signed)
Lower extremity venous RT study completed.  Preliminary results relayed to [place], [place].   See CV Proc for preliminary results report.   Darlin Coco, RDMS, RVT

## 2020-05-21 ENCOUNTER — Encounter (HOSPITAL_COMMUNITY): Payer: Self-pay | Admitting: Internal Medicine

## 2020-05-21 ENCOUNTER — Inpatient Hospital Stay (HOSPITAL_COMMUNITY): Payer: Medicare HMO

## 2020-05-21 ENCOUNTER — Inpatient Hospital Stay (HOSPITAL_COMMUNITY): Payer: Medicare HMO | Admitting: Certified Registered"

## 2020-05-21 ENCOUNTER — Encounter (HOSPITAL_COMMUNITY): Admission: EM | Disposition: E | Payer: Self-pay | Source: Home / Self Care | Attending: Pulmonary Disease

## 2020-05-21 DIAGNOSIS — E872 Acidosis, unspecified: Secondary | ICD-10-CM

## 2020-05-21 DIAGNOSIS — M7989 Other specified soft tissue disorders: Secondary | ICD-10-CM | POA: Diagnosis not present

## 2020-05-21 DIAGNOSIS — Z9911 Dependence on respirator [ventilator] status: Secondary | ICD-10-CM

## 2020-05-21 DIAGNOSIS — N390 Urinary tract infection, site not specified: Secondary | ICD-10-CM

## 2020-05-21 DIAGNOSIS — A419 Sepsis, unspecified organism: Secondary | ICD-10-CM | POA: Diagnosis not present

## 2020-05-21 DIAGNOSIS — R6521 Severe sepsis with septic shock: Secondary | ICD-10-CM

## 2020-05-21 DIAGNOSIS — E43 Unspecified severe protein-calorie malnutrition: Secondary | ICD-10-CM | POA: Insufficient documentation

## 2020-05-21 DIAGNOSIS — M726 Necrotizing fasciitis: Secondary | ICD-10-CM

## 2020-05-21 DIAGNOSIS — J9601 Acute respiratory failure with hypoxia: Secondary | ICD-10-CM | POA: Diagnosis not present

## 2020-05-21 DIAGNOSIS — N179 Acute kidney failure, unspecified: Secondary | ICD-10-CM

## 2020-05-21 HISTORY — PX: I & D EXTREMITY: SHX5045

## 2020-05-21 LAB — POCT I-STAT 7, (LYTES, BLD GAS, ICA,H+H)
Acid-base deficit: 13 mmol/L — ABNORMAL HIGH (ref 0.0–2.0)
Acid-base deficit: 6 mmol/L — ABNORMAL HIGH (ref 0.0–2.0)
Acid-base deficit: 2 mmol/L (ref 0.0–2.0)
Bicarbonate: 14.6 mmol/L — ABNORMAL LOW (ref 20.0–28.0)
Bicarbonate: 17.4 mmol/L — ABNORMAL LOW (ref 20.0–28.0)
Bicarbonate: 20 mmol/L (ref 20.0–28.0)
Calcium, Ion: 0.97 mmol/L — ABNORMAL LOW (ref 1.15–1.40)
Calcium, Ion: 0.89 mmol/L — CL (ref 1.15–1.40)
Calcium, Ion: 0.87 mmol/L — CL (ref 1.15–1.40)
HCT: 17 % — ABNORMAL LOW (ref 39.0–52.0)
HCT: 20 % — ABNORMAL LOW (ref 39.0–52.0)
HCT: 29 % — ABNORMAL LOW (ref 39.0–52.0)
Hemoglobin: 5.8 g/dL — CL (ref 13.0–17.0)
Hemoglobin: 6.8 g/dL — CL (ref 13.0–17.0)
Hemoglobin: 9.9 g/dL — ABNORMAL LOW (ref 13.0–17.0)
O2 Saturation: 100 %
O2 Saturation: 100 %
O2 Saturation: 100 %
Patient temperature: 36.2
Potassium: 5.4 mmol/L — ABNORMAL HIGH (ref 3.5–5.1)
Potassium: 4.5 mmol/L (ref 3.5–5.1)
Potassium: 3.8 mmol/L (ref 3.5–5.1)
Sodium: 136 mmol/L (ref 135–145)
Sodium: 137 mmol/L (ref 135–145)
Sodium: 138 mmol/L (ref 135–145)
TCO2: 16 mmol/L — ABNORMAL LOW (ref 22–32)
TCO2: 18 mmol/L — ABNORMAL LOW (ref 22–32)
TCO2: 21 mmol/L — ABNORMAL LOW (ref 22–32)
pCO2 arterial: 43 mmHg (ref 32.0–48.0)
pCO2 arterial: 27 mmHg — ABNORMAL LOW (ref 32.0–48.0)
pCO2 arterial: 24.3 mmHg — ABNORMAL LOW (ref 32.0–48.0)
pH, Arterial: 7.137 — CL (ref 7.350–7.450)
pH, Arterial: 7.416 (ref 7.350–7.450)
pH, Arterial: 7.521 — ABNORMAL HIGH (ref 7.350–7.450)
pO2, Arterial: 276 mmHg — ABNORMAL HIGH (ref 83.0–108.0)
pO2, Arterial: 508 mmHg — ABNORMAL HIGH (ref 83.0–108.0)
pO2, Arterial: 522 mmHg — ABNORMAL HIGH (ref 83.0–108.0)

## 2020-05-21 LAB — CBC
HCT: 23.3 % — ABNORMAL LOW (ref 39.0–52.0)
HCT: 28.9 % — ABNORMAL LOW (ref 39.0–52.0)
Hemoglobin: 8.2 g/dL — ABNORMAL LOW (ref 13.0–17.0)
Hemoglobin: 10.2 g/dL — ABNORMAL LOW (ref 13.0–17.0)
MCH: 28.1 pg (ref 26.0–34.0)
MCH: 28.7 pg (ref 26.0–34.0)
MCHC: 35.2 g/dL (ref 30.0–36.0)
MCHC: 35.3 g/dL (ref 30.0–36.0)
MCV: 79.8 fL — ABNORMAL LOW (ref 80.0–100.0)
MCV: 81.2 fL (ref 80.0–100.0)
Platelets: 359 10*3/uL (ref 150–400)
Platelets: 147 10*3/uL — ABNORMAL LOW (ref 150–400)
RBC: 2.92 MIL/uL — ABNORMAL LOW (ref 4.22–5.81)
RBC: 3.56 MIL/uL — ABNORMAL LOW (ref 4.22–5.81)
RDW: 14.6 % (ref 11.5–15.5)
RDW: 15.8 % — ABNORMAL HIGH (ref 11.5–15.5)
WBC: 43.4 10*3/uL — ABNORMAL HIGH (ref 4.0–10.5)
WBC: 21.2 10*3/uL — ABNORMAL HIGH (ref 4.0–10.5)
nRBC: 0 % (ref 0.0–0.2)
nRBC: 0 % (ref 0.0–0.2)

## 2020-05-21 LAB — COMPREHENSIVE METABOLIC PANEL
ALT: 16 U/L (ref 0–44)
ALT: 17 U/L (ref 0–44)
AST: 54 U/L — ABNORMAL HIGH (ref 15–41)
AST: 90 U/L — ABNORMAL HIGH (ref 15–41)
Albumin: 1.1 g/dL — ABNORMAL LOW (ref 3.5–5.0)
Albumin: 1.1 g/dL — ABNORMAL LOW (ref 3.5–5.0)
Alkaline Phosphatase: 80 U/L (ref 38–126)
Alkaline Phosphatase: 42 U/L (ref 38–126)
Anion gap: 15 (ref 5–15)
Anion gap: 18 — ABNORMAL HIGH (ref 5–15)
BUN: 89 mg/dL — ABNORMAL HIGH (ref 8–23)
BUN: 90 mg/dL — ABNORMAL HIGH (ref 8–23)
CO2: 18 mmol/L — ABNORMAL LOW (ref 22–32)
CO2: 13 mmol/L — ABNORMAL LOW (ref 22–32)
Calcium: 8.1 mg/dL — ABNORMAL LOW (ref 8.9–10.3)
Calcium: 6.7 mg/dL — ABNORMAL LOW (ref 8.9–10.3)
Chloride: 102 mmol/L (ref 98–111)
Chloride: 104 mmol/L (ref 98–111)
Creatinine, Ser: 2.05 mg/dL — ABNORMAL HIGH (ref 0.61–1.24)
Creatinine, Ser: 2.13 mg/dL — ABNORMAL HIGH (ref 0.61–1.24)
GFR, Estimated: 32 mL/min — ABNORMAL LOW (ref >60)
GFR, Estimated: 31 mL/min — ABNORMAL LOW (ref >60)
Glucose, Bld: 68 mg/dL — ABNORMAL LOW (ref 70–99)
Glucose, Bld: 111 mg/dL — ABNORMAL HIGH (ref 70–99)
Potassium: 3.7 mmol/L (ref 3.5–5.1)
Potassium: 4.7 mmol/L (ref 3.5–5.1)
Sodium: 135 mmol/L (ref 135–145)
Sodium: 135 mmol/L (ref 135–145)
Total Bilirubin: 2.7 mg/dL — ABNORMAL HIGH (ref 0.3–1.2)
Total Bilirubin: 2.8 mg/dL — ABNORMAL HIGH (ref 0.3–1.2)
Total Protein: 4.3 g/dL — ABNORMAL LOW (ref 6.5–8.1)
Total Protein: 3.1 g/dL — ABNORMAL LOW (ref 6.5–8.1)

## 2020-05-21 LAB — MRSA PCR SCREENING
MRSA by PCR: NEGATIVE
MRSA by PCR: NEGATIVE

## 2020-05-21 LAB — URINE CULTURE: Culture: NO GROWTH

## 2020-05-21 LAB — POCT I-STAT, CHEM 8
BUN: 96 mg/dL — ABNORMAL HIGH (ref 8–23)
Calcium, Ion: 1.1 mmol/L — ABNORMAL LOW (ref 1.15–1.40)
Chloride: 104 mmol/L (ref 98–111)
Creatinine, Ser: 2 mg/dL — ABNORMAL HIGH (ref 0.61–1.24)
Glucose, Bld: 68 mg/dL — ABNORMAL LOW (ref 70–99)
HCT: 26 % — ABNORMAL LOW (ref 39.0–52.0)
Hemoglobin: 8.8 g/dL — ABNORMAL LOW (ref 13.0–17.0)
Potassium: 4.4 mmol/L (ref 3.5–5.1)
Sodium: 135 mmol/L (ref 135–145)
TCO2: 19 mmol/L — ABNORMAL LOW (ref 22–32)

## 2020-05-21 LAB — SYNOVIAL CELL COUNT + DIFF, W/ CRYSTALS
Eosinophils-Synovial: 0 % (ref 0–1)
Lymphocytes-Synovial Fld: 1 % (ref 0–20)
Monocyte-Macrophage-Synovial Fluid: 1 % — ABNORMAL LOW (ref 50–90)
Neutrophil, Synovial: 98 % — ABNORMAL HIGH (ref 0–25)
WBC, Synovial: 95000 /mm3 — ABNORMAL HIGH (ref 0–200)

## 2020-05-21 LAB — GLUCOSE, CAPILLARY
Glucose-Capillary: 48 mg/dL — ABNORMAL LOW (ref 70–99)
Glucose-Capillary: 123 mg/dL — ABNORMAL HIGH (ref 70–99)

## 2020-05-21 LAB — PREPARE RBC (CROSSMATCH)

## 2020-05-21 LAB — LACTIC ACID, PLASMA: Lactic Acid, Venous: 3.9 mmol/L (ref 0.5–1.9)

## 2020-05-21 SURGERY — IRRIGATION AND DEBRIDEMENT EXTREMITY
Anesthesia: General | Laterality: Right

## 2020-05-21 MED ORDER — SODIUM CHLORIDE 0.9 % IV SOLN
10.0000 mL/h | Freq: Once | INTRAVENOUS | Status: AC
  Administered 2020-05-21: 10 mL/h via INTRAVENOUS

## 2020-05-21 MED ORDER — IPRATROPIUM-ALBUTEROL 0.5-2.5 (3) MG/3ML IN SOLN
3.0000 mL | RESPIRATORY_TRACT | Status: DC
  Administered 2020-05-21 – 2020-05-23 (×10): 3 mL via RESPIRATORY_TRACT
  Filled 2020-05-21 (×10): qty 3

## 2020-05-21 MED ORDER — ALBUTEROL SULFATE HFA 108 (90 BASE) MCG/ACT IN AERS
2.0000 | INHALATION_SPRAY | Freq: Four times a day (QID) | RESPIRATORY_TRACT | Status: DC | PRN
  Filled 2020-05-21: qty 6.7

## 2020-05-21 MED ORDER — VASOPRESSIN 20 UNIT/ML IV SOLN
INTRAVENOUS | Status: AC
  Filled 2020-05-21: qty 1

## 2020-05-21 MED ORDER — ACETAMINOPHEN 650 MG RE SUPP: 650.0000 mg | Freq: Four times a day (QID) | RECTAL | Status: DC | PRN

## 2020-05-21 MED ORDER — LINEZOLID 600 MG/300ML IV SOLN
600.0000 mg | Freq: Two times a day (BID) | INTRAVENOUS | Status: DC
  Administered 2020-05-21 – 2020-05-23 (×4): 600 mg via INTRAVENOUS
  Filled 2020-05-21 (×6): qty 300

## 2020-05-21 MED ORDER — CHLORHEXIDINE GLUCONATE CLOTH 2 % EX PADS
6.0000 | MEDICATED_PAD | Freq: Once | CUTANEOUS | Status: AC
  Administered 2020-05-21: 6 via TOPICAL

## 2020-05-21 MED ORDER — PIPERACILLIN-TAZOBACTAM 3.375 G IVPB
3.3750 g | Freq: Three times a day (TID) | INTRAVENOUS | Status: DC
  Administered 2020-05-21 – 2020-05-23 (×8): 3.375 g via INTRAVENOUS
  Filled 2020-05-21 (×9): qty 50

## 2020-05-21 MED ORDER — NOREPINEPHRINE 4 MG/250ML-% IV SOLN
0.0000 ug/min | INTRAVENOUS | Status: DC
Start: 2020-05-21 — End: 2020-05-23
  Administered 2020-05-21 (×2): 5 ug/min via INTRAVENOUS
  Administered 2020-05-22 (×2): 7 ug/min via INTRAVENOUS
  Administered 2020-05-23: 6 ug/min via INTRAVENOUS
  Filled 2020-05-21 (×4): qty 250

## 2020-05-21 MED ORDER — ALBUMIN HUMAN 5 % IV SOLN: INTRAVENOUS | Status: DC | PRN

## 2020-05-21 MED ORDER — PHENYLEPHRINE HCL (PRESSORS) 10 MG/ML IV SOLN
INTRAVENOUS | Status: AC
  Filled 2020-05-21: qty 1

## 2020-05-21 MED ORDER — FENTANYL CITRATE (PF) 100 MCG/2ML IJ SOLN: 25.0000 ug | Freq: Once | INTRAMUSCULAR | Status: DC

## 2020-05-21 MED ORDER — SODIUM CHLORIDE 0.9 % IV SOLN
INTRAVENOUS | Status: DC | PRN
  Administered 2020-05-21: 250 mL via INTRAVENOUS

## 2020-05-21 MED ORDER — FENTANYL 2500MCG IN NS 250ML (10MCG/ML) PREMIX INFUSION
25.0000 ug/h | INTRAVENOUS | Status: DC
  Administered 2020-05-21 – 2020-05-23 (×2): 100 ug/h via INTRAVENOUS
  Administered 2020-05-23 – 2020-05-24 (×2): 250 ug/h via INTRAVENOUS
  Filled 2020-05-21 (×4): qty 250

## 2020-05-21 MED ORDER — CEFAZOLIN SODIUM-DEXTROSE 2-4 GM/100ML-% IV SOLN
2.0000 g | INTRAVENOUS | Status: AC
  Administered 2020-05-21: 2 g via INTRAVENOUS
  Filled 2020-05-21: qty 100

## 2020-05-21 MED ORDER — LACTATED RINGERS IV SOLN: INTRAVENOUS | Status: DC

## 2020-05-21 MED ORDER — PAROXETINE HCL 10 MG PO TABS
10.0000 mg | ORAL_TABLET | Freq: Every day | ORAL | Status: DC
  Filled 2020-05-21 (×2): qty 1

## 2020-05-21 MED ORDER — VASOPRESSIN 20 UNIT/ML IV SOLN
INTRAVENOUS | Status: DC | PRN
  Administered 2020-05-21: 3 [IU] via INTRAVENOUS
  Administered 2020-05-21: 2 [IU] via INTRAVENOUS
  Administered 2020-05-21: 1 [IU] via INTRAVENOUS
  Administered 2020-05-21: 3 [IU] via INTRAVENOUS
  Administered 2020-05-21 (×3): 1 [IU] via INTRAVENOUS
  Administered 2020-05-21: 3 [IU] via INTRAVENOUS

## 2020-05-21 MED ORDER — MIDAZOLAM HCL 2 MG/2ML IJ SOLN
INTRAMUSCULAR | Status: AC
  Filled 2020-05-21: qty 2

## 2020-05-21 MED ORDER — NOREPINEPHRINE 4 MG/250ML-% IV SOLN
0.0000 ug/min | INTRAVENOUS | Status: DC
  Administered 2020-05-21: 10 ug/min via INTRAVENOUS

## 2020-05-21 MED ORDER — ONDANSETRON HCL 4 MG/2ML IJ SOLN
INTRAMUSCULAR | Status: AC
  Filled 2020-05-21: qty 2

## 2020-05-21 MED ORDER — LIDOCAINE HCL (CARDIAC) PF 100 MG/5ML IV SOSY
PREFILLED_SYRINGE | INTRAVENOUS | Status: DC | PRN
  Administered 2020-05-21: 100 mg via INTRAVENOUS

## 2020-05-21 MED ORDER — ACETAMINOPHEN 325 MG PO TABS: 650.0000 mg | ORAL_TABLET | Freq: Four times a day (QID) | ORAL | Status: DC | PRN

## 2020-05-21 MED ORDER — STERILE WATER FOR INJECTION IV SOLN
INTRAVENOUS | Status: AC
  Filled 2020-05-21 (×3): qty 1000

## 2020-05-21 MED ORDER — EPHEDRINE SULFATE 50 MG/ML IJ SOLN
INTRAMUSCULAR | Status: DC | PRN
  Administered 2020-05-21: 10 mg via INTRAVENOUS
  Administered 2020-05-21 (×3): 5 mg via INTRAVENOUS
  Administered 2020-05-21: 10 mg via INTRAVENOUS

## 2020-05-21 MED ORDER — PROPOFOL 1000 MG/100ML IV EMUL
0.0000 ug/kg/min | INTRAVENOUS | Status: DC
  Administered 2020-05-21: 20 ug/kg/min via INTRAVENOUS

## 2020-05-21 MED ORDER — ALLOPURINOL 300 MG PO TABS: 300.0000 mg | ORAL_TABLET | Freq: Every day | ORAL | Status: DC

## 2020-05-21 MED ORDER — PHENYLEPHRINE HCL-NACL 10-0.9 MG/250ML-% IV SOLN: 25.0000 ug/min | INTRAVENOUS | Status: DC

## 2020-05-21 MED ORDER — CHLORHEXIDINE GLUCONATE 0.12 % MT SOLN
OROMUCOSAL | Status: AC
  Administered 2020-05-21: 15 mL via OROMUCOSAL
  Filled 2020-05-21: qty 15

## 2020-05-21 MED ORDER — PHENYLEPHRINE 40 MCG/ML (10ML) SYRINGE FOR IV PUSH (FOR BLOOD PRESSURE SUPPORT)
PREFILLED_SYRINGE | INTRAVENOUS | Status: DC | PRN
  Administered 2020-05-21: 40 ug via INTRAVENOUS
  Administered 2020-05-21 (×3): 80 ug via INTRAVENOUS
  Administered 2020-05-21: 120 ug via INTRAVENOUS
  Administered 2020-05-21: 160 ug via INTRAVENOUS
  Administered 2020-05-21: 120 ug via INTRAVENOUS

## 2020-05-21 MED ORDER — ONDANSETRON HCL 4 MG/2ML IJ SOLN
INTRAMUSCULAR | Status: DC | PRN
  Administered 2020-05-21: 4 mg via INTRAVENOUS

## 2020-05-21 MED ORDER — DEXAMETHASONE SODIUM PHOSPHATE 10 MG/ML IJ SOLN
INTRAMUSCULAR | Status: DC | PRN
  Administered 2020-05-21: 10 mg via INTRAVENOUS

## 2020-05-21 MED ORDER — POVIDONE-IODINE 10 % EX SWAB: 2.0000 "application " | Freq: Once | CUTANEOUS | Status: DC

## 2020-05-21 MED ORDER — SODIUM CHLORIDE 0.9 % IR SOLN
Status: DC | PRN
  Administered 2020-05-21 (×2): 3000 mL
  Administered 2020-05-21: 6000 mL

## 2020-05-21 MED ORDER — PROPOFOL 10 MG/ML IV BOLUS
INTRAVENOUS | Status: AC
  Filled 2020-05-21: qty 20

## 2020-05-21 MED ORDER — FENTANYL BOLUS VIA INFUSION
25.0000 ug | INTRAVENOUS | Status: DC | PRN
  Administered 2020-05-23: 100 ug via INTRAVENOUS
  Administered 2020-05-23: 25 ug via INTRAVENOUS
  Administered 2020-05-23: 100 ug via INTRAVENOUS
  Filled 2020-05-21: qty 100

## 2020-05-21 MED ORDER — NOREPINEPHRINE 4 MG/250ML-% IV SOLN
INTRAVENOUS | Status: DC | PRN
  Administered 2020-05-21: 4 ug/min via INTRAVENOUS

## 2020-05-21 MED ORDER — CLINDAMYCIN PHOSPHATE 600 MG/50ML IV SOLN
600.0000 mg | Freq: Three times a day (TID) | INTRAVENOUS | Status: DC
  Administered 2020-05-21: 600 mg via INTRAVENOUS
  Filled 2020-05-21 (×2): qty 50

## 2020-05-21 MED ORDER — CHLORHEXIDINE GLUCONATE CLOTH 2 % EX PADS
6.0000 | MEDICATED_PAD | Freq: Every day | CUTANEOUS | Status: DC
  Administered 2020-05-22 – 2020-05-23 (×2): 6 via TOPICAL

## 2020-05-21 MED ORDER — MIDAZOLAM HCL 2 MG/2ML IJ SOLN
INTRAMUSCULAR | Status: DC | PRN
  Administered 2020-05-21 (×2): 1 mg via INTRAVENOUS

## 2020-05-21 MED ORDER — LIDOCAINE 2% (20 MG/ML) 5 ML SYRINGE
INTRAMUSCULAR | Status: AC
  Filled 2020-05-21: qty 5

## 2020-05-21 MED ORDER — ORAL CARE MOUTH RINSE
15.0000 mL | OROMUCOSAL | Status: DC
  Administered 2020-05-21 – 2020-05-24 (×23): 15 mL via OROMUCOSAL

## 2020-05-21 MED ORDER — CALCIUM GLUCONATE-NACL 2-0.675 GM/100ML-% IV SOLN
2.0000 g | Freq: Once | INTRAVENOUS | Status: AC
  Administered 2020-05-21: 2000 mg via INTRAVENOUS
  Filled 2020-05-21: qty 100

## 2020-05-21 MED ORDER — CHLORHEXIDINE GLUCONATE 0.12% ORAL RINSE (MEDLINE KIT)
15.0000 mL | Freq: Two times a day (BID) | OROMUCOSAL | Status: DC
  Administered 2020-05-22 – 2020-05-23 (×4): 15 mL via OROMUCOSAL

## 2020-05-21 MED ORDER — POLYETHYLENE GLYCOL 3350 17 G PO PACK
17.0000 g | PACK | Freq: Every day | ORAL | Status: DC
  Administered 2020-05-22 – 2020-05-23 (×2): 17 g
  Filled 2020-05-21 (×2): qty 1

## 2020-05-21 MED ORDER — CHLORHEXIDINE GLUCONATE 4 % EX LIQD: 60.0000 mL | Freq: Once | CUTANEOUS | Status: DC

## 2020-05-21 MED ORDER — SODIUM BICARBONATE 8.4 % IV SOLN
100.0000 meq | Freq: Once | INTRAVENOUS | Status: AC
  Administered 2020-05-21: 100 meq via INTRAVENOUS
  Filled 2020-05-21: qty 50

## 2020-05-21 MED ORDER — EPHEDRINE 5 MG/ML INJ
INTRAVENOUS | Status: AC
  Filled 2020-05-21: qty 10

## 2020-05-21 MED ORDER — UMECLIDINIUM BROMIDE 62.5 MCG/INH IN AEPB
1.0000 | INHALATION_SPRAY | Freq: Every day | RESPIRATORY_TRACT | Status: DC
  Administered 2020-05-21: 1 via RESPIRATORY_TRACT
  Filled 2020-05-21: qty 7

## 2020-05-21 MED ORDER — PROPOFOL 10 MG/ML IV BOLUS
INTRAVENOUS | Status: DC | PRN
  Administered 2020-05-21: 100 mg via INTRAVENOUS
  Administered 2020-05-21: 4 mg via INTRAVENOUS

## 2020-05-21 MED ORDER — SODIUM CHLORIDE 0.9% IV SOLUTION: Freq: Once | INTRAVENOUS | Status: DC

## 2020-05-21 MED ORDER — PHENYLEPHRINE HCL-NACL 10-0.9 MG/250ML-% IV SOLN
INTRAVENOUS | Status: DC | PRN
  Administered 2020-05-21: 25 ug/min via INTRAVENOUS

## 2020-05-21 MED ORDER — ROCURONIUM BROMIDE 10 MG/ML (PF) SYRINGE
PREFILLED_SYRINGE | INTRAVENOUS | Status: DC | PRN
  Administered 2020-05-21: 50 mg via INTRAVENOUS

## 2020-05-21 MED ORDER — DEXTROSE 50 % IV SOLN
INTRAVENOUS | Status: AC
  Administered 2020-05-21: 50 mL
  Filled 2020-05-21: qty 50

## 2020-05-21 MED ORDER — PROPOFOL 500 MG/50ML IV EMUL
INTRAVENOUS | Status: DC | PRN
  Administered 2020-05-21: 35 ug/kg/min via INTRAVENOUS

## 2020-05-21 MED ORDER — 0.9 % SODIUM CHLORIDE (POUR BTL) OPTIME
TOPICAL | Status: DC | PRN
  Administered 2020-05-21: 1000 mL

## 2020-05-21 MED ORDER — DOCUSATE SODIUM 50 MG/5ML PO LIQD
100.0000 mg | Freq: Two times a day (BID) | ORAL | Status: DC
  Administered 2020-05-22 – 2020-05-23 (×3): 100 mg
  Filled 2020-05-21 (×2): qty 10

## 2020-05-21 MED ORDER — CHLORHEXIDINE GLUCONATE 0.12 % MT SOLN
15.0000 mL | OROMUCOSAL | Status: AC
  Filled 2020-05-21: qty 15

## 2020-05-21 MED ORDER — DEXAMETHASONE SODIUM PHOSPHATE 10 MG/ML IJ SOLN
INTRAMUSCULAR | Status: AC
  Filled 2020-05-21: qty 1

## 2020-05-21 MED ORDER — FENTANYL CITRATE (PF) 250 MCG/5ML IJ SOLN
INTRAMUSCULAR | Status: AC
  Filled 2020-05-21: qty 5

## 2020-05-21 MED ORDER — PHENYLEPHRINE HCL-NACL 10-0.9 MG/250ML-% IV SOLN: 0.0000 ug/min | INTRAVENOUS | Status: DC

## 2020-05-21 MED ORDER — ARFORMOTEROL TARTRATE 15 MCG/2ML IN NEBU
15.0000 ug | INHALATION_SOLUTION | Freq: Two times a day (BID) | RESPIRATORY_TRACT | Status: DC
  Administered 2020-05-21: 15 ug via RESPIRATORY_TRACT
  Filled 2020-05-21: qty 2

## 2020-05-21 MED ORDER — SODIUM BICARBONATE 8.4 % IV SOLN
INTRAVENOUS | Status: DC | PRN
  Administered 2020-05-21: 50 meq via INTRAVENOUS

## 2020-05-21 MED ORDER — PANTOPRAZOLE SODIUM 40 MG IV SOLR
40.0000 mg | Freq: Every day | INTRAVENOUS | Status: DC
  Administered 2020-05-21 – 2020-05-23 (×3): 40 mg via INTRAVENOUS
  Filled 2020-05-21 (×3): qty 40

## 2020-05-21 MED ORDER — FENTANYL CITRATE (PF) 100 MCG/2ML IJ SOLN
INTRAMUSCULAR | Status: DC | PRN
  Administered 2020-05-21 (×4): 25 ug via INTRAVENOUS

## 2020-05-21 SURGICAL SUPPLY — 37 items
BNDG ELASTIC 6X10 VLCR STRL LF (GAUZE/BANDAGES/DRESSINGS) ×1 IMPLANT
BNDG ELASTIC 6X15 VLCR STRL LF (GAUZE/BANDAGES/DRESSINGS) ×1 IMPLANT
BNDG GAUZE ELAST 4 BULKY (GAUZE/BANDAGES/DRESSINGS) ×5 IMPLANT
CANISTER WOUND CARE 500ML ATS (WOUND CARE) ×1 IMPLANT
CHLORAPREP W/TINT 26 (MISCELLANEOUS) ×2 IMPLANT
COVER SURGICAL LIGHT HANDLE (MISCELLANEOUS) ×2 IMPLANT
DRAIN HEMOVAC 1/8 X 5 (WOUND CARE) ×2 IMPLANT
DRSG PAD ABDOMINAL 8X10 ST (GAUZE/BANDAGES/DRESSINGS) ×3 IMPLANT
EVACUATOR SILICONE 100CC (DRAIN) ×2 IMPLANT
FACESHIELD WRAPAROUND (MASK) ×6 IMPLANT
FACESHIELD WRAPAROUND OR TEAM (MASK) IMPLANT
GAUZE SPONGE 4X4 12PLY STRL (GAUZE/BANDAGES/DRESSINGS) ×2 IMPLANT
GLOVE BIOGEL M 7.0 STRL (GLOVE) ×4 IMPLANT
GLOVE BIOGEL M STRL SZ7.5 (GLOVE) ×2 IMPLANT
GLOVE BIOGEL PI IND STRL 7.5 (GLOVE) ×2 IMPLANT
GLOVE BIOGEL PI IND STRL 8.5 (GLOVE) ×1 IMPLANT
GLOVE BIOGEL PI INDICATOR 7.5 (GLOVE) ×2
GLOVE BIOGEL PI INDICATOR 8.5 (GLOVE) ×1
GLOVE ECLIPSE 8.0 STRL XLNG CF (GLOVE) IMPLANT
GLOVE ORTHO TXT STRL SZ7.5 (GLOVE) ×4 IMPLANT
GLOVE SURG ORTHO 8.5 STRL (GLOVE) ×6 IMPLANT
GOWN STRL REUS W/ TWL LRG LVL3 (GOWN DISPOSABLE) ×1 IMPLANT
GOWN STRL REUS W/ TWL XL LVL3 (GOWN DISPOSABLE) ×3 IMPLANT
GOWN STRL REUS W/TWL LRG LVL3 (GOWN DISPOSABLE) ×2
GOWN STRL REUS W/TWL XL LVL3 (GOWN DISPOSABLE) ×6
KIT BASIN OR (CUSTOM PROCEDURE TRAY) ×2 IMPLANT
MANIFOLD NEPTUNE II (INSTRUMENTS) ×3 IMPLANT
PACK ORTHO EXTREMITY (CUSTOM PROCEDURE TRAY) ×2 IMPLANT
PAD CAST 4YDX4 CTTN HI CHSV (CAST SUPPLIES) ×1 IMPLANT
PADDING CAST COTTON 4X4 STRL (CAST SUPPLIES) ×2
SPONGE LAP 18X18 RF (DISPOSABLE) ×4 IMPLANT
SUT ETHILON 2 0 PSLX (SUTURE) ×2 IMPLANT
SUT MON AB 2-0 CT1 36 (SUTURE) ×1 IMPLANT
SUT PDS AB 1 CTX 36 (SUTURE) IMPLANT
SUT VLOC 180 0 24IN GS25 (SUTURE) IMPLANT
SYR CONTROL 10ML LL (SYRINGE) ×2 IMPLANT
TOWEL GREEN STERILE (TOWEL DISPOSABLE) ×4 IMPLANT

## 2020-05-21 NOTE — Progress Notes (Signed)
eLink Physician-Brief Progress Note Patient Name: Jerry Wise DOB: 10-Jan-1940 MRN: [id number]   Date of Service  05/06/2020  HPI/Events of Note  PCCM daytime attending physician requested that follow-up CBC, ABG, and CMPbe ordered and reviewed by E-Link.  eICU Interventions  CBC, CMP, ABG ordered.        Kerry Kass Braya Habermehl 06/01/2020, 11:35 PM

## 2020-05-21 NOTE — H&P (View-Only) (Signed)
Reason for Consult:Right leg infection Referring Physician: Princella Ion Time called: [id number] Time at bedside: 0845   Jerry Wise is an 80 y.o. male.  HPI: Jerry Wise was sent to the ED yesterday from his PCP's office with pain, swelling, and redness of his right leg. Evaluation in the ED showed a gas producing infection in the right thigh and orthopedic surgery was consulted. He has become obtunded and cannot contribute to history.  Past Medical History:  Diagnosis Date  . Asthma    as child  . Cancer ([facility])    lung cancer  . Chronic renal insufficiency    NEPHROLOGIST-  DR Justin Mend -- LAST VISIT MARCH 2014  . Dyspnea   . GERD (gastroesophageal reflux disease)   . Gout   . Gouty arthritis    ALL JOINTS  . History of kidney stones   . History of prostate cancer    S/P RADIACTIVE SEED IMPLANTS  . Hydronephrosis, bilateral   . Hypertension   . 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 Pernicious anemia FOLLOWED BY DR WEBB   x3 feraheme injection --- last one 03-22-2012  . Urethral stricture     Past Surgical History:  Procedure Laterality Date  . CLOSED REDUCTION CLAVICAL FX     NO HARDWARE  . COLONOSCOPY    . CYSTO/  BALLOON DILATION OF URETHRAL STRICTURE AND BLADDER BX WITH FULGERATION  09-14-2007  . CYSTOSCOPY W/ URETERAL STENT PLACEMENT N/A 04/06/2012   Procedure: CYSTOSCOPY Balloon DILATION OF STRICTURE, Fulgeration Bladder Neck, Cystogram;  Surgeon: Malka So, MD;  Location: [facility];  Service: Urology;  Laterality: N/A;  . DIRECT LARYNGOSCOPY N/A 03/03/2017   Procedure: DIRECT LARYNGOSCOPY WITH nasopharyngoscopy and BIOPSY;  Surgeon: Rozetta Nunnery, MD;  Location: [place];  Service: ENT;  Laterality: N/A;  . IR THORACENTESIS ASP PLEURAL SPACE W/IMG GUIDE  06/19/2018  . KNEE SURGERY    . MEDIASTINOSCOPY N/A 03/03/2017   Procedure: MEDIASTINOSCOPY;  Surgeon: [name], MD;   Location: [facility] OR;  Service: Thoracic;  Laterality: N/A;  . MULTIPLE TOOTH EXTRACTIONS    . PARATHYROIDECTOMY Right 04/28/2012   Procedure:  RIGHT INFERIOR PARATHYROIDECTOMY;  Surgeon: Earnstine Regal, MD;  Location: [facility];  Service: General;  Laterality: Right;  . RADIOACTIVE PROSTATE SEED IMPLANTS  2000   prostate cancer  . VIDEO ASSISTED THORACOSCOPY (VATS)/ LOBECTOMY Right 03/03/2017   Procedure: VIDEO ASSISTED THORACOSCOPY (VATS)/RIGHT UPPER LOBECTOMY;  Surgeon: [name], MD;  Location: [place];  Service: Thoracic;  Laterality: Right;  Marland Kitchen VIDEO ASSISTED THORACOSCOPY (VATS)/WEDGE RESECTION Right 03/21/2017   Procedure: REDO VIDEO ASSISTED THORACOSCOPY (VATS)/WEDGE RESECTION;  Surgeon: [name], MD;  Location: [place];  Service: Thoracic;  Laterality: Right;  Marland Kitchen VIDEO BRONCHOSCOPY WITH ENDOBRONCHIAL NAVIGATION N/A 02/02/2017   Procedure: VIDEO BRONCHOSCOPY WITH ENDOBRONCHIAL NAVIGATION  with biopsies  and Fiducial Placement;  Surgeon: Collene Gobble, MD;  Location: [place];  Service: Thoracic;  Laterality: N/A;  . VIDEO BRONCHOSCOPY WITH ENDOBRONCHIAL ULTRASOUND N/A 02/02/2017   Procedure: VIDEO BRONCHOSCOPY WITH ENDOBRONCHIAL ULTRASOUND  with node biopsies;  Surgeon: Collene Gobble, MD;  Location: [place];  Service: Thoracic;  Laterality: N/A;    History reviewed. No pertinent family history.  Social History:  reports that he quit smoking about 3 years ago. His smoking use included cigarettes. He started smoking about 66 years ago. He has a 27.50 pack-year smoking history.  He has never used smokeless tobacco. He reports current alcohol use of about 14.0 standard drinks of alcohol per week. He reports that he does not use drugs.  Allergies:  Allergies  Allergen Reactions  . Shellfish-Derived Products Other (See Comments)    Medications: I have reviewed the patient's current medications.  Results for orders placed or performed during the hospital encounter of 05/11/2020 (from  the past 48 hour(s))  Lactic acid, plasma     Status: Abnormal   Collection Time: 05/30/2020 11:35 AM  Result Value Ref Range   Lactic Acid, Venous 6.6 (HH) 0.5 - 1.9 mmol/L    Comment: CRITICAL RESULT CALLED TO, READ BACK BY AND VERIFIED WITH: T.GLOSSON RN [id number] 05/19/2020 MCCORMICK K Performed at [facility] [address] [place], [place] [address]   Comprehensive metabolic panel     Status: Abnormal   Collection Time: 05/26/2020 11:35 AM  Result Value Ref Range   Sodium 134 (L) 135 - 145 mmol/L   Potassium 4.4 3.5 - 5.1 mmol/L   Chloride 96 (L) 98 - 111 mmol/L   CO2 17 (L) 22 - 32 mmol/L   Glucose, Bld 73 70 - 99 mg/dL    Comment: Glucose reference range applies only to samples taken after fasting for at least 8 hours.   BUN 87 (H) 8 - 23 mg/dL   Creatinine, Ser 2.37 (H) 0.61 - 1.24 mg/dL   Calcium 8.5 (L) 8.9 - 10.3 mg/dL   Total Protein 5.2 (L) 6.5 - 8.1 g/dL   Albumin 1.4 (L) 3.5 - 5.0 g/dL   AST 46 (H) 15 - 41 U/L   ALT 12 0 - 44 U/L   Alkaline Phosphatase 117 38 - 126 U/L   Total Bilirubin 2.4 (H) 0.3 - 1.2 mg/dL   GFR, Estimated 27 (L) >60 mL/min    Comment: (NOTE) Calculated using the CKD-EPI Creatinine Equation (2021)    Anion gap 21 (H) 5 - 15    Comment: Performed at [facility], [place] [address], [place], [place] [address]  CBC WITH DIFFERENTIAL     Status: Abnormal   Collection Time: 05/18/2020 11:35 AM  Result Value Ref Range   WBC 61.9 (HH) 4.0 - 10.5 K/uL    Comment: REPEATED TO VERIFY WHITE COUNT CONFIRMED ON SMEAR THIS CRITICAL RESULT HAS VERIFIED AND BEEN CALLED TO TORI GLOSSON RN BY ALLISON BENNETT ON 05 17 2022 AT 32, AND HAS BEEN READ BACK.     RBC 3.14 (L) 4.22 - 5.81 MIL/uL   Hemoglobin 8.8 (L) 13.0 - 17.0 g/dL   HCT 25.4 (L) 39.0 - 52.0 %   MCV 80.9 80.0 - 100.0 fL   MCH 28.0 26.0 - 34.0 pg   MCHC 34.6 30.0 - 36.0 g/dL   RDW 15.0 11.5 - 15.5 %   Platelets 417 (H) 150 - 400 K/uL   nRBC 0.0 0.0 - 0.2 %   Neutrophils Relative % 91 %    Neutro Abs 56.3 (H) 1.7 - 7.7 K/uL   Lymphocytes Relative 1 %   Lymphs Abs 0.6 (L) 0.7 - 4.0 K/uL   Monocytes Relative 4 %   Monocytes Absolute 2.4 (H) 0.1 - 1.0 K/uL   Eosinophils Relative 0 %   Eosinophils Absolute 0.0 0.0 - 0.5 K/uL   Basophils Relative 0 %   Basophils Absolute 0.0 0.0 - 0.1 K/uL   Immature Granulocytes 4 %   Abs Immature Granulocytes 2.56 (H) 0.00 - 0.07 K/uL  Comment: Performed at [facility], [place] [address], [place], [place] [address]  Protime-INR     Status: Abnormal   Collection Time: 05/06/2020 11:35 AM  Result Value Ref Range   Prothrombin Time 17.3 (H) 11.4 - 15.2 seconds   INR 1.4 (H) 0.8 - 1.2    Comment: (NOTE) INR goal varies based on device and disease states. Performed at [facility], [place] [address] [place], [place] [address]   APTT     Status: None   Collection Time: 05/12/2020 11:35 AM  Result Value Ref Range   aPTT 36 24 - 36 seconds    Comment: Performed at [facility] [address], [place], [place] [address]  Resp Panel by RT-PCR (Flu A&B, Covid) Nasopharyngeal Swab     Status: None   Collection Time: 05/04/2020 12:00 PM   Specimen: Nasopharyngeal Swab; Nasopharyngeal(NP) swabs in vial transport medium  Result Value Ref Range   SARS Coronavirus 2 by RT PCR NEGATIVE NEGATIVE    Comment: (NOTE) SARS-CoV-2 target nucleic acids are NOT DETECTED.  The SARS-CoV-2 RNA is generally detectable in upper respiratory specimens during the acute phase of infection. The lowest concentration of SARS-CoV-2 viral copies this assay can detect is 138 copies/mL. A negative result does not preclude SARS-Cov-2 infection and should not be used as the sole basis for treatment or other patient management decisions. A negative result may occur with  improper specimen collection/handling, submission of specimen other than nasopharyngeal swab, presence of viral mutation(s) within the areas targeted by this assay, and inadequate number of  viral copies(<138 copies/mL). A negative result must be combined with clinical observations, patient history, and epidemiological information. The expected result is Negative.  Fact Sheet for Patients:  EntrepreneurPulse.com.au  Fact Sheet for Healthcare Providers:  IncredibleEmployment.be  This test is no t yet approved or cleared by the [place] FDA and  has been authorized for detection and/or diagnosis of SARS-CoV-2 by FDA under an Emergency Use Authorization (EUA). This EUA will remain  in effect (meaning this test can be used) for the duration of the COVID-19 declaration under Section 564(b)(1) of the Act, 21 U.S.C.section 360bbb-3(b)(1), unless the authorization is terminated  or revoked sooner.       Influenza A by PCR NEGATIVE NEGATIVE   Influenza B by PCR NEGATIVE NEGATIVE    Comment: (NOTE) The Xpert Xpress SARS-CoV-2/FLU/RSV plus assay is intended as an aid in the diagnosis of influenza from Nasopharyngeal swab specimens and should not be used as a sole basis for treatment. Nasal washings and aspirates are unacceptable for Xpert Xpress SARS-CoV-2/FLU/RSV testing.  Fact Sheet for Patients: EntrepreneurPulse.com.au  Fact Sheet for Healthcare Providers: IncredibleEmployment.be  This test is not yet approved or cleared by the [place] FDA and has been authorized for detection and/or diagnosis of SARS-CoV-2 by FDA under an Emergency Use Authorization (EUA). This EUA will remain in effect (meaning this test can be used) for the duration of the COVID-19 declaration under Section 564(b)(1) of the Act, 21 U.S.C. section 360bbb-3(b)(1), unless the authorization is terminated or revoked.  Performed at [facility], [place] [address] [place], [place] [address]   Blood Culture (routine x 2)     Status: None (Preliminary result)   Collection Time: 05/19/2020 12:20 PM   Specimen: BLOOD  RIGHT ARM  Result Value Ref Range   Specimen Description BLOOD RIGHT ARM    Special Requests      BOTTLES DRAWN AEROBIC AND  ANAEROBIC Blood Culture adequate volume   Culture      NO GROWTH < 24 HOURS Performed at [facility] [address] [place], [place] [address]    Report Status PENDING   Blood Culture (routine x 2)     Status: None (Preliminary result)   Collection Time: 05/22/2020 12:25 PM   Specimen: BLOOD LEFT ARM  Result Value Ref Range   Specimen Description BLOOD LEFT ARM    Special Requests      BOTTLES DRAWN AEROBIC AND ANAEROBIC Blood Culture adequate volume   Culture      NO GROWTH < 24 HOURS Performed at [facility], [place] [address], [place], [place] [address]    Report Status PENDING   Urinalysis, Routine w reflex microscopic Urine, Catheterized     Status: Abnormal   Collection Time: 06/03/2020  1:45 PM  Result Value Ref Range   Color, Urine AMBER (A) YELLOW    Comment: BIOCHEMICALS MAY BE AFFECTED BY COLOR   APPearance TURBID (A) CLEAR   Specific Gravity, Urine 1.016 1.005 - 1.030   pH 5.0 5.0 - 8.0   Glucose, UA NEGATIVE NEGATIVE mg/dL   Hgb urine dipstick MODERATE (A) NEGATIVE   Bilirubin Urine NEGATIVE NEGATIVE   Ketones, ur NEGATIVE NEGATIVE mg/dL   Protein, ur 100 (A) NEGATIVE mg/dL   Nitrite NEGATIVE NEGATIVE   Leukocytes,Ua LARGE (A) NEGATIVE   RBC / HPF 11-20 0 - 5 RBC/hpf   WBC, UA >50 (H) 0 - 5 WBC/hpf   Bacteria, UA FEW (A) NONE SEEN   Squamous Epithelial / LPF 11-20 0 - 5   WBC Clumps PRESENT    Mucus PRESENT    Hyaline Casts, UA PRESENT    Non Squamous Epithelial 0-5 (A) NONE SEEN    Comment: Performed at [facility], [place] [address] [place], [place] [address]  Lactic acid, plasma     Status: Abnormal   Collection Time: 05/14/2020  2:22 PM  Result Value Ref Range   Lactic Acid, Venous 6.6 (HH) 0.5 - 1.9 mmol/L    Comment: CRITICAL VALUE NOTED.  VALUE IS CONSISTENT WITH PREVIOUSLY REPORTED AND CALLED VALUE. Performed at  [facility], [place] [address], [place], [place] [address]   Lactic acid, plasma     Status: Abnormal   Collection Time: 06/02/2020  6:36 PM  Result Value Ref Range   Lactic Acid, Venous 4.7 (HH) 0.5 - 1.9 mmol/L    Comment: CRITICAL VALUE NOTED.  VALUE IS CONSISTENT WITH PREVIOUSLY REPORTED AND CALLED VALUE. Performed at [facility], [place] [address], [place], [place] [address]   Lactic acid, plasma     Status: Abnormal   Collection Time: 05/22/2020  8:08 PM  Result Value Ref Range   Lactic Acid, Venous 4.5 (HH) 0.5 - 1.9 mmol/L    Comment: CRITICAL VALUE NOTED.  VALUE IS CONSISTENT WITH PREVIOUSLY REPORTED AND CALLED VALUE. Performed at [facility], [place] [address], [place], [place] [address]   MRSA PCR Screening     Status: None   Collection Time: 05/27/2020 11:32 PM   Specimen: Nasal Mucosa; Nasopharyngeal  Result Value Ref Range   MRSA by PCR NEGATIVE NEGATIVE    Comment:        The GeneXpert MRSA Assay (FDA approved for NASAL specimens only), is one component of a comprehensive MRSA colonization surveillance program. It is not intended to diagnose MRSA infection nor to guide or monitor treatment  for MRSA infections. Performed at [facility], [place] [address], [place], [place] [address]   Comprehensive metabolic panel     Status: Abnormal   Collection Time: 05/09/2020  1:20 AM  Result Value Ref Range   Sodium 135 135 - 145 mmol/L   Potassium 3.7 3.5 - 5.1 mmol/L   Chloride 102 98 - 111 mmol/L   CO2 18 (L) 22 - 32 mmol/L   Glucose, Bld 68 (L) 70 - 99 mg/dL    Comment: Glucose reference range applies only to samples taken after fasting for at least 8 hours.   BUN 89 (H) 8 - 23 mg/dL   Creatinine, Ser 2.05 (H) 0.61 - 1.24 mg/dL   Calcium 8.1 (L) 8.9 - 10.3 mg/dL   Total Protein 4.3 (L) 6.5 - 8.1 g/dL   Albumin 1.1 (L) 3.5 - 5.0 g/dL   AST 54 (H) 15 - 41 U/L   ALT 16 0 - 44 U/L   Alkaline Phosphatase 80 38 - 126 U/L   Total Bilirubin 2.7 (H) 0.3 - 1.2  mg/dL   GFR, Estimated 32 (L) >60 mL/min    Comment: (NOTE) Calculated using the CKD-EPI Creatinine Equation (2021)    Anion gap 15 5 - 15    Comment: Performed at [facility], [place] [address] [place], [place] [address]  CBC     Status: Abnormal   Collection Time: 05/19/2020  1:20 AM  Result Value Ref Range   WBC 43.4 (H) 4.0 - 10.5 K/uL   RBC 2.92 (L) 4.22 - 5.81 MIL/uL   Hemoglobin 8.2 (L) 13.0 - 17.0 g/dL   HCT 23.3 (L) 39.0 - 52.0 %   MCV 79.8 (L) 80.0 - 100.0 fL   MCH 28.1 26.0 - 34.0 pg   MCHC 35.2 30.0 - 36.0 g/dL   RDW 14.6 11.5 - 15.5 %   Platelets 359 150 - 400 K/uL   nRBC 0.0 0.0 - 0.2 %    Comment: Performed at [facility], [place] [address] [place], [place] [address]  Lactic acid, plasma     Status: Abnormal   Collection Time: 05/17/2020  1:20 AM  Result Value Ref Range   Lactic Acid, Venous 3.9 (HH) 0.5 - 1.9 mmol/L    Comment: CRITICAL VALUE NOTED.  VALUE IS CONSISTENT WITH PREVIOUSLY REPORTED AND CALLED VALUE. Performed at [facility], [place] [address] [place], [place] [address]     CT Knee Right Wo Contrast  Result Date: 06/01/2020 CLINICAL DATA:  Pain and swelling EXAM: CT OF THE right KNEE WITHOUT CONTRAST TECHNIQUE: Multidetector CT imaging of the right knee was performed according to the standard protocol. Multiplanar CT image reconstructions were also generated. COMPARISON:  None available FINDINGS: Bones/Joint/Cartilage Moderate to large knee effusion containing scattered foci of gas. No fracture or malalignment is visualized. Diffuse joint space calcifications. Moderate severe arthritis involving the medial tibiofemoral compartment with joint space narrowing, subarticular sclerosis and subarticular cysts. Question early erosive change along the medial joint margin, series 6, image 51, cortex slightly irregular and indistinct. Moderate arthritis of the lateral tibiofemoral joint space with prominent spurring. Ligaments Suboptimally assessed by CT.  Muscles and Tendons Quadriceps tendon grossly intact. Undulating appearance of the patellar ligament. Extensive gas collections within the visible portions of the vastus medialis muscle at the distal thigh with additional gas collections visualized in the medial vastus muscles as well. Cranial extent of gas is incompletely included. Soft tissues Extensive subcutaneous edema. Mild  rim enhancing gas and fluid collection along the medial aspect of the knee measuring approximately 5.1 cm AP by 1.7 cm transverse by 5.6 cm craniocaudad, suspicious for abscess. Coarse probable dystrophic calcifications in the prepatellar soft tissues. IMPRESSION: 1. Extensive gas collections within the medial and lateral vastus muscles of the distal thigh, concerning for necrotizing infection. Cranial extent of gas is incompletely visualized. Moderate to large knee effusion also containing gas bubbles, raising concern for joint space involvement of infection. Generalized edema and cellulitis about the knee with 5.6 cm mildly rim enhancing gas and fluid collection along the medial aspect of the knee concerning for soft tissue abscess. 2. Arthritis of the knee with chondrocalcinosis. Slightly irregular for joint space margin medial side of the knee, raising concern for osteomyelitis/septic arthritis given above findings, correlation with MRI is recommended. Critical Value/emergent results were called by telephone at the time of interpretation on 05/09/2020 at 11:20 pm to provider [facility] , who verbally acknowledged these results. Electronically Signed   By: Donavan Foil M.D.   On: 05/09/2020 23:20   DG Chest Port 1 View  Result Date: 05/29/2020 CLINICAL DATA:  Sepsis. EXAM: PORTABLE CHEST 1 VIEW COMPARISON:  June 27, 2018. FINDINGS: The heart size and mediastinal contours are within normal limits. No pneumothorax or pleural effusion is noted. Left lung is clear. Stable postsurgical changes and pleural thickening is seen involving  the right lung. No definite acute abnormality is noted. The visualized skeletal structures are unremarkable. IMPRESSION: No active disease. Electronically Signed   By: Marijo Conception M.D.   On: 05/16/2020 12:16   VAS [place] LOWER EXTREMITY VENOUS (DVT) (ONLY [facility] & [facility] 7a-7p)  Result Date: 05/27/2020  Lower Venous DVT Study Patient Name:  Jerry Wise  Date of Exam:   05/14/2020 Medical Rec #: [id number]      Accession #:    [id number] Date of Birth: 19-Nov-1940      Patient Gender: M Patient Age:   80Y Exam Location:  [facility] Procedure:      VAS [place] LOWER EXTREMITY VENOUS (DVT) Referring Phys: [id number] [facility] PATEL --------------------------------------------------------------------------------  Indications: Pain, Erythema, and Swelling.  Comparison Study: No prior studies. Performing Technologist: Darlin Coco RDMS,RVT  Examination Guidelines: A complete evaluation includes B-mode imaging, spectral Doppler, color Doppler, and power Doppler as needed of all accessible portions of each vessel. Bilateral testing is considered an integral part of a complete examination. Limited examinations for reoccurring indications may be performed as noted. The reflux portion of the exam is performed with the patient in reverse Trendelenburg.  +---------+---------------+---------+-----------+----------+--------------+ RIGHT    CompressibilityPhasicitySpontaneityPropertiesThrombus Aging +---------+---------------+---------+-----------+----------+--------------+ CFV      Full           Yes      Yes                                 +---------+---------------+---------+-----------+----------+--------------+ SFJ      Full                                                        +---------+---------------+---------+-----------+----------+--------------+ FV Prox  Full                                                        +---------+---------------+---------+-----------+----------+--------------+  FV Mid    Full                                                        +---------+---------------+---------+-----------+----------+--------------+ FV DistalFull                                                        +---------+---------------+---------+-----------+----------+--------------+ PFV      Full                                                        +---------+---------------+---------+-----------+----------+--------------+ POP      Full           Yes      Yes                                 +---------+---------------+---------+-----------+----------+--------------+ PTV      Full                                                        +---------+---------------+---------+-----------+----------+--------------+ PERO     Full                                                        +---------+---------------+---------+-----------+----------+--------------+   +----+---------------+---------+-----------+----------+--------------+ LEFTCompressibilityPhasicitySpontaneityPropertiesThrombus Aging +----+---------------+---------+-----------+----------+--------------+ CFV Full           Yes      Yes                                 +----+---------------+---------+-----------+----------+--------------+     Summary: RIGHT: - There is no evidence of deep vein thrombosis in the lower extremity.  - No cystic structure found in the popliteal fossa.  - Incidental: Large, irregular focal collection containing mixed echogenicities identified from the proximal to distal medial thigh.  LEFT: - No evidence of common femoral vein obstruction.  *See table(s) above for measurements and observations. Electronically signed by Deitra Mayo MD on 05/05/2020 at 3:45:30 PM.    Final     Review of Systems  Unable to perform ROS: Mental status change   Blood pressure (!) 101/45, pulse (!) 104, temperature 98.1 F (36.7 C), temperature source Oral, resp. rate (!) 22, height 6\' 2"  (1.88 m),  weight 65.3 kg, SpO2 100 %. Physical Exam Constitutional:      General: He is not in acute distress.    Appearance: He is well-developed. He is not diaphoretic.  HENT:     Head: Normocephalic and atraumatic.  Eyes:     General: No scleral icterus.  Right eye: No discharge.        Left eye: No discharge.     Conjunctiva/sclera: Conjunctivae normal.  Cardiovascular:     Rate and Rhythm: Normal rate and regular rhythm.  Pulmonary:     Effort: Pulmonary effort is normal. No respiratory distress.  Musculoskeletal:     Cervical back: Normal range of motion.     Comments: RLE No traumatic wounds or rash, ecchymotic about knee  Seemingly significant TTP thigh, copious SQE  Knee stable to varus/ valgus and anterior/posterior stress  Sens DPN, SPN, TN could not assess  Motor EHL, ext, flex, evers grossly intact  DP 0, PT 0, 3+ pitting edema  Skin:    General: Skin is warm and dry.  Neurological:     Mental Status: He is alert.  Psychiatric:        Behavior: Behavior normal.     Assessment/Plan: RLE infection -- Plan I&D with Dr. Lyla Glassing this afternoon. Please keep NPO. Multiple medical problems including tobacco use, lung cancer status post right upper lobectomy on surveillance, history of prostate cancer status post radioactive seed implants, chronic indwelling Foley catheter, COPD not on home oxygen, CKD stage IIIa, PVCs, hypertension, and gout -- per primary service    Lisette Abu, PA-C Orthopedic Surgery [phone] 05/29/2020, 8:49 AM

## 2020-05-21 NOTE — Anesthesia Preprocedure Evaluation (Addendum)
Anesthesia Evaluation  Patient identified by MRN, date of birth, ID band Patient confused    Reviewed: Allergy & Precautions, NPO status , Patient's Chart, lab work & pertinent test results  Airway Mallampati: I  TM Distance: >3 FB Neck ROM: Full    Dental no notable dental hx. (+) Edentulous Upper, Edentulous Lower   Pulmonary shortness of breath, asthma , COPD, Patient abstained from smoking., former smoker,  Lung CA   Pulmonary exam normal breath sounds clear to auscultation       Cardiovascular hypertension, Pt. on medications Normal cardiovascular exam Rhythm:Regular Rate:Normal  5/17  SR w PVCs R 97   Neuro/Psych negative neurological ROS  negative psych ROS   GI/Hepatic GERD  ,  Endo/Other    Renal/GU CRFRenal diseaseLab Results      Component                Value               Date                      CREATININE               2.05 (H)            05/28/2020                BUN                      89 (H)              05/05/2020                NA                       135                 05/22/2020                K                        3.7                 05/11/2020                CL                       102                 06/02/2020                CO2                      18 (L)              05/31/2020              Prostate CA    Musculoskeletal  (+) Arthritis ,   Abdominal   Peds  Hematology  (+) anemia , Lab Results      Component                Value               Date                      WBC  43.4 (H)            06/01/2020                HGB                      8.2 (L)             05/12/2020                HCT                      23.3 (L)            05/13/2020                MCV                      79.8 (L)            05/15/2020                PLT                      359                 05/04/2020              Anesthesia Other Findings   Reproductive/Obstetrics                             Anesthesia Physical Anesthesia Plan  ASA: IV and emergent  Anesthesia Plan: General   Post-op Pain Management:    Induction: Intravenous  PONV Risk Score and Plan: 2 and Treatment may vary due to age or medical condition and Ondansetron  Airway Management Planned: LMA and Oral ETT  Additional Equipment: Arterial line and CVP  Intra-op Plan:   Post-operative Plan: Possible Post-op intubation/ventilation and Extubation in OR  Informed Consent: I have reviewed the patients History and Physical, chart, labs and discussed the procedure including the risks, benefits and alternatives for the proposed anesthesia with the patient or authorized representative who has indicated his/her understanding and acceptance.     Dental advisory given and Consent reviewed with POA  Plan Discussed with: CRNA and Anesthesiologist  Anesthesia Plan Comments: (LMA GA  Consent obtained from Wife  Intraoperatively A line placed  And pt intubated  CVP placed at end of case  gor transport to @H )      Anesthesia Quick Evaluation

## 2020-05-21 NOTE — Progress Notes (Signed)
I updated Mrs. Quizhpi on her husband's care. We discussed his severe illness and my concern for a grim prognosis. She understands he has multiple medical comorbidities and limited reserve. She wishes for him to be DNR and wants to continue aggressive care until her 5 children can come visit tonight. D/w charge RN. Bedside RN aware. Code status updated in chart. \  Julian Hy, DO 05/28/2020 7:09 PM [facility]

## 2020-05-21 NOTE — Interval H&P Note (Signed)
History and Physical Interval Note:  05/10/2020 2:27 PM  Shaquel L Henegar  has presented today for surgery, with the diagnosis of Right Leg infection.  The various methods of treatment have been discussed with the patient and family. After consideration of risks, benefits and other options for treatment, the patient has consented to  Procedure(s): IRRIGATION AND DEBRIDEMENT RIGHT LEG (Right) as a surgical intervention.  The patient's history has been reviewed, patient examined, no change in status, stable for surgery.  I have reviewed the patient's chart and labs.  Questions were answered to the patient's satisfaction.    The risks, benefits, and alternatives were discussed with the patient / wife. There are risks associated with the surgery including, but not limited to, problems with anesthesia (death), infection, fracture of bones, stiffness, worsening of arthritis, recurrence of infection, hematoma (blood accumulation) which may require surgical drainage, blood clots, pulmonary embolism, nerve injury (foot drop), and blood vessel injury. The patient understands these risks and elects to proceed.    Hilton Cork Tarrin Lebow

## 2020-05-21 NOTE — Anesthesia Procedure Notes (Addendum)
Procedure Name: Intubation Date/Time: 05/10/2020 5:15 PM Performed by: Candis Shine, CRNA Pre-anesthesia Checklist: Patient identified, Emergency Drugs available, Suction available and Patient being monitored Patient Re-evaluated:Patient Re-evaluated prior to induction Oxygen Delivery Method: Circle System Utilized Preoxygenation: Pre-oxygenation with 100% oxygen Induction Type: IV induction Ventilation: Mask ventilation without difficulty Laryngoscope Size: Mac and 3 Grade View: Grade I Tube type: Oral Tube size: 7.5 mm Number of attempts: 1 Airway Equipment and Method: Stylet Placement Confirmation: ETT inserted through vocal cords under direct vision,  positive ETCO2 and breath sounds checked- equal and bilateral Secured at: 22 cm Tube secured with: Tape Dental Injury: Teeth and Oropharynx as per pre-operative assessment

## 2020-05-21 NOTE — Anesthesia Procedure Notes (Addendum)
Central Venous Catheter Insertion Performed by: Barnet Glasgow, MD, anesthesiologist Start/End05/05/2020 5:42 PM, 05/14/2020 5:57 PM Patient location: Pre-op. Preanesthetic checklist: patient identified, IV checked, site marked, risks and benefits discussed, surgical consent, monitors and equipment checked, pre-op evaluation, timeout performed and anesthesia consent Lidocaine 1% used for infiltration and patient sedated Hand hygiene performed  and maximum sterile barriers used  Catheter size: 8 Fr Total catheter length 16. Central line was placed.Double lumen Procedure performed using ultrasound guided technique. Ultrasound Notes:image(s) printed for medical record Attempts: 1 Following insertion, dressing applied, line sutured and Biopatch. Post procedure assessment: blood return through all ports, free fluid flow and no air  Patient tolerated the procedure well with no immediate complications.

## 2020-05-21 NOTE — Transfer of Care (Signed)
Immediate Anesthesia Transfer of Care Note  Patient: Jerry Wise  Procedure(s) Performed: IRRIGATION AND DEBRIDEMENT RIGHT LEG (Right )  Patient Location: SICU  Anesthesia Type:General  Level of Consciousness: Patient remains intubated per anesthesia plan  Airway & Oxygen Therapy: Patient remains intubated per anesthesia plan and Patient placed on Ventilator (see vital sign flow sheet for setting)  Post-op Assessment: Report given to RN and Post -op Vital signs reviewed and stable  Post vital signs: Reviewed and stable  Last Vitals:  Vitals Value Taken Time  BP 139/69 05/27/2020 1837  Temp    Pulse 94 05/14/2020 1843  Resp 22 05/11/2020 1843  SpO2 34 % 05/30/2020 1843  Vitals shown include unvalidated device data.  Last Pain:  Vitals:   06/03/2020 1200  TempSrc: Oral  PainSc:          Complications: No complications documented.

## 2020-05-21 NOTE — Procedures (Signed)
Procedure: Right knee aspiration   Indication: Right knee effusion(s)  Surgeon: Silvestre Gunner, PA-C  Assist: None  Anesthesia: Topical refrigerant  EBL: None  Complications: None  Findings: After risks/benefits explained patient's wife desires to undergo procedure. Consent obtained and time out performed. The right knee was sterilely prepped and aspirated. 1ml purulent yellow fluid obtained. Pt tolerated the procedure well.    Lisette Abu, PA-C Orthopedic Surgery [phone]

## 2020-05-21 NOTE — Progress Notes (Signed)
eLink Physician-Brief Progress Note Patient Name: LESEAN WOOLVERTON DOB: 10-Mar-1940 MRN: [id number]   Date of Service  05/12/2020  HPI/Events of Note  Family requesting an update on their loved ones condition.  eICU Interventions  Meeting with the family during which update provided.        Kerry Kass Adrick Kestler 05/07/2020, 10:11 PM

## 2020-05-21 NOTE — Progress Notes (Signed)
Pharmacy Antibiotic Note  Jerry Wise is a 80 y.o. male admitted on 05/22/2020.  Pharmacy has been consulted to change cefepime to Zosyn for necrotizing soft tissue infection.  Plan: Zosyn 3.375g IV q8h (4 hour infusion).  Will need to adjust if renal function worsens.  Height: 6\' 2"  (188 cm) Weight: 65.3 kg (143 lb 15.4 oz) IBW/kg (Calculated) : 82.2  Temp (24hrs), Avg:97.9 F (36.6 C), Min:97.4 F (36.3 C), Max:98.3 F (36.8 C)  Recent Labs  Lab 05/07/2020 1135 05/10/2020 1422 05/23/2020 1836 05/26/2020 2008  WBC 61.9*  --   --   --   CREATININE 2.37*  --   --   --   LATICACIDVEN 6.6* 6.6* 4.7* 4.5*    Estimated Creatinine Clearance: 23 mL/min (A) (by C-G formula based on SCr of 2.37 mg/dL (H)).    Allergies  Allergen Reactions  . Shellfish-Derived Products Other (See Comments)     Thank you for allowing pharmacy to be a part of this patient's care.  Wynona Neat, PharmD, BCPS  05/17/2020 1:04 AM

## 2020-05-21 NOTE — Progress Notes (Addendum)
Progress Note    Jerry Wise  JKD:[id number] DOB: 19-Oct-1940  DOA: 05/27/2020 PCP: Josetta Huddle, MD    Brief Narrative:    Medical records reviewed and are as summarized below:  Jerry Wise is an 80 y.o. male with medical history significant of former tobacco use, lung cancer status post right upper lobectomy on surveillance, history of prostate cancer status post radioactive seed implants, chronic indwelling Foley catheter, COPD not on home oxygen, CKD stage IIIa, PVCs, hypertension, gout presented to the ED today from his PCPs office for evaluation of redness and swelling of his right leg.    Assessment/Plan:   Principal Problem:   Necrotizing soft tissue infection Active Problems:   Severe sepsis (HCC)   Catheter-associated urinary tract infection (HCC)   AKI (acute kidney injury) ([place])   Metabolic acidosis   Severe sepsis secondary to right lower extremity necrotizing soft tissue infection with concern for abscess, osteomyelitis, knee septic arthritis -significant leukocytosis (WBC 09.9 with neutrophilic predominance) and significant lactic acidosis (lactic acid 6.6).   -CT of right knee without contrast showing extensive gas collections within the medial and lateral vastus muscles of the distal thigh concerning for necrotizing infection.  Also showing moderate to large knee effusion containing gas bubbles concerning for joint space involvement of infection.  Showing generalized edema and cellulitis about the knee with 5.6 cm mildly rim enhancing gas and fluid collection along the medial aspect of the knee concerning for soft tissue abscess.  Arthritis of the knee with chondrocalcinosis.  Slightly irregular for joint space margin medial side of the knee raising concern for osteomyelitis/septic arthritis. -Vancomycin, Zosyn.   -Blood culture x2 pending.   -PDX consult: 5/18: Plan for I&D with Dr. Lyla Glassing.  Possible catheter associated urinary tract infection Also  possibly contributing to severe sepsis.  Has a chronic indwelling Foley catheter and UA showing large amount of leukocytes, greater than 50 WBCs, and few bacteria.  Reportedly treated for UTI with Levaquin 2 weeks ago.  - Foley was changed on 5/17 -Continue antibiotics.  Urine culture pending.  AKI on CKD stage IIIa Likely prerenal azotemia from hypotension in the setting of severe sepsis.  Appears dehydrated on exam. BUN 87, creatinine 2.3 (baseline 1.4). -IV fluid hydration and continue to monitor renal function.  Avoid nephrotoxic agents/contrast.  High anion gap metabolic acidosis Likely due to AKI.  Bicarb 17, anion gap 21. -IV fluid hydration, continue to monitor  Mild hyperbilirubinemia Possibly due to severe sepsis.   -Daily labs  COPD Stable.  No signs of acute exacerbation. -Continue home inhalers  Gout -Continue allopurinol  Depression -Continue Paxil  Alcohol use -CIWA monitoring    Family Communication/Anticipated D/C date and plan/Code Status   DVT prophylaxis: Lovenox to start when okay with orthopedics Code Status: Full Code.  Family Communication: Called wife, no answer, left message Disposition Plan: Status is: Inpatient   Remains inpatient appropriate because:Inpatient level of care appropriate due to severity of illness   Dispo: The patient is from: Home              Anticipated d/c is to: tbd              Patient currently is not medically stable to d/c.   Difficult to place patient No    Poor overall prognosis.  Reached out to wife to discuss CODE STATUS and patient's baseline at home but no answer.     Medical Consultants:    Ortho  PCCM  Subjective:   Minimally responsive  Objective:    Vitals:   05/05/2020 0320 05/16/2020 0801 05/27/2020 0832 05/18/2020 0836  BP: (!) 110/46 (!) 101/45 (!) 101/45   Pulse: (!) 105 (!) 101 (!) 104   Resp: 19 (!) 22 (!) 22   Temp: 98.7 F (37.1 C) 98.1 F (36.7 C)    TempSrc: Oral Oral     SpO2: 96% 96% 97% 100%  Weight:      Height:        Intake/Output Summary (Last 24 hours) at 05/11/2020 1049 Last data filed at 05/16/2020 0600 Gross per 24 hour  Intake 4969.34 ml  Output 300 ml  Net 4669.34 ml   Filed Weights   05/22/2020 1159 05/19/2020 2324  Weight: 61.2 kg 65.3 kg    Exam:  General: Appearance:    Thin male in no acute distress     Lungs:     respirations unlabored  Heart:    Tachycardic. Normal rhythm.   MS:   All extremities are intact. Right leg swollen and with erythema streaking up to groin  Neurologic:   Not awake or following commands    Data Reviewed:   I have personally reviewed following labs and imaging studies:  Labs: Labs show the following:   Basic Metabolic Panel: Recent Labs  Lab 05/30/2020 1135 05/13/2020 0120  NA 134* 135  K 4.4 3.7  CL 96* 102  CO2 17* 18*  GLUCOSE 73 68*  BUN 87* 89*  CREATININE 2.37* 2.05*  CALCIUM 8.5* 8.1*   GFR Estimated Creatinine Clearance: 26.5 mL/min (A) (by C-G formula based on SCr of 2.05 mg/dL (H)). Liver Function Tests: Recent Labs  Lab 05/15/2020 1135 05/19/2020 0120  AST 46* 54*  ALT 12 16  ALKPHOS 117 80  BILITOT 2.4* 2.7*  PROT 5.2* 4.3*  ALBUMIN 1.4* 1.1*   No results for input(s): LIPASE, AMYLASE in the last 168 hours. No results for input(s): AMMONIA in the last 168 hours. Coagulation profile Recent Labs  Lab 06/03/2020 1135  INR 1.4*    CBC: Recent Labs  Lab 05/10/2020 1135 05/17/2020 0120  WBC 61.9* 43.4*  NEUTROABS 56.3*  --   HGB 8.8* 8.2*  HCT 25.4* 23.3*  MCV 80.9 79.8*  PLT 417* 359   Cardiac Enzymes: No results for input(s): CKTOTAL, CKMB, CKMBINDEX, TROPONINI in the last 168 hours. BNP (last 3 results) No results for input(s): PROBNP in the last 8760 hours. CBG: No results for input(s): GLUCAP in the last 168 hours. D-Dimer: No results for input(s): DDIMER in the last 72 hours. Hgb A1c: No results for input(s): HGBA1C in the last 72 hours. Lipid  Profile: No results for input(s): CHOL, HDL, LDLCALC, TRIG, CHOLHDL, LDLDIRECT in the last 72 hours. Thyroid function studies: No results for input(s): TSH, T4TOTAL, T3FREE, THYROIDAB in the last 72 hours.  Invalid input(s): FREET3 Anemia work up: No results for input(s): VITAMINB12, FOLATE, FERRITIN, TIBC, IRON, RETICCTPCT in the last 72 hours. Sepsis Labs: Recent Labs  Lab 05/27/2020 1135 05/19/2020 1422 06/02/2020 1836 05/23/2020 2008 05/08/2020 0120  WBC 61.9*  --   --   --  43.4*  LATICACIDVEN 6.6* 6.6* 4.7* 4.5* 3.9*    Microbiology Recent Results (from the past 240 hour(s))  Resp Panel by RT-PCR (Flu A&B, Covid) Nasopharyngeal Swab     Status: None   Collection Time: 05/06/2020 12:00 PM   Specimen: Nasopharyngeal Swab; Nasopharyngeal(NP) swabs in vial transport medium  Result Value Ref Range Status   SARS  Coronavirus 2 by RT PCR NEGATIVE NEGATIVE Final    Comment: (NOTE) SARS-CoV-2 target nucleic acids are NOT DETECTED.  The SARS-CoV-2 RNA is generally detectable in upper respiratory specimens during the acute phase of infection. The lowest concentration of SARS-CoV-2 viral copies this assay can detect is 138 copies/mL. A negative result does not preclude SARS-Cov-2 infection and should not be used as the sole basis for treatment or other patient management decisions. A negative result may occur with  improper specimen collection/handling, submission of specimen other than nasopharyngeal swab, presence of viral mutation(s) within the areas targeted by this assay, and inadequate number of viral copies(<138 copies/mL). A negative result must be combined with clinical observations, patient history, and epidemiological information. The expected result is Negative.  Fact Sheet for Patients:  EntrepreneurPulse.com.au  Fact Sheet for Healthcare Providers:  IncredibleEmployment.be  This test is no t yet approved or cleared by the [place]  FDA and  has been authorized for detection and/or diagnosis of SARS-CoV-2 by FDA under an Emergency Use Authorization (EUA). This EUA will remain  in effect (meaning this test can be used) for the duration of the COVID-19 declaration under Section 564(b)(1) of the Act, 21 U.S.C.section 360bbb-3(b)(1), unless the authorization is terminated  or revoked sooner.       Influenza A by PCR NEGATIVE NEGATIVE Final   Influenza B by PCR NEGATIVE NEGATIVE Final    Comment: (NOTE) The Xpert Xpress SARS-CoV-2/FLU/RSV plus assay is intended as an aid in the diagnosis of influenza from Nasopharyngeal swab specimens and should not be used as a sole basis for treatment. Nasal washings and aspirates are unacceptable for Xpert Xpress SARS-CoV-2/FLU/RSV testing.  Fact Sheet for Patients: EntrepreneurPulse.com.au  Fact Sheet for Healthcare Providers: IncredibleEmployment.be  This test is not yet approved or cleared by the [place] FDA and has been authorized for detection and/or diagnosis of SARS-CoV-2 by FDA under an Emergency Use Authorization (EUA). This EUA will remain in effect (meaning this test can be used) for the duration of the COVID-19 declaration under Section 564(b)(1) of the Act, 21 U.S.C. section 360bbb-3(b)(1), unless the authorization is terminated or revoked.  Performed at [facility], [place] [address] [place], [place] [address]   Blood Culture (routine x 2)     Status: None (Preliminary result)   Collection Time: 05/23/2020 12:20 PM   Specimen: BLOOD RIGHT ARM  Result Value Ref Range Status   Specimen Description BLOOD RIGHT ARM  Final   Special Requests   Final    BOTTLES DRAWN AEROBIC AND ANAEROBIC Blood Culture adequate volume   Culture   Final    NO GROWTH < 24 HOURS Performed at [facility], [place] [address], [place], [place] [address]    Report Status PENDING  Incomplete  Blood Culture (routine x 2)     Status: None  (Preliminary result)   Collection Time: 05/15/2020 12:25 PM   Specimen: BLOOD LEFT ARM  Result Value Ref Range Status   Specimen Description BLOOD LEFT ARM  Final   Special Requests   Final    BOTTLES DRAWN AEROBIC AND ANAEROBIC Blood Culture adequate volume   Culture   Final    NO GROWTH < 24 HOURS Performed at [facility], [place] [address] [place], [place] [address]    Report Status PENDING  Incomplete  MRSA PCR Screening     Status: None   Collection Time: 05/30/2020 11:32 PM   Specimen: Nasal Mucosa; Nasopharyngeal  Result  Value Ref Range Status   MRSA by PCR NEGATIVE NEGATIVE Final    Comment:        The GeneXpert MRSA Assay (FDA approved for NASAL specimens only), is one component of a comprehensive MRSA colonization surveillance program. It is not intended to diagnose MRSA infection nor to guide or monitor treatment for MRSA infections. Performed at [facility], [place] [address], [place], [place] [address]     Procedures and diagnostic studies:  CT Knee Right Wo Contrast  Result Date: 05/16/2020 CLINICAL DATA:  Pain and swelling EXAM: CT OF THE right KNEE WITHOUT CONTRAST TECHNIQUE: Multidetector CT imaging of the right knee was performed according to the standard protocol. Multiplanar CT image reconstructions were also generated. COMPARISON:  None available FINDINGS: Bones/Joint/Cartilage Moderate to large knee effusion containing scattered foci of gas. No fracture or malalignment is visualized. Diffuse joint space calcifications. Moderate severe arthritis involving the medial tibiofemoral compartment with joint space narrowing, subarticular sclerosis and subarticular cysts. Question early erosive change along the medial joint margin, series 6, image 51, cortex slightly irregular and indistinct. Moderate arthritis of the lateral tibiofemoral joint space with prominent spurring. Ligaments Suboptimally assessed by CT. Muscles and Tendons Quadriceps tendon grossly  intact. Undulating appearance of the patellar ligament. Extensive gas collections within the visible portions of the vastus medialis muscle at the distal thigh with additional gas collections visualized in the medial vastus muscles as well. Cranial extent of gas is incompletely included. Soft tissues Extensive subcutaneous edema. Mild rim enhancing gas and fluid collection along the medial aspect of the knee measuring approximately 5.1 cm AP by 1.7 cm transverse by 5.6 cm craniocaudad, suspicious for abscess. Coarse probable dystrophic calcifications in the prepatellar soft tissues. IMPRESSION: 1. Extensive gas collections within the medial and lateral vastus muscles of the distal thigh, concerning for necrotizing infection. Cranial extent of gas is incompletely visualized. Moderate to large knee effusion also containing gas bubbles, raising concern for joint space involvement of infection. Generalized edema and cellulitis about the knee with 5.6 cm mildly rim enhancing gas and fluid collection along the medial aspect of the knee concerning for soft tissue abscess. 2. Arthritis of the knee with chondrocalcinosis. Slightly irregular for joint space margin medial side of the knee, raising concern for osteomyelitis/septic arthritis given above findings, correlation with MRI is recommended. Critical Value/emergent results were called by telephone at the time of interpretation on 05/23/2020 at 11:20 pm to provider [facility] , who verbally acknowledged these results. Electronically Signed   By: Donavan Foil M.D.   On: 05/11/2020 23:20   DG Chest Port 1 View  Result Date: 05/15/2020 CLINICAL DATA:  Sepsis. EXAM: PORTABLE CHEST 1 VIEW COMPARISON:  June 27, 2018. FINDINGS: The heart size and mediastinal contours are within normal limits. No pneumothorax or pleural effusion is noted. Left lung is clear. Stable postsurgical changes and pleural thickening is seen involving the right lung. No definite acute abnormality  is noted. The visualized skeletal structures are unremarkable. IMPRESSION: No active disease. Electronically Signed   By: Marijo Conception M.D.   On: 05/31/2020 12:16   VAS [place] LOWER EXTREMITY VENOUS (DVT) (ONLY [facility] & [facility] 7a-7p)  Result Date: 05/23/2020  Lower Venous DVT Study Patient Name:  DORIS MCGILVERY  Date of Exam:   05/31/2020 Medical Rec #: [id number]      Accession #:    [id number] Date of Birth: 24-Nov-1940      Patient Gender: M Patient Age:   60Y  Exam Location:  [facility] Procedure:      VAS [place] LOWER EXTREMITY VENOUS (DVT) Referring Phys: [id number] [facility] PATEL --------------------------------------------------------------------------------  Indications: Pain, Erythema, and Swelling.  Comparison Study: No prior studies. Performing Technologist: Darlin Coco RDMS,RVT  Examination Guidelines: A complete evaluation includes B-mode imaging, spectral Doppler, color Doppler, and power Doppler as needed of all accessible portions of each vessel. Bilateral testing is considered an integral part of a complete examination. Limited examinations for reoccurring indications may be performed as noted. The reflux portion of the exam is performed with the patient in reverse Trendelenburg.  +---------+---------------+---------+-----------+----------+--------------+ RIGHT    CompressibilityPhasicitySpontaneityPropertiesThrombus Aging +---------+---------------+---------+-----------+----------+--------------+ CFV      Full           Yes      Yes                                 +---------+---------------+---------+-----------+----------+--------------+ SFJ      Full                                                        +---------+---------------+---------+-----------+----------+--------------+ FV Prox  Full                                                        +---------+---------------+---------+-----------+----------+--------------+ FV Mid   Full                                                         +---------+---------------+---------+-----------+----------+--------------+ FV DistalFull                                                        +---------+---------------+---------+-----------+----------+--------------+ PFV      Full                                                        +---------+---------------+---------+-----------+----------+--------------+ POP      Full           Yes      Yes                                 +---------+---------------+---------+-----------+----------+--------------+ PTV      Full                                                        +---------+---------------+---------+-----------+----------+--------------+ PERO     Full                                                        +---------+---------------+---------+-----------+----------+--------------+   +----+---------------+---------+-----------+----------+--------------+  LEFTCompressibilityPhasicitySpontaneityPropertiesThrombus Aging +----+---------------+---------+-----------+----------+--------------+ CFV Full           Yes      Yes                                 +----+---------------+---------+-----------+----------+--------------+     Summary: RIGHT: - There is no evidence of deep vein thrombosis in the lower extremity.  - No cystic structure found in the popliteal fossa.  - Incidental: Large, irregular focal collection containing mixed echogenicities identified from the proximal to distal medial thigh.  LEFT: - No evidence of common femoral vein obstruction.  *See table(s) above for measurements and observations. Electronically signed by Deitra Mayo MD on 05/17/2020 at 3:45:30 PM.    Final     Medications:   . allopurinol  300 mg Oral Daily  . arformoterol  15 mcg Nebulization BID  . [START ON 05/22/2020] Chlorhexidine Gluconate Cloth  6 each Topical Daily  . PARoxetine  10 mg Oral Daily  . umeclidinium bromide  1 puff Inhalation Daily    Continuous Infusions: . sodium chloride Stopped (05/31/2020 0300)  . piperacillin-tazobactam (ZOSYN)  IV 12.5 mL/hr at 05/18/2020 0600  . [START ON 05/22/2020] vancomycin       LOS: 1 day   Geradine Girt  [facility]   How to contact the [facility] Attending or Consulting provider [place] or covering provider during after hours [place], for this patient?  1. Check the care team in [facility] and look for a) attending/consulting [facility] provider listed and b) the [facility] team listed 2. Log into [email or web address] and use [place]'s universal password to access. If you do not have the password, please contact the hospital operator. 3. Locate the [facility] provider you are looking for under [facility] and page to a number that you can be directly reached. 4. If you still have difficulty reaching the provider, please page the [facility] (Director on Call) for the Hospitalists listed on amion for assistance.  05/25/2020, 10:49 AM

## 2020-05-21 NOTE — Consult Note (Signed)
Reason for Consult:Right leg infection Referring Physician: Princella Ion Time called: [id number] Time at bedside: 0845   Jerry Wise is an 80 y.o. male.  HPI: Jerry Wise was sent to the ED yesterday from his PCP's office with pain, swelling, and redness of his right leg. Evaluation in the ED showed a gas producing infection in the right thigh and orthopedic surgery was consulted. He has become obtunded and cannot contribute to history.  Past Medical History:  Diagnosis Date  . Asthma    as child  . Cancer ([place])    lung cancer  . Chronic renal insufficiency    NEPHROLOGIST-  DR Justin Mend -- LAST VISIT MARCH 2014  . Dyspnea   . GERD (gastroesophageal reflux disease)   . Gout   . Gouty arthritis    ALL JOINTS  . History of kidney stones   . History of prostate cancer    S/P RADIACTIVE SEED IMPLANTS  . Hydronephrosis, bilateral   . Hypertension   . 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 Pernicious anemia FOLLOWED BY DR WEBB   x3 feraheme injection --- last one 03-22-2012  . Urethral stricture     Past Surgical History:  Procedure Laterality Date  . CLOSED REDUCTION CLAVICAL FX     NO HARDWARE  . COLONOSCOPY    . CYSTO/  BALLOON DILATION OF URETHRAL STRICTURE AND BLADDER BX WITH FULGERATION  09-14-2007  . CYSTOSCOPY W/ URETERAL STENT PLACEMENT N/A 04/06/2012   Procedure: CYSTOSCOPY Balloon DILATION OF STRICTURE, Fulgeration Bladder Neck, Cystogram;  Surgeon: Malka So, MD;  Location: [facility];  Service: Urology;  Laterality: N/A;  . DIRECT LARYNGOSCOPY N/A 03/03/2017   Procedure: DIRECT LARYNGOSCOPY WITH nasopharyngoscopy and BIOPSY;  Surgeon: Rozetta Nunnery, MD;  Location: [place];  Service: ENT;  Laterality: N/A;  . IR THORACENTESIS ASP PLEURAL SPACE W/IMG GUIDE  06/19/2018  . KNEE SURGERY    . MEDIASTINOSCOPY N/A 03/03/2017   Procedure: MEDIASTINOSCOPY;  Surgeon: [name], MD;   Location: [facility] OR;  Service: Thoracic;  Laterality: N/A;  . MULTIPLE TOOTH EXTRACTIONS    . PARATHYROIDECTOMY Right 04/28/2012   Procedure:  RIGHT INFERIOR PARATHYROIDECTOMY;  Surgeon: Earnstine Regal, MD;  Location: [facility];  Service: General;  Laterality: Right;  . RADIOACTIVE PROSTATE SEED IMPLANTS  2000   prostate cancer  . VIDEO ASSISTED THORACOSCOPY (VATS)/ LOBECTOMY Right 03/03/2017   Procedure: VIDEO ASSISTED THORACOSCOPY (VATS)/RIGHT UPPER LOBECTOMY;  Surgeon: [name], MD;  Location: [place];  Service: Thoracic;  Laterality: Right;  Marland Kitchen VIDEO ASSISTED THORACOSCOPY (VATS)/WEDGE RESECTION Right 03/21/2017   Procedure: REDO VIDEO ASSISTED THORACOSCOPY (VATS)/WEDGE RESECTION;  Surgeon: [name], MD;  Location: [place];  Service: Thoracic;  Laterality: Right;  Marland Kitchen VIDEO BRONCHOSCOPY WITH ENDOBRONCHIAL NAVIGATION N/A 02/02/2017   Procedure: VIDEO BRONCHOSCOPY WITH ENDOBRONCHIAL NAVIGATION  with biopsies  and Fiducial Placement;  Surgeon: Collene Gobble, MD;  Location: [place];  Service: Thoracic;  Laterality: N/A;  . VIDEO BRONCHOSCOPY WITH ENDOBRONCHIAL ULTRASOUND N/A 02/02/2017   Procedure: VIDEO BRONCHOSCOPY WITH ENDOBRONCHIAL ULTRASOUND  with node biopsies;  Surgeon: Collene Gobble, MD;  Location: [place];  Service: Thoracic;  Laterality: N/A;    History reviewed. No pertinent family history.  Social History:  reports that he quit smoking about 3 years ago. His smoking use included cigarettes. He started smoking about 66 years ago. He has a 27.50 pack-year smoking history.  He has never used smokeless tobacco. He reports current alcohol use of about 14.0 standard drinks of alcohol per week. He reports that he does not use drugs.  Allergies:  Allergies  Allergen Reactions  . Shellfish-Derived Products Other (See Comments)    Medications: I have reviewed the patient's current medications.  Results for orders placed or performed during the hospital encounter of 05/12/2020 (from  the past 48 hour(s))  Lactic acid, plasma     Status: Abnormal   Collection Time: 05/04/2020 11:35 AM  Result Value Ref Range   Lactic Acid, Venous 6.6 (HH) 0.5 - 1.9 mmol/L    Comment: CRITICAL RESULT CALLED TO, READ BACK BY AND VERIFIED WITH: T.GLOSSON RN [id number] 05/15/2020 M[place]ORMICK K Performed at [facility] [address] [place], [place] [address]   Comprehensive metabolic panel     Status: Abnormal   Collection Time: 05/09/2020 11:35 AM  Result Value Ref Range   Sodium 134 (L) 135 - 145 mmol/L   Potassium 4.4 3.5 - 5.1 mmol/L   Chloride 96 (L) 98 - 111 mmol/L   CO2 17 (L) 22 - 32 mmol/L   Glucose, Bld 73 70 - 99 mg/dL    Comment: Glucose reference range applies only to samples taken after fasting for at least 8 hours.   BUN 87 (H) 8 - 23 mg/dL   Creatinine, Ser 2.37 (H) 0.61 - 1.24 mg/dL   Calcium 8.5 (L) 8.9 - 10.3 mg/dL   Total Protein 5.2 (L) 6.5 - 8.1 g/dL   Albumin 1.4 (L) 3.5 - 5.0 g/dL   AST 46 (H) 15 - 41 U/L   ALT 12 0 - 44 U/L   Alkaline Phosphatase 117 38 - 126 U/L   Total Bilirubin 2.4 (H) 0.3 - 1.2 mg/dL   GFR, Estimated 27 (L) >60 mL/min    Comment: (NOTE) Calculated using the CKD-EPI Creatinine Equation (2021)    Anion gap 21 (H) 5 - 15    Comment: Performed at [facility], [place] [address], [place],  [address]  CBC WITH DIFFERENTIAL     Status: Abnormal   Collection Time: 05/16/2020 11:35 AM  Result Value Ref Range   WBC 61.9 (HH) 4.0 - 10.5 K/uL    Comment: REPEATED TO VERIFY WHITE COUNT CONFIRMED ON SMEAR THIS CRITICAL RESULT HAS VERIFIED AND BEEN CALLED TO TORI GLOSSON RN BY ALLISON BENNETT ON 05 17 2022 AT 32, AND HAS BEEN READ BACK.     RBC 3.14 (L) 4.22 - 5.81 MIL/uL   Hemoglobin 8.8 (L) 13.0 - 17.0 g/dL   HCT 25.4 (L) 39.0 - 52.0 %   MCV 80.9 80.0 - 100.0 fL   MCH 28.0 26.0 - 34.0 pg   MCHC 34.6 30.0 - 36.0 g/dL   RDW 15.0 11.5 - 15.5 %   Platelets 417 (H) 150 - 400 K/uL   nRBC 0.0 0.0 - 0.2 %   Neutrophils Relative % 91 %    Neutro Abs 56.3 (H) 1.7 - 7.7 K/uL   Lymphocytes Relative 1 %   Lymphs Abs 0.6 (L) 0.7 - 4.0 K/uL   Monocytes Relative 4 %   Monocytes Absolute 2.4 (H) 0.1 - 1.0 K/uL   Eosinophils Relative 0 %   Eosinophils Absolute 0.0 0.0 - 0.5 K/uL   Basophils Relative 0 %   Basophils Absolute 0.0 0.0 - 0.1 K/uL   Immature Granulocytes 4 %   Abs Immature Granulocytes 2.56 (H) 0.00 - 0.07 K/uL  Comment: Performed at [facility], [place] [address], [place], [place] [address]  Protime-INR     Status: Abnormal   Collection Time: 05/18/2020 11:35 AM  Result Value Ref Range   Prothrombin Time 17.3 (H) 11.4 - 15.2 seconds   INR 1.4 (H) 0.8 - 1.2    Comment: (NOTE) INR goal varies based on device and disease states. Performed at [facility], [place] [address], [place], [place] [address]   APTT     Status: None   Collection Time: 06/03/2020 11:35 AM  Result Value Ref Range   aPTT 36 24 - 36 seconds    Comment: Performed at [facility] [address] [place], [place] [address]  Resp Panel by RT-PCR (Flu A&B, Covid) Nasopharyngeal Swab     Status: None   Collection Time: 05/09/2020 12:00 PM   Specimen: Nasopharyngeal Swab; Nasopharyngeal(NP) swabs in vial transport medium  Result Value Ref Range   SARS Coronavirus 2 by RT PCR NEGATIVE NEGATIVE    Comment: (NOTE) SARS-CoV-2 target nucleic acids are NOT DETECTED.  The SARS-CoV-2 RNA is generally detectable in upper respiratory specimens during the acute phase of infection. The lowest concentration of SARS-CoV-2 viral copies this assay can detect is 138 copies/mL. A negative result does not preclude SARS-Cov-2 infection and should not be used as the sole basis for treatment or other patient management decisions. A negative result may occur with  improper specimen collection/handling, submission of specimen other than nasopharyngeal swab, presence of viral mutation(s) within the areas targeted by this assay, and inadequate number of  viral copies(<138 copies/mL). A negative result must be combined with clinical observations, patient history, and epidemiological information. The expected result is Negative.  Fact Sheet for Patients:  EntrepreneurPulse.com.au  Fact Sheet for Healthcare Providers:  IncredibleEmployment.be  This test is no t yet approved or cleared by the [place] FDA and  has been authorized for detection and/or diagnosis of SARS-CoV-2 by FDA under an Emergency Use Authorization (EUA). This EUA will remain  in effect (meaning this test can be used) for the duration of the COVID-19 declaration under Section 564(b)(1) of the Act, 21 U.S.C.section 360bbb-3(b)(1), unless the authorization is terminated  or revoked sooner.       Influenza A by PCR NEGATIVE NEGATIVE   Influenza B by PCR NEGATIVE NEGATIVE    Comment: (NOTE) The Xpert Xpress SARS-CoV-2/FLU/RSV plus assay is intended as an aid in the diagnosis of influenza from Nasopharyngeal swab specimens and should not be used as a sole basis for treatment. Nasal washings and aspirates are unacceptable for Xpert Xpress SARS-CoV-2/FLU/RSV testing.  Fact Sheet for Patients: EntrepreneurPulse.com.au  Fact Sheet for Healthcare Providers: IncredibleEmployment.be  This test is not yet approved or cleared by the [place] FDA and has been authorized for detection and/or diagnosis of SARS-CoV-2 by FDA under an Emergency Use Authorization (EUA). This EUA will remain in effect (meaning this test can be used) for the duration of the COVID-19 declaration under Section 564(b)(1) of the Act, 21 U.S.C. section 360bbb-3(b)(1), unless the authorization is terminated or revoked.  Performed at [facility], [place] [address], [place], [place] [address]   Blood Culture (routine x 2)     Status: None (Preliminary result)   Collection Time: 06/03/2020 12:20 PM   Specimen: BLOOD  RIGHT ARM  Result Value Ref Range   Specimen Description BLOOD RIGHT ARM    Special Requests      BOTTLES DRAWN AEROBIC AND  ANAEROBIC Blood Culture adequate volume   Culture      NO GROWTH < 24 HOURS Performed at [facility] [address], [place], [place] [address]    Report Status PENDING   Blood Culture (routine x 2)     Status: None (Preliminary result)   Collection Time: 05/31/2020 12:25 PM   Specimen: BLOOD LEFT ARM  Result Value Ref Range   Specimen Description BLOOD LEFT ARM    Special Requests      BOTTLES DRAWN AEROBIC AND ANAEROBIC Blood Culture adequate volume   Culture      NO GROWTH < 24 HOURS Performed at [facility], [place] [address], [place], [place] [address]    Report Status PENDING   Urinalysis, Routine w reflex microscopic Urine, Catheterized     Status: Abnormal   Collection Time: 05/08/2020  1:45 PM  Result Value Ref Range   Color, Urine AMBER (A) YELLOW    Comment: BIOCHEMICALS MAY BE AFFECTED BY COLOR   APPearance TURBID (A) CLEAR   Specific Gravity, Urine 1.016 1.005 - 1.030   pH 5.0 5.0 - 8.0   Glucose, UA NEGATIVE NEGATIVE mg/dL   Hgb urine dipstick MODERATE (A) NEGATIVE   Bilirubin Urine NEGATIVE NEGATIVE   Ketones, ur NEGATIVE NEGATIVE mg/dL   Protein, ur 100 (A) NEGATIVE mg/dL   Nitrite NEGATIVE NEGATIVE   Leukocytes,Ua LARGE (A) NEGATIVE   RBC / HPF 11-20 0 - 5 RBC/hpf   WBC, UA >50 (H) 0 - 5 WBC/hpf   Bacteria, UA FEW (A) NONE SEEN   Squamous Epithelial / LPF 11-20 0 - 5   WBC Clumps PRESENT    Mucus PRESENT    Hyaline Casts, UA PRESENT    Non Squamous Epithelial 0-5 (A) NONE SEEN    Comment: Performed at [facility], [place] [address] [place], [place] [address]  Lactic acid, plasma     Status: Abnormal   Collection Time: 05/20/2020  2:22 PM  Result Value Ref Range   Lactic Acid, Venous 6.6 (HH) 0.5 - 1.9 mmol/L    Comment: CRITICAL VALUE NOTED.  VALUE IS CONSISTENT WITH PREVIOUSLY REPORTED AND CALLED VALUE. Performed at  [facility], [place] [address] [place], [place] [address]   Lactic acid, plasma     Status: Abnormal   Collection Time: 05/08/2020  6:36 PM  Result Value Ref Range   Lactic Acid, Venous 4.7 (HH) 0.5 - 1.9 mmol/L    Comment: CRITICAL VALUE NOTED.  VALUE IS CONSISTENT WITH PREVIOUSLY REPORTED AND CALLED VALUE. Performed at [facility], [place] [address] [place], [place] [address]   Lactic acid, plasma     Status: Abnormal   Collection Time: 05/14/2020  8:08 PM  Result Value Ref Range   Lactic Acid, Venous 4.5 (HH) 0.5 - 1.9 mmol/L    Comment: CRITICAL VALUE NOTED.  VALUE IS CONSISTENT WITH PREVIOUSLY REPORTED AND CALLED VALUE. Performed at [facility], [place] [address], [place], [place] [address]   MRSA PCR Screening     Status: None   Collection Time: 05/11/2020 11:32 PM   Specimen: Nasal Mucosa; Nasopharyngeal  Result Value Ref Range   MRSA by PCR NEGATIVE NEGATIVE    Comment:        The GeneXpert MRSA Assay (FDA approved for NASAL specimens only), is one component of a comprehensive MRSA colonization surveillance program. It is not intended to diagnose MRSA infection nor to guide or monitor treatment  for MRSA infections. Performed at [facility], [place] [address], [place], [place] [address]   Comprehensive metabolic panel     Status: Abnormal   Collection Time: 05/17/2020  1:20 AM  Result Value Ref Range   Sodium 135 135 - 145 mmol/L   Potassium 3.7 3.5 - 5.1 mmol/L   Chloride 102 98 - 111 mmol/L   CO2 18 (L) 22 - 32 mmol/L   Glucose, Bld 68 (L) 70 - 99 mg/dL    Comment: Glucose reference range applies only to samples taken after fasting for at least 8 hours.   BUN 89 (H) 8 - 23 mg/dL   Creatinine, Ser 2.05 (H) 0.61 - 1.24 mg/dL   Calcium 8.1 (L) 8.9 - 10.3 mg/dL   Total Protein 4.3 (L) 6.5 - 8.1 g/dL   Albumin 1.1 (L) 3.5 - 5.0 g/dL   AST 54 (H) 15 - 41 U/L   ALT 16 0 - 44 U/L   Alkaline Phosphatase 80 38 - 126 U/L   Total Bilirubin 2.7 (H) 0.3 - 1.2  mg/dL   GFR, Estimated 32 (L) >60 mL/min    Comment: (NOTE) Calculated using the CKD-EPI Creatinine Equation (2021)    Anion gap 15 5 - 15    Comment: Performed at [facility], [place] [address], [place], [place] [address]  CBC     Status: Abnormal   Collection Time: 05/28/2020  1:20 AM  Result Value Ref Range   WBC 43.4 (H) 4.0 - 10.5 K/uL   RBC 2.92 (L) 4.22 - 5.81 MIL/uL   Hemoglobin 8.2 (L) 13.0 - 17.0 g/dL   HCT 23.3 (L) 39.0 - 52.0 %   MCV 79.8 (L) 80.0 - 100.0 fL   MCH 28.1 26.0 - 34.0 pg   MCHC 35.2 30.0 - 36.0 g/dL   RDW 14.6 11.5 - 15.5 %   Platelets 359 150 - 400 K/uL   nRBC 0.0 0.0 - 0.2 %    Comment: Performed at [facility], [place] [address], [place], [place] [address]  Lactic acid, plasma     Status: Abnormal   Collection Time: 06/03/2020  1:20 AM  Result Value Ref Range   Lactic Acid, Venous 3.9 (HH) 0.5 - 1.9 mmol/L    Comment: CRITICAL VALUE NOTED.  VALUE IS CONSISTENT WITH PREVIOUSLY REPORTED AND CALLED VALUE. Performed at [facility], [place] [address] [place], [place] [address]     CT Knee Right Wo Contrast  Result Date: 05/05/2020 CLINICAL DATA:  Pain and swelling EXAM: CT OF THE right KNEE WITHOUT CONTRAST TECHNIQUE: Multidetector CT imaging of the right knee was performed according to the standard protocol. Multiplanar CT image reconstructions were also generated. COMPARISON:  None available FINDINGS: Bones/Joint/Cartilage Moderate to large knee effusion containing scattered foci of gas. No fracture or malalignment is visualized. Diffuse joint space calcifications. Moderate severe arthritis involving the medial tibiofemoral compartment with joint space narrowing, subarticular sclerosis and subarticular cysts. Question early erosive change along the medial joint margin, series 6, image 51, cortex slightly irregular and indistinct. Moderate arthritis of the lateral tibiofemoral joint space with prominent spurring. Ligaments Suboptimally assessed by CT.  Muscles and Tendons Quadriceps tendon grossly intact. Undulating appearance of the patellar ligament. Extensive gas collections within the visible portions of the vastus medialis muscle at the distal thigh with additional gas collections visualized in the medial vastus muscles as well. Cranial extent of gas is incompletely included. Soft tissues Extensive subcutaneous edema. Mild  rim enhancing gas and fluid collection along the medial aspect of the knee measuring approximately 5.1 cm AP by 1.7 cm transverse by 5.6 cm craniocaudad, suspicious for abscess. Coarse probable dystrophic calcifications in the prepatellar soft tissues. IMPRESSION: 1. Extensive gas collections within the medial and lateral vastus muscles of the distal thigh, concerning for necrotizing infection. Cranial extent of gas is incompletely visualized. Moderate to large knee effusion also containing gas bubbles, raising concern for joint space involvement of infection. Generalized edema and cellulitis about the knee with 5.6 cm mildly rim enhancing gas and fluid collection along the medial aspect of the knee concerning for soft tissue abscess. 2. Arthritis of the knee with chondrocalcinosis. Slightly irregular for joint space margin medial side of the knee, raising concern for osteomyelitis/septic arthritis given above findings, correlation with MRI is recommended. Critical Value/emergent results were called by telephone at the time of interpretation on 05/15/2020 at 11:20 pm to provider [facility] , who verbally acknowledged these results. Electronically Signed   By: Donavan Foil M.D.   On: 05/28/2020 23:20   DG Chest Port 1 View  Result Date: 06/01/2020 CLINICAL DATA:  Sepsis. EXAM: PORTABLE CHEST 1 VIEW COMPARISON:  June 27, 2018. FINDINGS: The heart size and mediastinal contours are within normal limits. No pneumothorax or pleural effusion is noted. Left lung is clear. Stable postsurgical changes and pleural thickening is seen involving  the right lung. No definite acute abnormality is noted. The visualized skeletal structures are unremarkable. IMPRESSION: No active disease. Electronically Signed   By: Marijo Conception M.D.   On: 05/19/2020 12:16   VAS [place] LOWER EXTREMITY VENOUS (DVT) (ONLY [facility] & [facility] 7a-7p)  Result Date: 05/31/2020  Lower Venous DVT Study Patient Name:  Jerry Wise  Date of Exam:   05/04/2020 Medical Rec #: [id number]      Accession #:    [id number] Date of Birth: [birth year]      Patient Gender: M Patient Age:   80Y Exam Location:  [facility] Procedure:      VAS [place] LOWER EXTREMITY VENOUS (DVT) Referring Phys: [id number] [facility] PATEL --------------------------------------------------------------------------------  Indications: Pain, Erythema, and Swelling.  Comparison Study: No prior studies. Performing Technologist: Darlin Coco RDMS,RVT  Examination Guidelines: A complete evaluation includes B-mode imaging, spectral Doppler, color Doppler, and power Doppler as needed of all accessible portions of each vessel. Bilateral testing is considered an integral part of a complete examination. Limited examinations for reoccurring indications may be performed as noted. The reflux portion of the exam is performed with the patient in reverse Trendelenburg.  +---------+---------------+---------+-----------+----------+--------------+ RIGHT    CompressibilityPhasicitySpontaneityPropertiesThrombus Aging +---------+---------------+---------+-----------+----------+--------------+ CFV      Full           Yes      Yes                                 +---------+---------------+---------+-----------+----------+--------------+ SFJ      Full                                                        +---------+---------------+---------+-----------+----------+--------------+ FV Prox  Full                                                        +---------+---------------+---------+-----------+----------+--------------+  FV Mid    Full                                                        +---------+---------------+---------+-----------+----------+--------------+ FV DistalFull                                                        +---------+---------------+---------+-----------+----------+--------------+ PFV      Full                                                        +---------+---------------+---------+-----------+----------+--------------+ POP      Full           Yes      Yes                                 +---------+---------------+---------+-----------+----------+--------------+ PTV      Full                                                        +---------+---------------+---------+-----------+----------+--------------+ PERO     Full                                                        +---------+---------------+---------+-----------+----------+--------------+   +----+---------------+---------+-----------+----------+--------------+ LEFTCompressibilityPhasicitySpontaneityPropertiesThrombus Aging +----+---------------+---------+-----------+----------+--------------+ CFV Full           Yes      Yes                                 +----+---------------+---------+-----------+----------+--------------+     Summary: RIGHT: - There is no evidence of deep vein thrombosis in the lower extremity.  - No cystic structure found in the popliteal fossa.  - Incidental: Large, irregular focal collection containing mixed echogenicities identified from the proximal to distal medial thigh.  LEFT: - No evidence of common femoral vein obstruction.  *See table(s) above for measurements and observations. Electronically signed by Deitra Mayo MD on 06/03/2020 at 3:45:30 PM.    Final     Review of Systems  Unable to perform ROS: Mental status change   Blood pressure (!) 101/45, pulse (!) 104, temperature 98.1 F (36.7 C), temperature source Oral, resp. rate (!) 22, height 6\' 2"  (1.88 m),  weight 65.3 kg, SpO2 100 %. Physical Exam Constitutional:      General: He is not in acute distress.    Appearance: He is well-developed. He is not diaphoretic.  HENT:     Head: Normocephalic and atraumatic.  Eyes:     General: No scleral icterus.  Right eye: No discharge.        Left eye: No discharge.     Conjunctiva/sclera: Conjunctivae normal.  Cardiovascular:     Rate and Rhythm: Normal rate and regular rhythm.  Pulmonary:     Effort: Pulmonary effort is normal. No respiratory distress.  Musculoskeletal:     Cervical back: Normal range of motion.     Comments: RLE No traumatic wounds or rash, ecchymotic about knee  Seemingly significant TTP thigh, copious SQE  Knee stable to varus/ valgus and anterior/posterior stress  Sens DPN, SPN, TN could not assess  Motor EHL, ext, flex, evers grossly intact  DP 0, PT 0, 3+ pitting edema  Skin:    General: Skin is warm and dry.  Neurological:     Mental Status: He is alert.  Psychiatric:        Behavior: Behavior normal.     Assessment/Plan: RLE infection -- Plan I&D with Dr. Lyla Glassing this afternoon. Please keep NPO. Multiple medical problems including tobacco use, lung cancer status post right upper lobectomy on surveillance, history of prostate cancer status post radioactive seed implants, chronic indwelling Foley catheter, COPD not on home oxygen, CKD stage IIIa, PVCs, hypertension, and gout -- per primary service    Lisette Abu, PA-C Orthopedic Surgery [phone] 05/19/2020, 8:49 AM

## 2020-05-21 NOTE — Progress Notes (Signed)
Initial Nutrition Assessment  DOCUMENTATION CODES:   Severe malnutrition in context of chronic illness,Underweight  INTERVENTION:   - RD will monitor for diet advancement and add oral nutrition supplements as appropriate  NUTRITION DIAGNOSIS:   Severe Malnutrition related to chronic illness (COPD, CKD stage IIIa) as evidenced by severe fat depletion,severe muscle depletion.  GOAL:   Patient will meet greater than or equal to 90% of their needs  MONITOR:   Diet advancement,Labs,Weight trends,Skin  REASON FOR ASSESSMENT:   Diagnosis,Other (underweight BMI)    ASSESSMENT:   80 year old male who presented to the ED on 5/17 with concern for L leg infection. PMH of HTN, GERD, COPD, lung cancer s/p R upper lobectomy in 2018, prostate cancer s/p radioactive seed implants, anemia, CKD stage IIIa, gout. Pt admitted with severe sepsis secondary to RLE necrotizing soft tissue infection.   Noted plan for I&D of RLE infection this afternoon by Orthopedics.  Spoke with pt's wife at bedside. She reports that prior to the last 1-2 weeks, pt had a fair to good appetite. Pt typically consumes 2 good meals daily (breakfast and lunch) and eats again at dinner but does not eat as much. Pt's wife shares that pt does drink 2 regular Ensure supplements daily.  Pt's wife shares that over the last 1-2 weeks, pt has had a decline in health as well as a decline in PO intake. Pt's wife has been unable to get pt to eat much food. For example, pt has consumed some egg salad, water, and ginger ale. Pt's wife shares that pt has continued to consume the Ensure supplements during this timeframe.  Pt's wife reports that pt's highest weight was 200 lbs and that this was about 10 years ago. She states that she first noticed that pt was losing weight about 2-3 years ago. Reviewed weight history in chart. Noted a steady decline in weight since February 2019. Weight has been trending back up since 02/19/20. Lowest weight  in chart was 61.2 kg on 02/19/20 and 03/07/20 and currently is 65.3 kg. However, pt with deep pitting edema to RLE so dry weight is uncertain.  Pt meets criteria for severe malnutrition. RD will follow along for diet advancement and order oral nutrition supplements as able.  Medications reviewed and include: IV abx  Labs reviewed: BUN 89, creatinine 2.05, albumin 1.1, lactic acid 3.9, hemoglobin 8.2  NUTRITION - FOCUSED PHYSICAL EXAM:  Flowsheet Row Most Recent Value  Orbital Region Severe depletion  Upper Arm Region Severe depletion  Thoracic and Lumbar Region Severe depletion  Buccal Region Severe depletion  Temple Region Severe depletion  Clavicle Bone Region Severe depletion  Clavicle and Acromion Bone Region Severe depletion  Scapular Bone Region Severe depletion  Dorsal Hand Severe depletion  Patellar Region Severe depletion  Anterior Thigh Region Severe depletion  Posterior Calf Region Severe depletion  Edema (RD Assessment) Severe  [RLE]  Hair Reviewed  Eyes Unable to assess  Mouth Unable to assess  Skin Reviewed  Nails Reviewed       Diet Order:   Diet Order            Diet NPO time specified  Diet effective now                 EDUCATION NEEDS:   Not appropriate for education at this time  Skin:  Skin Assessment: Skin Integrity Issues: DTI: right heel Other: skin tear to sacrum  Last BM:  05/16/2020 large type 6  Height:  Ht Readings from Last 1 Encounters:  06/01/2020 6\' 2"  (1.88 m)    Weight:   Wt Readings from Last 1 Encounters:  05/15/2020 65.3 kg    BMI:  Body mass index is 18.48 kg/m.  Estimated Nutritional Needs:   Kcal:  5945-8592  Protein:  80-95 grams  Fluid:  1.7-1.9 L    Gustavus Bryant, MS, RD, LDN Inpatient Clinical Dietitian Please see [facility] for contact information.

## 2020-05-21 NOTE — Progress Notes (Addendum)
NAME:  Jerry Wise, MRN:  [id number], DOB:  01/19/1940, LOS: 1 ADMISSION DATE:  05/25/2020, CONSULTATION DATE:  05/26/2020 REFERRING MD:  EDP CHIEF COMPLAINT:   L leg infection  History of Present Illness:  Jerry Wise is a 80 y.o. M with PMH significant for COPD not on home O2, CKD, Gout, prostate Ca, RUL neoplasm under surveillance, HTN who started developing a swollen R knee approximately one week ago.   Denies any trauma, cuts or abrasions.  He developed worsening swelling and redness and was evaluated by PCP where SBP was in the 70's, so referred to the ED.  He denies any fevers or chills, but has had generalized malaise and poor appetite.  He was on Levaquin two weeks ago for UTI.     In the ED, blood pressure improved with IVF, he was given Vancomycin and Cefepime and labs revealed WBC of 61k, lactic acid 6.6, creatinine 2.3 (baseline 1.4-1.6), bedside US with large fluid collection.  ED provider spoke with ortho who opted not to perform arthrocentesis secondary to overlying cellulitis.  MRI of the knee ordered.  PCCM consulted for hypotension.  At the time of evaluation, pt awake and interactive with MAP of 66 after ~2.5L IVF.  5/18 PCCM reconsulted after pt operative course in which pt had significant excision/debridement of RLE nec fasc, and remains intubated and on pressors following case.   Pertinent  Medical History    has a past medical history of Asthma, Cancer ([place]), Chronic renal insufficiency, Dyspnea, GERD (gastroesophageal reflux disease), Gout, Gouty arthritis, History of kidney stones, History of prostate cancer, Hydronephrosis, bilateral, Hypertension, Parathyroid abnormality ([place]), Pernicious anemia (FOLLOWED BY DR WEBB), and Urethral stricture.   Significant Hospital Events: Including procedures, antibiotic start and stop dates in addition to other pertinent events   . 5/17 Presented to ED, treated for sepsis with IVF, abx, BP improved.  PCCM consulted and recommend admission  to the floor . 5/18 OR with ortho for R knee I&D. Converted to substantial debridement of nec fasc. On pressors, to ICU intubated following case   Interim History / Subjective:  Extensive operative case for nec fasc. EBL 2L (though significant amount noted to be pus) Hgb 5.8 after 1 PRBC   On pressors  Intubated Metabolic acidosis   Objective   Blood pressure (!) 106/40, pulse (!) 108, temperature 98.4 F (36.9 C), temperature source Oral, resp. rate (!) 28, height 6\' 2"  (1.88 m), weight 65.3 kg, SpO2 96 %.        Intake/Output Summary (Last 24 hours) at 05/15/2020 1800 Last data filed at 05/13/2020 1713 Gross per 24 hour  Intake 3434.34 ml  Output 2800 ml  Net 634.34 ml   Filed Weights   05/06/2020 1159 05/12/2020 2324 05/27/2020 1249  Weight: 61.2 kg 65.3 kg 65.3 kg   General:   Cachectic, frail, chronically ill appearing elderly M intubated sedated  HEENT: NCAT temporal muscle wasting ETT secure pink mm  Neuro: sedated no response to painful stimuli  CV: rrr s1s2 cap refill < 3sec  PULM:  CTAb symmetrical chest expansion mechanically ventilated  WU:XLKG, soft + bowel sounds  GU: foley with amber urine  Extremities: RLE dressed and immobilized. No edema Skin: pale, distal cyanosis   Labs/imaging that I havepersonally reviewed  (right click and "Reselect all SmartList Selections" daily)   ABG 7.1/43/276 K 5.4 iCal 0/97 Cr 2 BUN 96 Hgb 5.8 (after 1 unit)  WBC 43 LA 3.9  Resolved Hospital Problem list  Assessment & Plan:    GOC -extensive nec fasc with non-salvageable RLE as detailed below with associated septic shock -chronically unwell pt, very frail appearing prior to OR -prognosis very guarded -- If aggressive measures were to be pursued pt would need a hip disarticulation (would need transfer to OSH to do this currently) but moreover doubt that pt is a good candidate for such interventions -Will discuss GOC with family. Currently Full Code   Septic  shock Septic arthritis R knee Necrotizing fasciits RLE, including R hip and R knee joints  Non-salvageable limb RLE  -5/18 POD 0 extensive debridement of RLE. Majority of all soft tissue removed. Infection had eroded through portion of superficial fem artery requiring suture by VVS  -not appropriate for AKA, would require hip disarticulation -EBL intra op 2L however in d/w   P -zyvox zosyn -pressors for MAP > 65 + blood resuscitation  -appreciate CVC and art line placement by anethes. -needs GOC   Acute respiratory failure, remaining intubated after surgery COPD - followed by Dr. Lamonte Sakai  P -CXR in ICU, ABG -- incr RR and decr FiO2 follow up gas in 1hr  -full MV support  -PAD, VAP -- prop and fent -duonebs, albuterol (home med stiolto and albuterol)  -Weaning vent deferred until [place] established   CKD III -baseline cr 2 P -trend renal indices  Metabolic Acidosis  -1amp bicarb intraop Lactic acidosis  -in setting of septic shock  -no great utility in trending LA anticipate will be high.  -bicarb gtt    Pernicious anemia  -preop hgb 8 ABLA -Post op hgb 5 -- got 1 PRBC intra op, 2FFP, 1plt  P  -3 PRBC pending   Hypocalcemia -ical 0.9 P -replace   HTN  -holding antihypertensives in setting of shock  GERD -PPI as per VAP   Lung cancer- follows with Dr Roxan Hockey  Prostate Cancer s/p radioactive seed P -refused oncology referral for lung ca  Hx depression -home paxil   Protein calorie malnutrition, severe P -RDN consult 5/19 if aggressive care continued   Best practice    PAD - fent prop RASS -2  Vent- yes GI ppx- PPI Glu- no SSI  VTE- hold in setting of surgery  Code status: Full Family communication: 5/18 has not yet been updated by ortho surgery -- GOC pending  Dispo: direct to ICU after OR   Labs   CBC: Recent Labs  Lab 05/29/2020 1135 05/04/2020 0120  WBC 61.9* 43.4*  NEUTROABS 56.3*  --   HGB 8.8* 8.2*  HCT 25.4* 23.3*  MCV 80.9 79.8*   PLT 417* 161    Basic Metabolic Panel: Recent Labs  Lab 05/31/2020 1135 05/17/2020 0120  NA 134* 135  K 4.4 3.7  CL 96* 102  CO2 17* 18*  GLUCOSE 73 68*  BUN 87* 89*  CREATININE 2.37* 2.05*  CALCIUM 8.5* 8.1*   GFR: Estimated Creatinine Clearance: 26.5 mL/min (A) (by C-G formula based on SCr of 2.05 mg/dL (H)). Recent Labs  Lab 05/19/2020 1135 05/28/2020 1422 05/06/2020 1836 05/06/2020 2008 05/09/2020 0120  WBC 61.9*  --   --   --  43.4*  LATICACIDVEN 6.6* 6.6* 4.7* 4.5* 3.9*    Liver Function Tests: Recent Labs  Lab 05/04/2020 1135 05/09/2020 0120  AST 46* 54*  ALT 12 16  ALKPHOS 117 80  BILITOT 2.4* 2.7*  PROT 5.2* 4.3*  ALBUMIN 1.4* 1.1*   No results for input(s): LIPASE, AMYLASE in the last 168 hours. No results  for input(s): AMMONIA in the last 168 hours.  ABG    Component Value Date/Time   PHART 7.391 03/22/2017 0656   PCO2ART 30.6 (L) 03/22/2017 0656   PO2ART 77.0 (L) 03/22/2017 0656   HCO3 18.6 (L) 03/22/2017 0656   TCO2 20 (L) 03/22/2017 0656   ACIDBASEDEF 6.0 (H) 03/22/2017 0656   O2SAT 96.0 03/22/2017 0656     Coagulation Profile: Recent Labs  Lab 05/05/2020 1135  INR 1.4*    Cardiac Enzymes: No results for input(s): CKTOTAL, CKMB, CKMBINDEX, TROPONINI in the last 168 hours.  HbA1C: No results found for: HGBA1C  CBG: Recent Labs  Lab 05/08/2020 1210 05/04/2020 1232  GLUCAP 48* 123*    CRITICAL CARE Performed by: Cristal Generous   Total critical care time: 63 minutes  Critical care time was exclusive of separately billable procedures and treating other patients. Critical care was necessary to treat or prevent imminent or life-threatening deterioration.  Critical care was time spent personally by me on the following activities: development of treatment plan with patient and/or surrogate as well as nursing, discussions with consultants, evaluation of patient's response to treatment, examination of patient, obtaining history from patient or  surrogate, ordering and performing treatments and interventions, ordering and review of laboratory studies, ordering and review of radiographic studies, pulse oximetry and re-evaluation of patient's condition.  Eliseo Gum MSN, AGACNP-BC [place] for pager details 05/23/2020, 6:04 PM

## 2020-05-21 NOTE — Anesthesia Procedure Notes (Signed)
Arterial Line Insertion Start/End05/09/2020 5:03 PM, 05/16/2020 5:10 PM Performed by: Annye Asa, MD, anesthesiologist  Patient location: OR. Preanesthetic checklist: patient identified, IV checked, risks and benefits discussed, surgical consent, monitors and equipment checked, pre-op evaluation, timeout performed and anesthesia consent Left, radial was placed Catheter size: 20 G Hand hygiene performed  and Seldinger technique used Allen's test indicative of satisfactory collateral circulation Attempts: 2 Procedure performed using ultrasound guided technique. Ultrasound Notes:anatomy identified, needle tip was noted to be adjacent to the nerve/plexus identified, no ultrasound evidence of intravascular and/or intraneural injection and image(s) printed for medical record Following insertion, dressing applied and Biopatch. Post procedure assessment: normal  Patient tolerated the procedure well with no immediate complications.

## 2020-05-21 NOTE — Op Note (Signed)
OPERATIVE REPORT   05/09/2020  5:28 PM  PATIENT:  Jerry Wise   SURGEON:  Bertram Savin, MD  ASSISTANT:  Cherlynn June, PA-C. Curt Jews, PA-C   PREOPERATIVE DIAGNOSIS:  Septic arthritis right knee. Right thigh abscess.  POSTOPERATIVE DIAGNOSIS:  Septic arthritis right knee. 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   PROCEDURE:  Excisional debridement right lower extremity including subcutaneous tissue, synovium, fascia, muscle, and periosteum.  ANESTHESIA:   GETA.  ANTIBIOTICS:  Receiving scheduled IV Linezolid and Zosyn.  IMPLANTS:  None.  SPECIMENS:  Right knee purulent fluid for culture. Right knee necrotic synovium for aerobic, anaerobic, fungal, and AFB cultures.  COMPLICATIONS:  None.  DISPOSITION:  Intubated to ICU.  SURGICAL INDICATIONS:  Jerry Wise is a 80 y.o. male with multiple medical problems including catheter associated urinary tract infection, gout, depression, COPD, white blood cell count of 61,900 on admission, acute kidney injury on CKD stage IIIa who has been treated as an outpatient at his primary care provider for lower extremity cellulitis.  His condition worsened and he was sent to the emergency department yesterday.  CT scan was obtained of the knee showing a large effusion and an abscess within the VMO with gas throughout the distal thigh both subcutaneously and along the muscles.  Aspiration of the right knee was performed, yielding gross purulent fluid. He was indicated for open arthrotomy and debridement of the right knee.  I discussed the risk, benefits, and alternatives with the patient's wife, and she elected to proceed with surgery.  PROCEDURE IN DETAIL: The patient was identified in the holding area using 2 identifiers.  The surgical site was marked by myself.  He was taken the operating room, and placed supine on the operating room table.  General anesthesia was obtained.  The right lower extremity was prepped and  draped in the normal sterile surgical fashion.  Timeout was called, verifying site and site of surgery.  Tourniquet was not utilized.  He is already receiving scheduled IV antibiotics as an inpatient.  I examined his right thigh.  He had extensive cellulitis over the medial aspect of the distal femur with fluctuance over the VMO.  I made a longitudinal incision between the medial border of the patella and the medial epicondyle.  Full-thickness skin flaps were created.  Upon incising into the subcutaneous tissues, there was copious purulent fluid encountered.  A sample of the fluid was sent for culture.  I then raised skin flaps over the extensor mechanism and over the medial aspect of the thigh.  There was a large defect in the VMO muscle with extensive necrosis to the VMO.  There was necrotic material in the prepatellar bursa which I excisionally debrided with Mayo scissors.  I then created a standard medial parapatellar arthrotomy.  Within the knee joint, there was foul-smelling fluid.  There was necrotic synovial tissue proximally over the distal femur.  I excisionally debrided all necrotic synovial tissue including the suprapatellar pouch and medial gutter with Mayo scissors and a rongeur.  At this point, I searched for the most proximal aspect of infection.  Using blunt digital dissection, it was obvious that the entirety of the quadricep muscles contained necrotic tissue and purulent fluid along the fascial planes.  I excisionally debrided all necrotic muscle with a rongeur and curved Mayo scissors.  The most superficial layer of the quadriceps adherent to the quadricep tendon was viable and contracted when stimulated with Bovie electrocautery.  All of the deeper layers  were necrotic and were excisionally debrided as above.  Proceeding laterally I debrided all muscle to the intermuscular septum.  Proceeding medially I encountered the superficial femoral artery.  The infection had eroded through a branch of  the superficial femoral artery.  At this point, I called Dr. Donnetta Hutching with vascular surgery for an intraoperative consult.  He repaired the arterial branch with a single suture.  Proximally, I used a Cobb elevator to elevate the periosteum of the femur all the way up to the origin of the vastus lateralis.  The periosteal tissue was clearly necrotic, and therefore I excisionally debrided it with curved Mayo scissors.  Posteriorly, the infection tracked into the hamstrings up to the level of the ischial tuberosity.  Again, I excisionally debrided all necrotic hamstring muscle tissue with a rongeur.  I once again examined the wound.  In the deep wound bed, the sciatic nerve was visible.  Superficial to the sciatic nerve, the superficial femoral artery was visible.  Once I was satisfied that all necrotic material had been excisionally debrided, the wound was irrigated with 12 L of normal saline using cystoscopy tubing.  The wound was then packed with a total of 4 rolls of Betadine soaked Kerlix gauze.  I tacked the skin together very loosely with 2-0 nylon sutures.  Sterile dressing was then applied with ABD pads, sterile cast padding, and an Ace wrap.  Sponge, needle, and instrument counts were correct at the end of the case x2.  Anesthesia elected to leave the patient intubated, for direct transfer to the ICU.   Debridement type: Excisional Debridement  Side: right  Body Location: Right lower extremity   Tools used for debridement: scissors and rongeur  Pre-debridement Wound size (cm):   Length: N/A        Width: N/A     Depth: N/A   Post-debridement Wound size (cm):   Length: 55 cm        Width: N/A     Depth: 7 cm   Debridement depth beyond dead/damaged tissue down to healthy viable tissue: yes  Tissue layer involved: skin, subcutaneous tissue, muscle / fascia  Nature of tissue removed: Necrotic and Purulence  Irrigation volume: 12 L     Irrigation fluid type: Normal Saline  POSTOPERATIVE PLAN:  Postoperatively, the patient will be admitted to the critical care service. Due to the extensive nature of his infection, the lower extremity is not salvageable.  The 2 options at this point are palliative care consult versus hip disarticulation.  I do not think this is a survivable situation for the patient, and I do not think he would survive a hip disarticulation.  He will need a repeat debridement in the next couple of days pending the family's decision.  Continue IV antibiotics for now.  Monitor intraoperative cultures.I did discuss all of these details at length with his daughter.

## 2020-05-21 NOTE — Op Note (Addendum)
Patient was seen his intraoperative consult with Dr. Lyla Glassing.  The patient presented with extensive necrotizing fasciitis throughout his thigh up into including his hip and knee joints.  He had a great deal dead muscle and this was all debrided.  The superficial femoral artery was exposed throughout its course due to the necrotizing fasciitis.  There was a small area of branch that had been bleeding.  I was consulted to see if there was treatment required.  This had stopped.  The area appeared to be a disrupted small branch involved with a necrotic area.  I recommended placing a suture to reduce risk for rebleeding.  I placed a 5 oh figure-of-eight Prolene suture at this area on the artery.    The patient does not have a viable leg.  I do not feel he would be able to heal an above-knee amputation and would require hip disarticulation.  I think that he has a very high risk of mortality with this procedure and would recommend discussing with palliative care team   The remainder the procedure will be dictated by Dr. Lyla Glassing

## 2020-05-21 NOTE — Anesthesia Procedure Notes (Signed)
Procedure Name: LMA Insertion Date/Time: 05/05/2020 2:51 PM Performed by: Candis Shine, CRNA Pre-anesthesia Checklist: Patient identified, Emergency Drugs available, Suction available and Patient being monitored Patient Re-evaluated:Patient Re-evaluated prior to induction Oxygen Delivery Method: Circle System Utilized Preoxygenation: Pre-oxygenation with 100% oxygen Induction Type: IV induction LMA: LMA inserted LMA Size: 4.0 Number of attempts: 1 Placement Confirmation: positive ETCO2 Tube secured with: Tape Dental Injury: Teeth and Oropharynx as per pre-operative assessment

## 2020-05-22 ENCOUNTER — Encounter (HOSPITAL_COMMUNITY): Payer: Self-pay | Admitting: Orthopedic Surgery

## 2020-05-22 DIAGNOSIS — M7989 Other specified soft tissue disorders: Secondary | ICD-10-CM | POA: Diagnosis not present

## 2020-05-22 DIAGNOSIS — J9601 Acute respiratory failure with hypoxia: Secondary | ICD-10-CM | POA: Diagnosis not present

## 2020-05-22 DIAGNOSIS — Z9911 Dependence on respirator [ventilator] status: Secondary | ICD-10-CM | POA: Diagnosis not present

## 2020-05-22 DIAGNOSIS — A419 Sepsis, unspecified organism: Secondary | ICD-10-CM | POA: Diagnosis not present

## 2020-05-22 DIAGNOSIS — J9600 Acute respiratory failure, unspecified whether with hypoxia or hypercapnia: Secondary | ICD-10-CM

## 2020-05-22 DIAGNOSIS — D62 Acute posthemorrhagic anemia: Secondary | ICD-10-CM

## 2020-05-22 LAB — CBC
HCT: 31.5 % — ABNORMAL LOW (ref 39.0–52.0)
Hemoglobin: 11.5 g/dL — ABNORMAL LOW (ref 13.0–17.0)
MCH: 28.8 pg (ref 26.0–34.0)
MCHC: 36.5 g/dL — ABNORMAL HIGH (ref 30.0–36.0)
MCV: 78.9 fL — ABNORMAL LOW (ref 80.0–100.0)
Platelets: 138 10*3/uL — ABNORMAL LOW (ref 150–400)
RBC: 3.99 MIL/uL — ABNORMAL LOW (ref 4.22–5.81)
RDW: 15.5 % (ref 11.5–15.5)
WBC: 31.7 10*3/uL — ABNORMAL HIGH (ref 4.0–10.5)
nRBC: 0 % (ref 0.0–0.2)

## 2020-05-22 LAB — CBC WITH DIFFERENTIAL/PLATELET
Abs Immature Granulocytes: 0 10*3/uL (ref 0.00–0.07)
Basophils Absolute: 0 10*3/uL (ref 0.0–0.1)
Basophils Relative: 0 %
Eosinophils Absolute: 0.2 10*3/uL (ref 0.0–0.5)
Eosinophils Relative: 1 %
HCT: 29.1 % — ABNORMAL LOW (ref 39.0–52.0)
Hemoglobin: 10.5 g/dL — ABNORMAL LOW (ref 13.0–17.0)
Lymphocytes Relative: 4 %
Lymphs Abs: 1 10*3/uL (ref 0.7–4.0)
MCH: 28.8 pg (ref 26.0–34.0)
MCHC: 36.1 g/dL — ABNORMAL HIGH (ref 30.0–36.0)
MCV: 79.9 fL — ABNORMAL LOW (ref 80.0–100.0)
Monocytes Absolute: 1 10*3/uL (ref 0.1–1.0)
Monocytes Relative: 4 %
Neutro Abs: 22.2 10*3/uL — ABNORMAL HIGH (ref 1.7–7.7)
Neutrophils Relative %: 91 %
Platelets: 143 10*3/uL — ABNORMAL LOW (ref 150–400)
RBC: 3.64 MIL/uL — ABNORMAL LOW (ref 4.22–5.81)
RDW: 15.4 % (ref 11.5–15.5)
WBC: 24.4 10*3/uL — ABNORMAL HIGH (ref 4.0–10.5)
nRBC: 0 % (ref 0.0–0.2)
nRBC: 0 /100 WBC

## 2020-05-22 LAB — PREPARE PLATELET PHERESIS: Unit division: 0

## 2020-05-22 LAB — POCT I-STAT 7, (LYTES, BLD GAS, ICA,H+H)
Acid-base deficit: 2 mmol/L (ref 0.0–2.0)
Acid-base deficit: 3 mmol/L — ABNORMAL HIGH (ref 0.0–2.0)
Bicarbonate: 18.5 mmol/L — ABNORMAL LOW (ref 20.0–28.0)
Bicarbonate: 19.7 mmol/L — ABNORMAL LOW (ref 20.0–28.0)
Calcium, Ion: 0.77 mmol/L — CL (ref 1.15–1.40)
Calcium, Ion: 0.82 mmol/L — CL (ref 1.15–1.40)
HCT: 29 % — ABNORMAL LOW (ref 39.0–52.0)
HCT: 32 % — ABNORMAL LOW (ref 39.0–52.0)
Hemoglobin: 10.9 g/dL — ABNORMAL LOW (ref 13.0–17.0)
Hemoglobin: 9.9 g/dL — ABNORMAL LOW (ref 13.0–17.0)
O2 Saturation: 100 %
O2 Saturation: 100 %
Patient temperature: 36.5
Patient temperature: 36.8
Potassium: 3.5 mmol/L (ref 3.5–5.1)
Potassium: 3.5 mmol/L (ref 3.5–5.1)
Sodium: 137 mmol/L (ref 135–145)
Sodium: 138 mmol/L (ref 135–145)
TCO2: 19 mmol/L — ABNORMAL LOW (ref 22–32)
TCO2: 20 mmol/L — ABNORMAL LOW (ref 22–32)
pCO2 arterial: 21.8 mmHg — ABNORMAL LOW (ref 32.0–48.0)
pCO2 arterial: 23 mmHg — ABNORMAL LOW (ref 32.0–48.0)
pH, Arterial: 7.534 — ABNORMAL HIGH (ref 7.350–7.450)
pH, Arterial: 7.539 — ABNORMAL HIGH (ref 7.350–7.450)
pO2, Arterial: 188 mmHg — ABNORMAL HIGH (ref 83.0–108.0)
pO2, Arterial: 195 mmHg — ABNORMAL HIGH (ref 83.0–108.0)

## 2020-05-22 LAB — COMPREHENSIVE METABOLIC PANEL
ALT: 19 U/L (ref 0–44)
AST: 90 U/L — ABNORMAL HIGH (ref 15–41)
Albumin: 1.4 g/dL — ABNORMAL LOW (ref 3.5–5.0)
Alkaline Phosphatase: 45 U/L (ref 38–126)
Anion gap: 20 — ABNORMAL HIGH (ref 5–15)
BUN: 82 mg/dL — ABNORMAL HIGH (ref 8–23)
CO2: 15 mmol/L — ABNORMAL LOW (ref 22–32)
Calcium: 7 mg/dL — ABNORMAL LOW (ref 8.9–10.3)
Chloride: 102 mmol/L (ref 98–111)
Creatinine, Ser: 1.96 mg/dL — ABNORMAL HIGH (ref 0.61–1.24)
GFR, Estimated: 34 mL/min — ABNORMAL LOW (ref >60)
Glucose, Bld: 128 mg/dL — ABNORMAL HIGH (ref 70–99)
Potassium: 3.6 mmol/L (ref 3.5–5.1)
Sodium: 137 mmol/L (ref 135–145)
Total Bilirubin: 5.7 mg/dL — ABNORMAL HIGH (ref 0.3–1.2)
Total Protein: 3.8 g/dL — ABNORMAL LOW (ref 6.5–8.1)

## 2020-05-22 LAB — BASIC METABOLIC PANEL
Anion gap: 19 — ABNORMAL HIGH (ref 5–15)
BUN: 84 mg/dL — ABNORMAL HIGH (ref 8–23)
CO2: 18 mmol/L — ABNORMAL LOW (ref 22–32)
Calcium: 7.1 mg/dL — ABNORMAL LOW (ref 8.9–10.3)
Chloride: 100 mmol/L (ref 98–111)
Creatinine, Ser: 1.88 mg/dL — ABNORMAL HIGH (ref 0.61–1.24)
GFR, Estimated: 36 mL/min — ABNORMAL LOW (ref >60)
Glucose, Bld: 130 mg/dL — ABNORMAL HIGH (ref 70–99)
Potassium: 3.6 mmol/L (ref 3.5–5.1)
Sodium: 137 mmol/L (ref 135–145)

## 2020-05-22 LAB — PREPARE FRESH FROZEN PLASMA

## 2020-05-22 LAB — BPAM FFP
Blood Product Expiration Date: 202205232359
Blood Product Expiration Date: 202205232359
ISSUE DATE / TIME: 202205181828
ISSUE DATE / TIME: 202205181828
Unit Type and Rh: 5100
Unit Type and Rh: 6200

## 2020-05-22 LAB — GLUCOSE, CAPILLARY
Glucose-Capillary: 143 mg/dL — ABNORMAL HIGH (ref 70–99)
Glucose-Capillary: 142 mg/dL — ABNORMAL HIGH (ref 70–99)
Glucose-Capillary: 132 mg/dL — ABNORMAL HIGH (ref 70–99)

## 2020-05-22 LAB — ACID FAST SMEAR (AFB, MYCOBACTERIA): Acid Fast Smear: NEGATIVE

## 2020-05-22 LAB — BPAM PLATELET PHERESIS
Blood Product Expiration Date: 202205202359
ISSUE DATE / TIME: 202205181824
Unit Type and Rh: 5100

## 2020-05-22 LAB — LACTIC ACID, PLASMA: Lactic Acid, Venous: 6.8 mmol/L (ref 0.5–1.9)

## 2020-05-22 LAB — TRIGLYCERIDES: Triglycerides: 174 mg/dL — ABNORMAL HIGH (ref <150)

## 2020-05-22 MED ORDER — STERILE WATER FOR INJECTION IV SOLN
INTRAVENOUS | Status: AC
  Filled 2020-05-22: qty 1000

## 2020-05-22 MED ORDER — ALLOPURINOL 100 MG PO TABS
100.0000 mg | ORAL_TABLET | Freq: Every day | ORAL | Status: DC
  Administered 2020-05-22 – 2020-05-23 (×2): 100 mg via ORAL
  Filled 2020-05-22 (×2): qty 1

## 2020-05-22 MED ORDER — VITAL HIGH PROTEIN PO LIQD: 1000.0000 mL | ORAL | Status: DC

## 2020-05-22 MED ORDER — SODIUM CHLORIDE 0.9 % IV SOLN
3.0000 g | Freq: Once | INTRAVENOUS | Status: AC
  Administered 2020-05-22: 3 g via INTRAVENOUS
  Filled 2020-05-22: qty 30

## 2020-05-22 MED ORDER — PIVOT 1.5 CAL PO LIQD
1000.0000 mL | ORAL | Status: DC
  Administered 2020-05-22: 1000 mL

## 2020-05-22 MED ORDER — PROSOURCE TF PO LIQD
45.0000 mL | Freq: Two times a day (BID) | ORAL | Status: DC
  Administered 2020-05-22 – 2020-05-23 (×3): 45 mL
  Filled 2020-05-22 (×3): qty 45

## 2020-05-22 NOTE — Progress Notes (Signed)
Chaplain notified that pt status was being changed to comfort care. Chaplain spent time with pt's RN, Carloyn Manner who informed chaplain that pt is not currently responsive beyond being responsive to pain. RN shared that pt's daughter, Fain Francis [phone]) would like her son Lu Duffel to be able to visit with his grandfather before he dies. Pt's grandson is currently incarcerated and unable to connect remotely, so they are requesting help from spiritual care to facilitate a compassionate release for him to visit. Chaplain consulted with Palliative Chaplain Dorian Pod who confirmed pt has been added to the palliative care service and she will follow up.  Please page as further needs arise.  Donald Prose. Elyn Peers, M.Div. [facility] Chaplain Pager [phone] Office [phone]

## 2020-05-22 NOTE — Anesthesia Postprocedure Evaluation (Signed)
Anesthesia Post Note  Patient: Jerry Wise  Procedure(s) Performed: IRRIGATION AND DEBRIDEMENT RIGHT LEG (Right )     Patient location during evaluation: SICU Anesthesia Type: General Level of consciousness: sedated and patient remains intubated per anesthesia plan Pain management: pain level controlled Vital Signs Assessment: post-procedure vital signs reviewed and stable Respiratory status: patient remains intubated per anesthesia plan Cardiovascular status: stable Postop Assessment: no apparent nausea or vomiting Anesthetic complications: no   No complications documented.  Last Vitals:  Vitals:   05/22/20 0645 05/22/20 0700  BP:    Pulse: (!) 113 (!) 113  Resp: (!) 25 (!) 25  Temp: 36.9 C 36.8 C  SpO2: 100% 100%    Last Pain:  Vitals:   05/22/20 0400  TempSrc: Esophageal  PainSc:                  Barnet Glasgow

## 2020-05-22 NOTE — Addendum Note (Signed)
Addendum  created 05/22/20 0717 by Barnet Glasgow, MD   Clinical Note Signed

## 2020-05-22 NOTE — Progress Notes (Signed)
NAME:  Jerry Wise, MRN:  [id number], DOB:  1940-07-31, LOS: 2 ADMISSION DATE:  05/27/2020, CONSULTATION DATE:  05/22/20 REFERRING MD:  EDP CHIEF COMPLAINT:   L leg infection  History of Present Illness:  Jerry Wise is a 80 y.o. M with PMH significant for COPD not on home O2, CKD, Gout, prostate Ca, RUL neoplasm under surveillance, HTN who started developing a swollen R knee approximately one week ago.   Denies any trauma, cuts or abrasions.  He developed worsening swelling and redness and was evaluated by PCP where SBP was in the 70's, so referred to the ED.  He denies any fevers or chills, but has had generalized malaise and poor appetite.  He was on Levaquin two weeks ago for UTI.     In the ED, blood pressure improved with IVF, he was given Vancomycin and Cefepime and labs revealed WBC of 61k, lactic acid 6.6, creatinine 2.3 (baseline 1.4-1.6), bedside US with large fluid collection.  ED provider spoke with ortho who opted not to perform arthrocentesis secondary to overlying cellulitis.  MRI of the knee ordered.  PCCM consulted for hypotension.  At the time of evaluation, pt awake and interactive with MAP of 66 after ~2.5L IVF.  5/18 PCCM reconsulted after pt operative course in which pt had significant excision/debridement of RLE nec fasc, and remains intubated and on pressors following case.   Pertinent  Medical History    has a past medical history of Asthma, Cancer ([place]), Chronic renal insufficiency, Dyspnea, GERD (gastroesophageal reflux disease), Gout, Gouty arthritis, History of kidney stones, History of prostate cancer, Hydronephrosis, bilateral, Hypertension, Parathyroid abnormality ([place]), Pernicious anemia (FOLLOWED BY DR WEBB), and Urethral stricture.   Significant Hospital Events: Including procedures, antibiotic start and stop dates in addition to other pertinent events   . 5/17 Presented to ED, treated for sepsis with IVF, abx, BP improved.  PCCM consulted and recommend admission  to the floor . 5/18 OR with ortho for R knee I&D. Converted to substantial debridement of nec fasc. On pressors, to ICU intubated following case  . 5/19 Family updated later in the evening and decision made for DNR status   Interim History / Subjective:  No acute issues overnight Remains on the vent  Urine output low ~ 590 mls in the last 24hrs   Objective   Blood pressure (!) 153/49, pulse (!) 113, temperature 98.24 F (36.8 C), resp. rate (!) 25, height 6\' 2"  (1.88 m), weight 65.3 kg, SpO2 100 %.    Vent Mode: PRVC FiO2 (%):  [35 %-100 %] 35 % Set Rate:  [20 bmp-30 bmp] 20 bmp Vt Set:  [650 mL] 650 mL PEEP:  [5 cmH20-10 cmH20] 8 cmH20 Plateau Pressure:  [16 cmH20-25 cmH20] 22 cmH20   Intake/Output Summary (Last 24 hours) at 05/22/2020 0821 Last data filed at 05/22/2020 0700 Gross per 24 hour  Intake 6187.1 ml  Output 3140 ml  Net 3047.1 ml   Filed Weights   05/05/2020 1159 05/18/2020 2324 05/04/2020 1249  Weight: 61.2 kg 65.3 kg 65.3 kg    General: Acute on chronic ill appearing cachetic appearing elderly male lying in bed on mechanical ventilation, in NAD HEENT: ETT, MM pink/moist, PERRL,  Neuro: Will open eyes to painful stimuli, sedated on vent, unable to follow commands  CV: s1s2 regular rate and rhythm, no murmur, rubs, or gallops,  PULM:  Clear to ascultation, ribs outlines visible on chest, no increased work of breathing, tolerating vent well  GI: soft,  bowel sounds hypoactive in all 4 quadrants, non-tender, non-distended Extremities: warm/dry, no edema, Left DP unable to be dopplered, Left PT dopperable   Skin: no rashes or lesions  Labs/imaging that I have personally reviewed    CT right knee with extensive signs of necrotizing infection with moderate gas and large knee effusion   Resolved Hospital Problem list     Assessment & Plan:   GOC -extensive nec fasc with non-salvageable RLE as detailed below with associated septic shock -chronically unwell pt, very  frail appearing prior to OR -prognosis very guarded -- If aggressive measures were to be pursued pt would need a hip disarticulation (would need transfer to OSH to do this currently) but moreover doubt that pt is a good candidate for such interventions P: Remains DNR  Continue to communicate with family daily to determine goal of care   Severe Septic shock Septic arthritis R knee Necrotizing fasciits RLE, including R hip and R knee joints  Non-salvageable limb RLE  -5/18 POD 0 extensive debridement of RLE. Majority of all soft tissue removed. Infection had eroded through portion of superficial fem artery requiring suture by VVS  -not appropriate for AKA, would require hip disarticulation if aggressive care desired  -EBL intra op 2L however in d/w   P: Remains critically ill in ICU Continue vent support as below  Continue broad spectrum antibiotics  Follow cultures  Continue pressor support for MAP goal > 65 Trend lactic acid Monitor urine output  Acute respiratory failure, remaining intubated after surgery COPD - followed by Dr. Lamonte Sakai  P: Continue ventilator support with lung protective strategies  Wean PEEP and FiO2 for sats greater than 90%. Head of bed elevated 30 degrees. Plateau pressures less than 30 cm H20.  Follow intermittent chest x-ray and ABG.   SAT/SBT as tolerated, mentation preclude extubation  Ensure adequate pulmonary hygiene  Follow cultures  VAP bundle in place  PAD protocol  CKD III -baseline cr 2 P: Follow renal function  Monitor urine output, output remains low Trend Bmet Avoid nephrotoxins Ensure adequate renal perfusion   Metabolic Acidosis  -1amp bicarb intraop Lactic acidosis  -in setting of septic shock  -no great utility in trending LA anticipate will be high.  P: Continue Bicarb drip  Continue to treat underlying cause    Pernicious anemia  -preop hgb 8 ABLA -Post op hgb 5 -- has received 5 PRBC, 2FFP, 1plt  P: Hgb remains sable   Trend CBC  Transfuse per protocol   Hypocalcemia -ical 0.9 P: Continue to supplement, supplemented today   HTN  P: Continue to hold antihypertensives  Continuous telemetry   GERD P: Continue PPI  Lung cancer - follows with Dr Roxan Hockey  -refused oncology referral for lung ca Prostate Cancer s/p radioactive seed P Supportive care   Hx depression P: Continue home Paxil   Protein calorie malnutrition, severe P RDN consulted  Continue tube feeds   Best practice   Diet:  NPO Pain/Anxiety/Delirium protocol (if indicated): Yes (RASS goal -1) VAP protocol (if indicated): Yes DVT prophylaxis: Contraindicated GI prophylaxis: PPI Glucose control:  SSI No Central venous access:  Yes, and it is still needed Arterial line:  Yes, and it is still needed Foley:  Yes, and it is still needed Mobility:  bed rest  PT consulted: N/A Last date of multidisciplinary goals of care discussion: Ongoing  Code Status:  DNR Disposition: ICU  Critical care time:   CRITICAL CARE Performed by: Johnsie Cancel  Total  critical care time: 38 minutes  Critical care time was exclusive of separately billable procedures and treating other patients.  Critical care was necessary to treat or prevent imminent or life-threatening deterioration.  Critical care was time spent personally by me on the following activities: development of treatment plan with patient and/or surrogate as well as nursing, discussions with consultants, evaluation of patient's response to treatment, examination of patient, obtaining history from patient or surrogate, ordering and performing treatments and interventions, ordering and review of laboratory studies, ordering and review of radiographic studies, pulse oximetry and re-evaluation of patient's condition.  Johnsie Cancel, NP-C [facility] Personal contact information can be found on Amion  05/22/2020, 8:54 AM

## 2020-05-22 NOTE — Anesthesia Postprocedure Evaluation (Signed)
Anesthesia Post Note  Patient: Jerry Wise  Procedure(s) Performed: IRRIGATION AND DEBRIDEMENT RIGHT LEG (Right )     Patient location during evaluation: SICU Anesthesia Type: General Level of consciousness: sedated Pain management: pain level controlled Vital Signs Assessment: post-procedure vital signs reviewed and stable Respiratory status: patient remains intubated per anesthesia plan Cardiovascular status: stable Postop Assessment: no apparent nausea or vomiting Anesthetic complications: no   No complications documented.  Last Vitals:  Vitals:   05/22/20 0645 05/22/20 0700  BP:    Pulse: (!) 113 (!) 113  Resp: (!) 25 (!) 25  Temp: 36.9 C 36.8 C  SpO2: 100% 100%    Last Pain:  Vitals:   05/22/20 0400  TempSrc: Esophageal  PainSc:                  Barnet Glasgow

## 2020-05-22 NOTE — Plan of Care (Signed)
  Interdisciplinary Goals of Care Family Meeting   Date carried out:: 05/22/2020  Location of the meeting: Bedside  Member's involved: Merlene Laughter - Nurse Practitioner, Cybil -  Bedside Registered Nurse and Patients spouse and daughter   Durable Power of Attorney or acting medical decision maker: Shared between patients wife and daughter     Discussion: We discussed goals of care for [employer] .  The family was updated regarding patients clinical presentation and expected clinical trajectory. That family understands how critically ill Mr. Milligan is. They state that the main goal moving forward is to focus on comfort. The patients spouse would like to allow any and all extended family time to see patient prior to transition to comfort care. The family will return later today.   Tentative plan for one way extubation and eventual transition to comfort care tomorrow 5/20  Code status: Full DNR  Disposition: Continue current acute care  Time spent for the meeting: [place], NP-C [place] contact information can be found on Amion  05/22/2020, 4:55 PM

## 2020-05-22 NOTE — Progress Notes (Signed)
D/w Elink Md Dr Lucile Shutters, ABG results. Will decrease rate to 25.

## 2020-05-22 NOTE — Progress Notes (Signed)
Nutrition Follow-up  DOCUMENTATION CODES:   Severe malnutrition in context of chronic illness,Underweight  INTERVENTION:   Tube Feeding via OG:  Pivot 1.5 at 20 ml/hr today Goal Regimen: Pivot 1.5 at 55 ml/hr Provides 124 g of protein, 1980 kcals and 1003 mL of free water    NUTRITION DIAGNOSIS:   Severe Malnutrition related to chronic illness (COPD, CKD stage IIIa) as evidenced by severe fat depletion,severe muscle depletion.  Being addressed via TF   GOAL:   Patient will meet greater than or equal to 90% of their needs  Progressing  MONITOR:   Diet advancement,Labs,Weight trends,Skin  REASON FOR ASSESSMENT:   Diagnosis,Other (Comment) (underweight BMI)    ASSESSMENT:   80 year old male who presented to the ED on 5/17 with concern for L leg infection. PMH of HTN, GERD, COPD, lung cancer s/p R upper lobectomy in 2018, prostate cancer s/p radioactive seed implants, anemia, CKD stage IIIa, gout. Pt admitted with severe sepsis secondary to RLE necrotizing soft tissue infection.  5/18 Excisional debridement of RLE including subcutaneous tissue, synovium, fascia, muscle and periosteum  Pt remains on vent support post-op, in shock-remains on levophed  Per MD notes, RLE is non salvageable, hip disarticulation recommended  Abd xray with 5/18 with OG tube side port at GE junction with advancement recommended. Discussed concern with RN, per RN tube was advanced  Plan to start trickle TF today per CCM  Labs: albumin 1.4 Meds: miralax, sodium bicarb    Diet Order:   Diet Order    None      EDUCATION NEEDS:   Not appropriate for education at this time  Skin:  Skin Assessment: Skin Integrity Issues: Skin Integrity Issues:: Other (Comment) DTI: right heel Other: nec fasc to RLE-OR with massive debridment-likely needs hip disarticulation  Last BM:  05/16/2020 large type 6  Height:   Ht Readings from Last 1 Encounters:  05/12/2020 6\' 2"  (1.88 m)    Weight:   Wt  Readings from Last 1 Encounters:  05/28/2020 65.3 kg    BMI:  Body mass index is 18.48 kg/m.  Estimated Nutritional Needs:   Kcal:  1950-2150 kcals  Protein:  120-140 g  Fluid:  >/= 1.9 L  Kerman Passey MS, RDN, LDN, CNSC Registered Dietitian III Clinical Nutrition RD Pager and On-Call Pager Number Located in [place]

## 2020-05-23 DIAGNOSIS — M7989 Other specified soft tissue disorders: Secondary | ICD-10-CM | POA: Diagnosis not present

## 2020-05-23 LAB — GLUCOSE, CAPILLARY
Glucose-Capillary: 107 mg/dL — ABNORMAL HIGH (ref 70–99)
Glucose-Capillary: 110 mg/dL — ABNORMAL HIGH (ref 70–99)
Glucose-Capillary: 114 mg/dL — ABNORMAL HIGH (ref 70–99)
Glucose-Capillary: 119 mg/dL — ABNORMAL HIGH (ref 70–99)
Glucose-Capillary: 147 mg/dL — ABNORMAL HIGH (ref 70–99)

## 2020-05-23 LAB — CBC
HCT: 28.3 % — ABNORMAL LOW (ref 39.0–52.0)
Hemoglobin: 10.1 g/dL — ABNORMAL LOW (ref 13.0–17.0)
MCH: 28.4 pg (ref 26.0–34.0)
MCHC: 35.7 g/dL (ref 30.0–36.0)
MCV: 79.5 fL — ABNORMAL LOW (ref 80.0–100.0)
Platelets: 99 10*3/uL — ABNORMAL LOW (ref 150–400)
RBC: 3.56 MIL/uL — ABNORMAL LOW (ref 4.22–5.81)
RDW: 16 % — ABNORMAL HIGH (ref 11.5–15.5)
WBC: 42.3 10*3/uL — ABNORMAL HIGH (ref 4.0–10.5)
nRBC: 0 % (ref 0.0–0.2)

## 2020-05-23 LAB — POCT I-STAT 7, (LYTES, BLD GAS, ICA,H+H)
Acid-Base Excess: 2 mmol/L (ref 0.0–2.0)
Bicarbonate: 23.3 mmol/L (ref 20.0–28.0)
Calcium, Ion: 0.78 mmol/L — CL (ref 1.15–1.40)
HCT: 29 % — ABNORMAL LOW (ref 39.0–52.0)
Hemoglobin: 9.9 g/dL — ABNORMAL LOW (ref 13.0–17.0)
O2 Saturation: 100 %
Patient temperature: 36.7
Potassium: 3.6 mmol/L (ref 3.5–5.1)
Sodium: 134 mmol/L — ABNORMAL LOW (ref 135–145)
TCO2: 24 mmol/L (ref 22–32)
pCO2 arterial: 25.9 mmHg — ABNORMAL LOW (ref 32.0–48.0)
pH, Arterial: 7.561 — ABNORMAL HIGH (ref 7.350–7.450)
pO2, Arterial: 158 mmHg — ABNORMAL HIGH (ref 83.0–108.0)

## 2020-05-23 LAB — MAGNESIUM: Magnesium: 1.3 mg/dL — ABNORMAL LOW (ref 1.7–2.4)

## 2020-05-23 LAB — BASIC METABOLIC PANEL
Anion gap: 17 — ABNORMAL HIGH (ref 5–15)
BUN: 98 mg/dL — ABNORMAL HIGH (ref 8–23)
CO2: 20 mmol/L — ABNORMAL LOW (ref 22–32)
Calcium: 6.8 mg/dL — ABNORMAL LOW (ref 8.9–10.3)
Chloride: 95 mmol/L — ABNORMAL LOW (ref 98–111)
Creatinine, Ser: 2.19 mg/dL — ABNORMAL HIGH (ref 0.61–1.24)
GFR, Estimated: 30 mL/min — ABNORMAL LOW (ref >60)
Glucose, Bld: 119 mg/dL — ABNORMAL HIGH (ref 70–99)
Potassium: 3.5 mmol/L (ref 3.5–5.1)
Sodium: 132 mmol/L — ABNORMAL LOW (ref 135–145)

## 2020-05-23 LAB — PHOSPHORUS: Phosphorus: 5.3 mg/dL — ABNORMAL HIGH (ref 2.5–4.6)

## 2020-05-23 MED ORDER — GLYCOPYRROLATE 1 MG PO TABS
1.0000 mg | ORAL_TABLET | ORAL | Status: DC | PRN
Start: 2020-05-23 — End: 2020-05-25

## 2020-05-23 MED ORDER — GLYCOPYRROLATE 0.2 MG/ML IJ SOLN: 0.2000 mg | INTRAMUSCULAR | Status: DC | PRN

## 2020-05-23 MED ORDER — DIPHENHYDRAMINE HCL 50 MG/ML IJ SOLN: 25.0000 mg | INTRAMUSCULAR | Status: DC | PRN

## 2020-05-23 MED ORDER — GLYCOPYRROLATE 0.2 MG/ML IJ SOLN
0.2000 mg | INTRAMUSCULAR | Status: DC | PRN
  Administered 2020-05-23: 0.2 mg via INTRAVENOUS
  Filled 2020-05-23: qty 1

## 2020-05-23 MED ORDER — IPRATROPIUM-ALBUTEROL 0.5-2.5 (3) MG/3ML IN SOLN: 3.0000 mL | RESPIRATORY_TRACT | Status: DC | PRN

## 2020-05-23 MED ORDER — DEXTROSE 5 % IV SOLN: INTRAVENOUS | Status: DC

## 2020-05-23 MED ORDER — POLYVINYL ALCOHOL 1.4 % OP SOLN: 1.0000 [drp] | Freq: Four times a day (QID) | OPHTHALMIC | Status: DC | PRN

## 2020-05-23 MED ORDER — SCOPOLAMINE 1 MG/3DAYS TD PT72
1.0000 | MEDICATED_PATCH | TRANSDERMAL | Status: DC
  Administered 2020-05-23: 1.5 mg via TRANSDERMAL
  Filled 2020-05-23: qty 1

## 2020-05-23 MED ORDER — LORAZEPAM 2 MG/ML IJ SOLN
2.0000 mg | Freq: Two times a day (BID) | INTRAMUSCULAR | Status: DC | PRN
  Administered 2020-05-23: 4 mg via INTRAVENOUS
  Filled 2020-05-23: qty 2

## 2020-05-23 MED ORDER — MAGNESIUM SULFATE 2 GM/50ML IV SOLN
2.0000 g | Freq: Once | INTRAVENOUS | Status: AC
  Administered 2020-05-23: 2 g via INTRAVENOUS
  Filled 2020-05-23: qty 50

## 2020-05-23 MED ORDER — POTASSIUM CHLORIDE 10 MEQ/50ML IV SOLN
10.0000 meq | INTRAVENOUS | Status: AC
  Administered 2020-05-23 (×3): 10 meq via INTRAVENOUS
  Filled 2020-05-23 (×3): qty 50

## 2020-05-23 NOTE — Plan of Care (Signed)
PCCM Progress Note   Notified by nursing that all family members have had the opportunity to visit Mr. Fairbank and the family Is ready to proceed with one way extubation. Family was educated on process and all questions were answered. Comfort ordered including extubation was ordered.   Johnsie Cancel, NP-C [facility] Personal contact information can be found on Amion  05/23/2020, 5:25 PM

## 2020-05-23 NOTE — Procedures (Signed)
Extubation Procedure Note  Patient Details:   Name: Jerry Wise DOB: Jul 08, 1940 MRN: [id number]   Airway Documentation:    Vent end date: 05/23/20 Vent end time: 1821   Evaluation  O2 sats: stable throughout Complications: No apparent complications Patient did tolerate procedure well. Bilateral Breath Sounds: Clear,Diminished   No.  Pt extubated to room air per physician's order and in accordance with the family's wishes.  Family at bedside for extubation.  Earney Navy 05/23/2020, 6:21 PM

## 2020-05-23 NOTE — Progress Notes (Signed)
NAME:  Jerry Wise, MRN:  [id number], DOB:  01/11/1940, LOS: 3 ADMISSION DATE:  05/30/2020, CONSULTATION DATE:  05/23/20 REFERRING MD:  EDP CHIEF COMPLAINT:   L leg infection  History of Present Illness:  Jerry Wise is a 80 y.o. M with PMH significant for COPD not on home O2, CKD, Gout, prostate Ca, RUL neoplasm under surveillance, HTN who started developing a swollen R knee approximately one week ago.   Denies any trauma, cuts or abrasions.  He developed worsening swelling and redness and was evaluated by PCP where SBP was in the 70's, so referred to the ED.  He denies any fevers or chills, but has had generalized malaise and poor appetite.  He was on Levaquin two weeks ago for UTI.     In the ED, blood pressure improved with IVF, he was given Vancomycin and Cefepime and labs revealed WBC of 61k, lactic acid 6.6, creatinine 2.3 (baseline 1.4-1.6), bedside US with large fluid collection.  ED provider spoke with ortho who opted not to perform arthrocentesis secondary to overlying cellulitis.  MRI of the knee ordered.  PCCM consulted for hypotension.  At the time of evaluation, pt awake and interactive with MAP of 66 after ~2.5L IVF.  5/18 PCCM reconsulted after pt operative course in which pt had significant excision/debridement of RLE nec fasc, and remains intubated and on pressors following case.   Pertinent  Medical History    has a past medical history of Asthma, Cancer ([place]), Chronic renal insufficiency, Dyspnea, GERD (gastroesophageal reflux disease), Gout, Gouty arthritis, History of kidney stones, History of prostate cancer, Hydronephrosis, bilateral, Hypertension, Parathyroid abnormality ([place]), Pernicious anemia (FOLLOWED BY DR WEBB), and Urethral stricture.   Significant Hospital Events: Including procedures, antibiotic start and stop dates in addition to other pertinent events   . 5/17 Presented to ED, treated for sepsis with IVF, abx, BP improved.  PCCM consulted and recommend admission  to the floor . 5/18 OR with ortho for R knee I&D. Converted to substantial debridement of nec fasc. On pressors, to ICU intubated following case  . 5/19 Family updated later in the evening and decision made for DNR status   Interim History / Subjective:  No acute events overnight Urine output further decreased, ~275 in the last 24hrs  Patient is more awake on vent this am   Objective   Blood pressure (!) 153/49, pulse (!) 102, temperature 98.6 F (37 C), resp. rate 20, height 6\' 2"  (1.88 m), weight 72.5 kg, SpO2 100 %.    Vent Mode: PRVC FiO2 (%):  [35 %] 35 % Set Rate:  [20 bmp-650 bmp] 20 bmp Vt Set:  [20 mL-650 mL] 650 mL PEEP:  [8 cmH20] 8 cmH20 Plateau Pressure:  [20 cmH20-22 cmH20] 22 cmH20   Intake/Output Summary (Last 24 hours) at 05/23/2020 0701 Last data filed at 05/23/2020 0600 Gross per 24 hour  Intake 3141.94 ml  Output 275 ml  Net 2866.94 ml   Filed Weights   05/31/2020 2324 05/27/2020 1249 05/23/20 0412  Weight: 65.3 kg 65.3 kg 72.5 kg   Physical Exam General: Acute on chronic ill appearing elderly male lying in bed on mechanical ventilation, in NAD HEENT: ETT, MM pink/moist, PERRL,  Neuro: Will open eyes to verbal stimuli, sedated on vent, unable to follow commands  CV: s1s2 regular rate and rhythm, no murmur, rubs, or gallops,  PULM:  Clear to ascultation, no added breath sounds, no increased work of breathing  GI: soft, bowel sounds active in  all 4 quadrants, non-tender, non-distended, tolerating TF Extremities: warm/dry, no edema  Skin: no rashes or lesions  Labs/imaging that I have personally reviewed    CT right knee with extensive signs of necrotizing infection with moderate gas and large knee effusion   Resolved Hospital Problem list     Assessment & Plan:   GOC -extensive nec fasc with non-salvageable RLE as detailed below with associated septic shock -chronically unwell pt, very frail appearing prior to OR -prognosis very guarded -- If  aggressive measures were to be pursued pt would need a hip disarticulation (would need transfer to OSH to do this currently) but moreover doubt that pt is a good candidate for such interventions P: Tentative decision was made to transition to comfort care with one way extubation 5/20 Unrestricted visitation Supportive care   Severe Septic shock Septic arthritis R knee Necrotizing fasciits RLE, including R hip and R knee joints  Non-salvageable limb RLE  -5/18 POD 0 extensive debridement of RLE. Majority of all soft tissue removed. Infection had eroded through portion of superficial fem artery requiring suture by VVS  -not appropriate for AKA, would require hip disarticulation if aggressive care desired  -EBL intra op 2L however in d/w   P: Remains critically ill in ICU  Vent support as below  Remains on broad spectrum antibiotics, will stop if comfort measures started Remains on low dose Levo for MAP goal > 65  Acute respiratory failure, remaining intubated after surgery COPD - followed by Dr. Lamonte Sakai  P: Continue ventilator support with lung protective strategies  Trial an SBT prior to one way extubation, if fails will ensure opoid drip is in place prior to extubation to avoid air hunger  Wean PEEP and FiO2 for sats greater than 90%. Head of bed elevated 30 degrees. Plateau pressures less than 30 cm H20.  Follow intermittent chest x-ray and ABG.   Ensure adequate pulmonary hygiene  Follow cultures  VAP bundle in place  PAD protocol  CKD III -baseline cr 2 P: Urine output severely decreased, impending renal failure likely  Goal for comfort care today  Follow renal function / urine output Trend Bmet Avoid nephrotoxins Ensure adequate renal perfusion   Metabolic Acidosis  -1amp bicarb intraop Lactic acidosis  -in setting of septic shock  -no great utility in trending LA anticipate will be high.  P: Sodium bicarb stopped 5/19   Pernicious anemia  -preop hgb  8 ABLA -Post op hgb 5 -- has received 5 PRBC, 2FFP, 1plt  P: Hgb remains stable  Trend CBC  Transfuse per protocol   Hypocalcemia -ical 0.9 P: Supplement as needed   HTN  P: Home medications on hold Continuous telemetry   GERD P: Continue PPI  Lung cancer - follows with Dr Roxan Hockey  -refused oncology referral for lung ca Prostate Cancer s/p radioactive seed P Supportive care  Hx depression P: Home Paxil  Protein calorie malnutrition, severe P Tube feeds started  Dietitian following   Best practice   Diet:  NPO Pain/Anxiety/Delirium protocol (if indicated): Yes (RASS goal -1) VAP protocol (if indicated): Yes DVT prophylaxis: Contraindicated GI prophylaxis: PPI Glucose control:  SSI No Central venous access:  Yes, and it is still needed Arterial line:  Yes, and it is still needed Foley:  Yes, and it is still needed Mobility:  bed rest  PT consulted: N/A Last date of multidisciplinary goals of care discussion: Tentative plan for transition to comfort care 5/20 with one way extubation pending further conversation  with family  Code Status:  DNR Disposition: ICU  Critical care time:   CRITICAL CARE Performed by: Johnsie Cancel  Total critical care time: 36 minutes  Critical care time was exclusive of separately billable procedures and treating other patients.  Critical care was necessary to treat or prevent imminent or life-threatening deterioration.  Critical care was time spent personally by me on the following activities: development of treatment plan with patient and/or surrogate as well as nursing, discussions with consultants, evaluation of patient's response to treatment, examination of patient, obtaining history from patient or surrogate, ordering and performing treatments and interventions, ordering and review of laboratory studies, ordering and review of radiographic studies, pulse oximetry and re-evaluation of patient's condition.  Johnsie Cancel, NP-C  [facility] Personal contact information can be found on Amion  05/23/2020, 7:01 AM

## 2020-05-23 NOTE — Progress Notes (Signed)
eLink Physician-Brief Progress Note Patient Name: RAYBURN MUNDIS DOB: [birth year] MRN: [id number]   Date of Service  05/23/2020  HPI/Events of Note  Hypokalemia  Hypomagnesemia - K+ = 3.5, Mg++ = 1.3 and Creatinine = 2.19.Marland Kitchen  eICU Interventions  Will replace K+ and Mg++.     Intervention Category Major Interventions: Electrolyte abnormality - evaluation and management  Cruz Bong Eugene 05/23/2020, 4:52 AM

## 2020-05-23 NOTE — Progress Notes (Signed)
This chaplain responded to [facility] chart note requesting information for Pt. grandson-Donavan to visit. The chaplain understands Acie Fredrickson is incarcerated and the Pt. main goal moving forward is comfort.  The chaplain was updated by the Pt. RN-Roy before the phone call. The chaplain phoned the Pt. daughter-Darlene Harbeson at [phone] for more information about the correctional facility and the inmate number.  A voicemail was left at the number above.    This chaplain is available for F/U spiritual care as needed.

## 2020-05-23 NOTE — Plan of Care (Signed)
  Problem: Education: Goal: Knowledge of General Education information will improve Description: Including pain rating scale, medication(s)/side effects and non-pharmacologic comfort measures Outcome: Progressing   Problem: Health Behavior/Discharge Planning: Goal: Ability to manage health-related needs will improve Outcome: Not Applicable   Problem: Clinical Measurements: Goal: Ability to maintain clinical measurements within normal limits will improve Outcome: Not Applicable Goal: Will remain free from infection Outcome: Not Applicable Goal: Diagnostic test results will improve Outcome: Not Applicable Goal: Respiratory complications will improve Outcome: Not Applicable Goal: Cardiovascular complication will be avoided Outcome: Not Applicable   Problem: Elimination: Goal: Will not experience complications related to bowel motility Outcome: Progressing Goal: Will not experience complications related to urinary retention Outcome: Progressing   Problem: Pain Managment: Goal: General experience of comfort will improve Outcome: Progressing   Problem: Safety: Goal: Ability to remain free from injury will improve Outcome: Progressing   Problem: Skin Integrity: Goal: Risk for impaired skin integrity will decrease Outcome: Not Progressing  Will continue to maintain comfort and dignity for patient and family, Pt is Comfort care

## 2020-05-24 DIAGNOSIS — M7989 Other specified soft tissue disorders: Secondary | ICD-10-CM | POA: Diagnosis not present

## 2020-05-24 LAB — ACID FAST SMEAR (AFB, MYCOBACTERIA): Acid Fast Smear: NEGATIVE

## 2020-05-25 LAB — TYPE AND SCREEN
ABO/RH(D): O POS
Antibody Screen: NEGATIVE
Unit division: 0
Unit division: 0
Unit division: 0
Unit division: 0
Unit division: 0
Unit division: 0
Unit division: 0

## 2020-05-25 LAB — BPAM RBC
Blood Product Expiration Date: 202206142359
Blood Product Expiration Date: 202206152359
Blood Product Expiration Date: 202206152359
Blood Product Expiration Date: 202206152359
Blood Product Expiration Date: 202206152359
Blood Product Expiration Date: 202206162359
Blood Product Expiration Date: 202206172359
ISSUE DATE / TIME: 202205181658
ISSUE DATE / TIME: 202205181658
ISSUE DATE / TIME: 202205181832
ISSUE DATE / TIME: 202205181832
ISSUE DATE / TIME: 202205181832
Unit Type and Rh: 5100
Unit Type and Rh: 5100
Unit Type and Rh: 5100
Unit Type and Rh: 5100
Unit Type and Rh: 5100
Unit Type and Rh: 5100
Unit Type and Rh: 5100

## 2020-05-25 LAB — CULTURE, BLOOD (ROUTINE X 2)
Culture: NO GROWTH
Culture: NO GROWTH
Special Requests: ADEQUATE
Special Requests: ADEQUATE

## 2020-05-25 LAB — CALCIUM, IONIZED: Calcium, Ionized, Serum: 4.1 mg/dL — ABNORMAL LOW (ref 4.5–5.6)

## 2020-05-27 LAB — AEROBIC/ANAEROBIC CULTURE W GRAM STAIN (SURGICAL/DEEP WOUND)

## 2020-05-27 LAB — BODY FLUID CULTURE W GRAM STAIN

## 2020-06-04 NOTE — Progress Notes (Signed)
Received call from patient's daughter, Carlyon Shadow with requested information regarding request for compassionate visit from patient grandson.  His name is Vandell Kun and he is currently at [facility].  His number is [phone].  I told her that I would reach out to on call chaplain to see if they are aware of process and could possibly facilitate.  Micheline Rough, MD [facility] Team [phone]  NO CHARGE NOTE

## 2020-06-04 NOTE — Death Summary Note (Addendum)
  DEATH SUMMARY   Patient Details  Name: Jerry Wise MRN: [id number] DOB: May 17, 1940  Admission/Discharge Information   Admit Date:  06-16-20  Date of Death: Date of Death: 2020/06/20  Time of Death: Time of Death: 1803/04/10  Length of Stay: 4  Referring Physician: Josetta Huddle, MD   Reason(s) for Hospitalization  Leg infection Sepsis present on admission Septic shock   Diagnoses  Preliminary cause of death:   Necrotizing fasciitis Septic shock Multiorgan failure Acute kidney injury  Secondary Diagnoses (including complications and co-morbidities):  Principal Problem:   Necrotizing soft tissue infection Active Problems:   Malignant neoplasm of right upper lobe of lung ([place])   Tobacco use   Prostate cancer ([place])   Hypertension   S/P lobectomy of lung   Lung nodule   COPD (chronic obstructive pulmonary disease) ([facility])   Severe sepsis ([facility])   Catheter-associated urinary tract infection ([place])   AKI (acute kidney injury) ([place])   Metabolic acidosis   Protein-calorie malnutrition, severe   Acute respiratory failure ([place])   Acute blood loss anemia ([facility]) Palliative care consult  Brief Hospital Course (including significant findings, care, treatment, and services provided and events leading to death)  Kaylor L Dauria is a 80 y.o. year old male  with PMH significant for COPD not on home O2, CKD, Gout, prostate Ca, RUL neoplasm under surveillance, HTN. Admitted with right lower extremity infection, presenting with sepsis, septic shock and multiorgan failure with acute kidney.  Taken to the OR by Ortho on 5/18 for right knee I&D.  It was discovered that he has substantial necrotizing fasciitis and operation was converted to department.  Return to ICU on pressors and intubated.  It was determined that his right lower extremity is nonsalvageable, he has very poor prognosis for recovery.  Palliative care consulted. After family meeting the decision was made the decision to transition to  comfort measures.  He was terminally extubated on 2022/06/20 and passed away on 2022/06/21  Marshell Garfinkel MD [facility] 05/25/2020, 8:00 AM

## 2020-06-04 NOTE — Progress Notes (Addendum)
NAME:  Jerry Wise, MRN:  [id number], DOB:  1940/11/06, LOS: 4 ADMISSION DATE:  05/04/2020, CONSULTATION DATE:  June 17, 2020 REFERRING MD:  EDP CHIEF COMPLAINT:   L leg infection  History of Present Illness:  Jerry Wise is a 80 y.o. M with PMH significant for COPD not on home O2, CKD, Gout, prostate Ca, RUL neoplasm under surveillance, HTN who started developing a swollen R knee approximately one week ago.   Denies any trauma, cuts or abrasions.  He developed worsening swelling and redness and was evaluated by PCP where SBP was in the 70's, so referred to the ED.  He denies any fevers or chills, but has had generalized malaise and poor appetite.  He was on Levaquin two weeks ago for UTI.     In the ED, blood pressure improved with IVF, he was given Vancomycin and Cefepime and labs revealed WBC of 61k, lactic acid 6.6, creatinine 2.3 (baseline 1.4-1.6), bedside US with large fluid collection.  ED provider spoke with ortho who opted not to perform arthrocentesis secondary to overlying cellulitis.  MRI of the knee ordered.  PCCM consulted for hypotension.  At the time of evaluation, pt awake and interactive with MAP of 66 after ~2.5L IVF.  5/18 PCCM reconsulted after pt operative course in which pt had significant excision/debridement of RLE nec fasc, and remains intubated and on pressors following case.   Pertinent  Medical History    has a past medical history of Asthma, Cancer ([place]), Chronic renal insufficiency, Dyspnea, GERD (gastroesophageal reflux disease), Gout, Gouty arthritis, History of kidney stones, History of prostate cancer, Hydronephrosis, bilateral, Hypertension, Parathyroid abnormality ([place]), Pernicious anemia (FOLLOWED BY DR WEBB), and Urethral stricture.   Significant Hospital Events: Including procedures, antibiotic start and stop dates in addition to other pertinent events   . 5/17 Presented to ED, treated for sepsis with IVF, abx, BP improved.  PCCM consulted and recommend admission  to the floor . 5/18 OR with ortho for R knee I&D. Converted to substantial debridement of nec fasc. On pressors, to ICU intubated following case  . 5/19 Family updated later in the evening and decision made for DNR status  . 5/20 Comfort measures  Interim History / Subjective:   Transition to comfort measures yesterday  Objective   Blood pressure (!) 153/49, pulse 86, temperature 99 F (37.2 C), temperature source Axillary, resp. rate 18, height 6\' 2"  (1.88 m), weight 72.5 kg, SpO2 (!) 53 %.    Vent Mode: PRVC FiO2 (%):  [35 %] 35 % Set Rate:  [20 bmp] 20 bmp Vt Set:  [650 mL] 650 mL PEEP:  [8 cmH20] 8 cmH20 Plateau Pressure:  [21 cmH20-22 cmH20] 21 cmH20   Intake/Output Summary (Last 24 hours) at 06/17/20 0942 Last data filed at 06-17-20 0700 Gross per 24 hour  Intake 1524.78 ml  Output 385 ml  Net 1139.78 ml   Filed Weights   05/19/2020 2324 05/26/2020 1249 05/23/20 0412  Weight: 65.3 kg 65.3 kg 72.5 kg   Physical Exam Blood pressure (!) 153/49, pulse 86, temperature 99 F (37.2 C), temperature source Axillary, resp. rate 18, height 6\' 2"  (1.88 m), weight 72.5 kg, SpO2 (!) 53 %. Gen:      No acute distress, chronically ill-appearing HEENT:  EOMI, sclera anicteric Neck:     No masses; no thyromegaly Lungs:    Clear to auscultation bilaterally; normal respiratory effort CV:         Regular rate and rhythm; no murmurs Abd:      +  bowel sounds; soft, non-tender; no palpable masses, no distension Ext: Right leg in bandage Skin:      Warm and dry; no rash Neuro: Unresponsive  Labs/imaging that I have personally reviewed    CT right knee with extensive signs of necrotizing infection with moderate gas and large knee effusion   Resolved Hospital Problem list     Assessment & Plan:   GOC -extensive nec fasc with non-salvageable RLE as detailed below with associated septic shock -chronically unwell pt, very frail appearing prior to OR -prognosis very guarded -- If  aggressive measures were to be pursued pt would need a hip disarticulation (would need transfer to OSH to do this currently) but moreover doubt that pt is a good candidate for such interventions P: Transitioned to comfort care with one way extubation 5/20 Unrestricted visitation Supportive care   Severe Septic shock Septic arthritis R knee Necrotizing fasciits RLE, including R hip and R knee joints  Non-salvageable limb RLE  -5/18 POD 0 extensive debridement of RLE. Majority of all soft tissue removed. Infection had eroded through portion of superficial fem artery requiring suture by VVS  -not appropriate for AKA, would require hip disarticulation if aggressive care desired  -EBL intra op 2L however in d/w   P: Off pressors, antibiotics stopped DC A-line   Protein calorie malnutrition, severe P Of feeds due to comfort measure measures  Best practice   Diet:  NPO Pain/Anxiety/Delirium protocol (if indicated): No VAP protocol (if indicated): Not indicated DVT prophylaxis: Contraindicated GI prophylaxis: N/A and PPI Glucose control:  SSI No Central venous access:  N/A Arterial line:  Yes, and it is no longer needed Foley:  N/A Mobility:  bed rest  PT consulted: N/A Last date of multidisciplinary goals of care discussion: 5/20  Code Status:  Comfort measures Disposition: Transfer out of ICU  Critical care time: NA    Jerry Garfinkel MD [facility] See Amion for pager  If no response to pager , please call [phone] until 7pm After 7:00 pm call Elink  [phone] 06/16/2020, 9:47 AM

## 2020-06-04 DEATH — deceased

## 2020-06-18 LAB — FUNGUS CULTURE RESULT

## 2020-06-18 LAB — FUNGUS CULTURE WITH STAIN

## 2020-06-18 LAB — FUNGAL ORGANISM REFLEX

## 2020-06-19 LAB — FUNGAL ORGANISM REFLEX

## 2020-06-19 LAB — FUNGUS CULTURE WITH STAIN

## 2020-06-19 LAB — FUNGUS CULTURE RESULT

## 2020-07-06 LAB — ACID FAST CULTURE WITH REFLEXED SENSITIVITIES (MYCOBACTERIA): Acid Fast Culture: NEGATIVE

## 2020-07-08 LAB — ACID FAST CULTURE WITH REFLEXED SENSITIVITIES (MYCOBACTERIA): Acid Fast Culture: NEGATIVE

## 2020-09-17 ENCOUNTER — Ambulatory Visit: Payer: Medicare HMO | Admitting: Internal Medicine
